# Patient Record
Sex: Female | Born: 1955 | Race: White | Hispanic: No | Smoking: Current every day smoker
Health system: Southern US, Community
[De-identification: ages and names within clinical notes are randomized; demographics above are authoritative.]

## PROBLEM LIST (undated history)

## (undated) DIAGNOSIS — R635 Abnormal weight gain: Secondary | ICD-10-CM

## (undated) DIAGNOSIS — E7800 Pure hypercholesterolemia, unspecified: Secondary | ICD-10-CM

## (undated) DIAGNOSIS — R000 Tachycardia, unspecified: Secondary | ICD-10-CM

## (undated) HISTORY — DX: Abnormal weight gain: R63.5

---

## 1987-07-23 HISTORY — PX: CHOLECYSTECTOMY: SHX55

## 1988-07-22 HISTORY — PX: ABDOMINAL HYSTERECTOMY: SHX81

## 2005-07-22 HISTORY — PX: ROTATOR CUFF REPAIR: SHX139

## 2009-07-22 HISTORY — PX: SKIN CANCER EXCISION: SHX779

## 2013-10-25 ENCOUNTER — Ambulatory Visit: Payer: Self-pay | Admitting: Family Medicine

## 2014-08-18 IMAGING — CR LEFT WRIST - COMPLETE 3+ VIEW
1 series · 4 of 4 positions shown · non-contrast
Comparison: None.

CLINICAL DATA: Pain and swelling.

EXAM:
LEFT WRIST - COMPLETE 3+ VIEW

[Series 1: pa · 0.17mm/px · 4 of 4 slices shown]
[im 1/4]
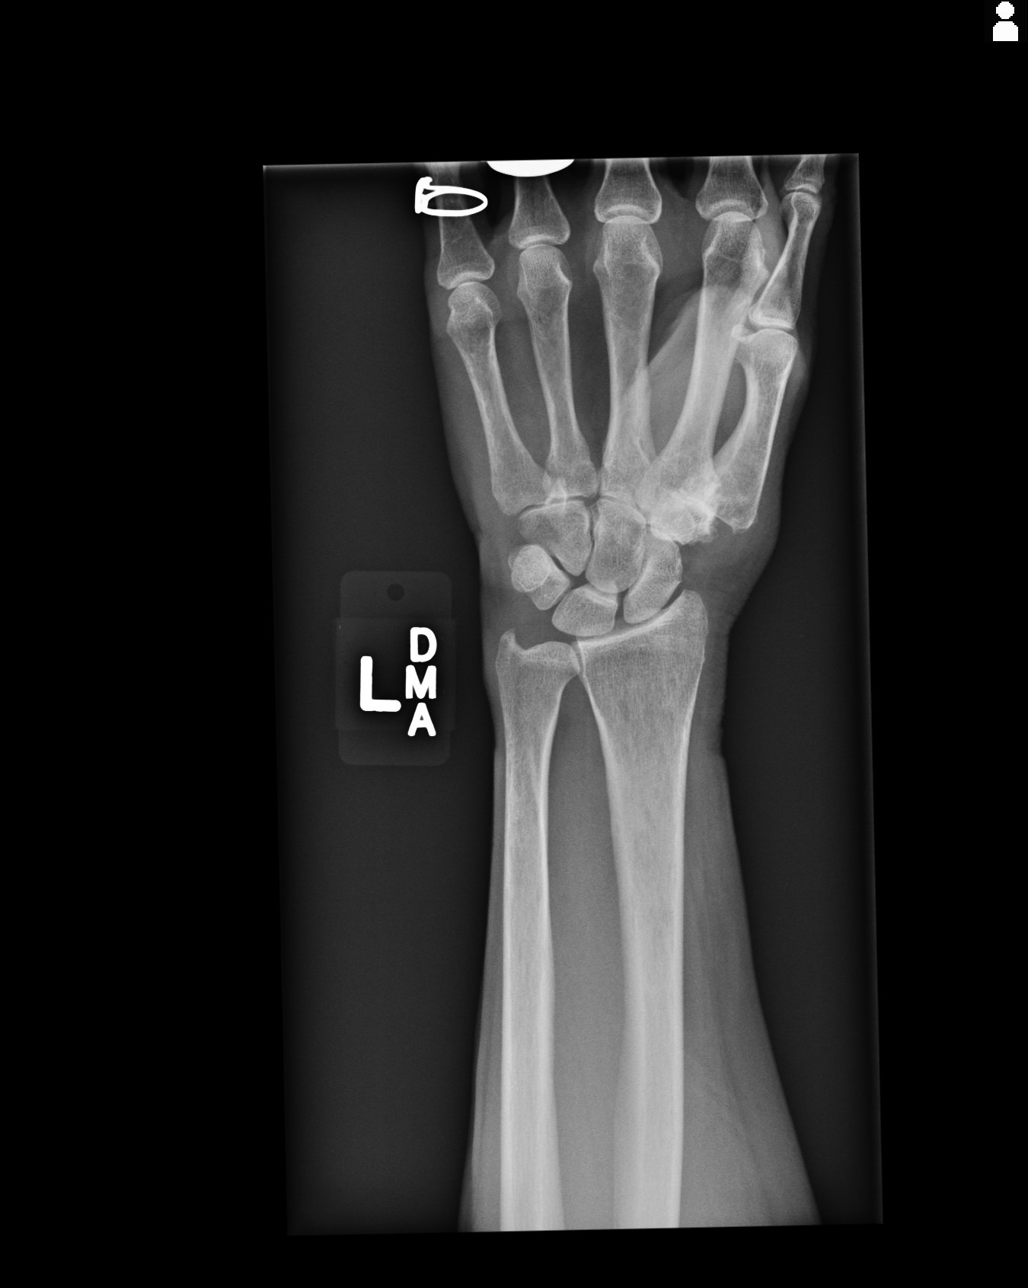
[im 2/4]
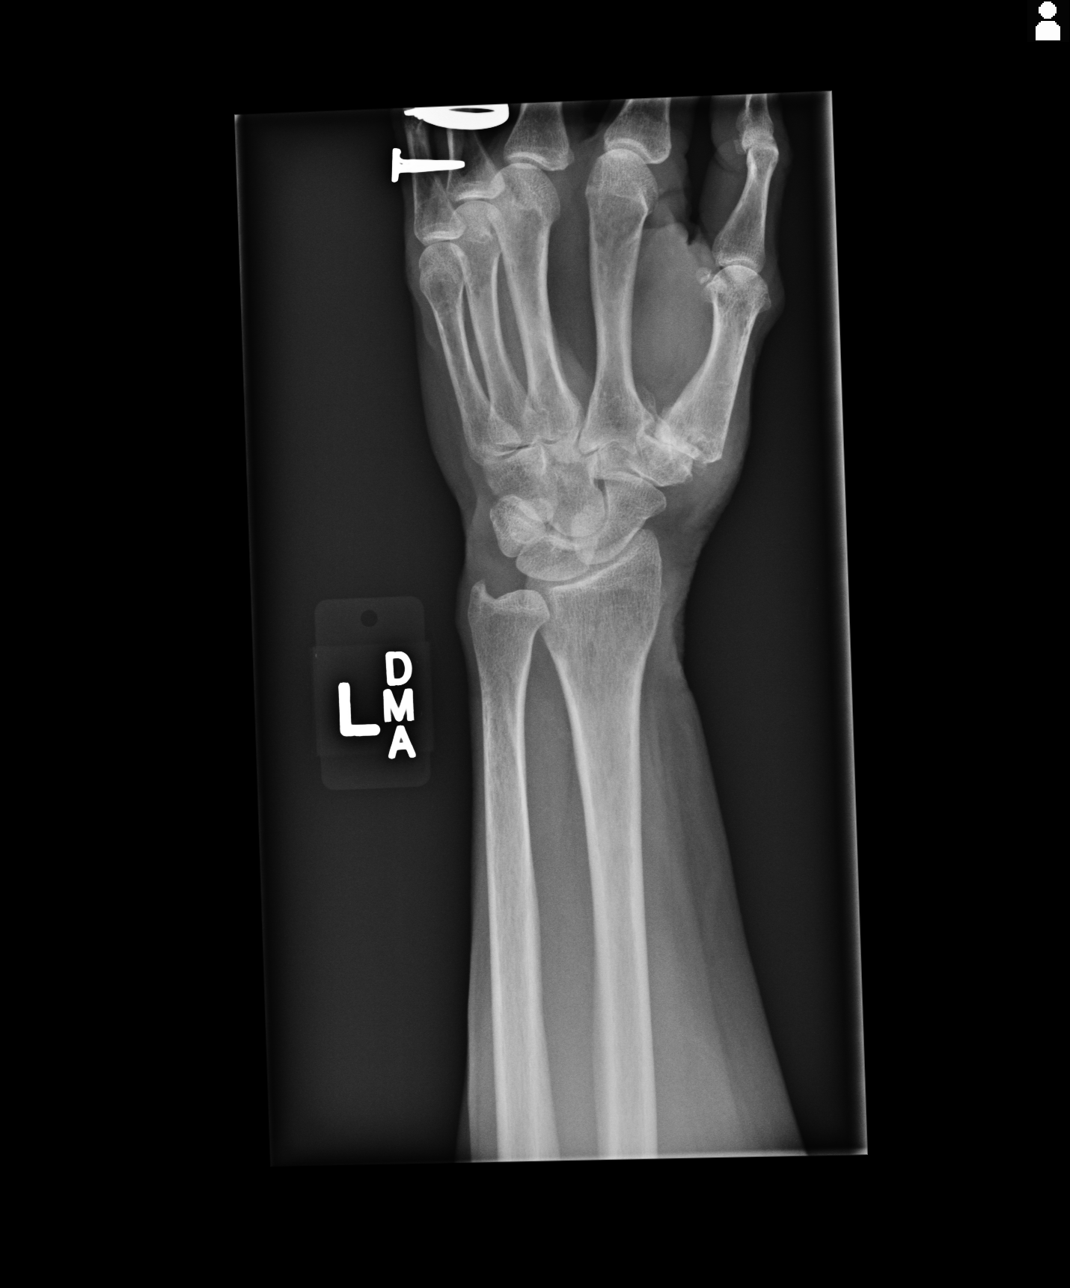
[im 3/4]
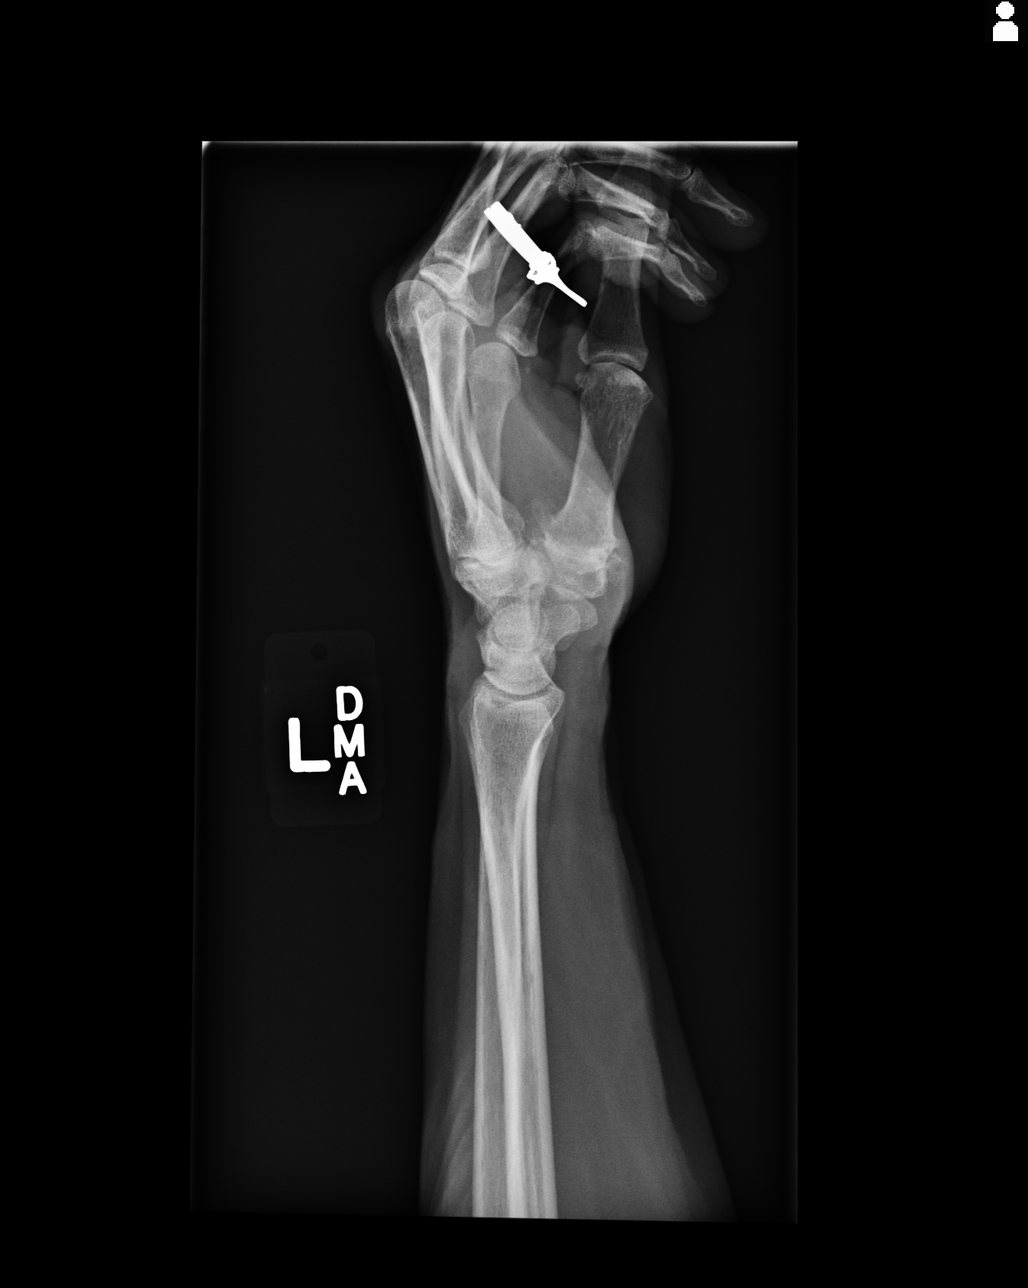
[im 4/4]
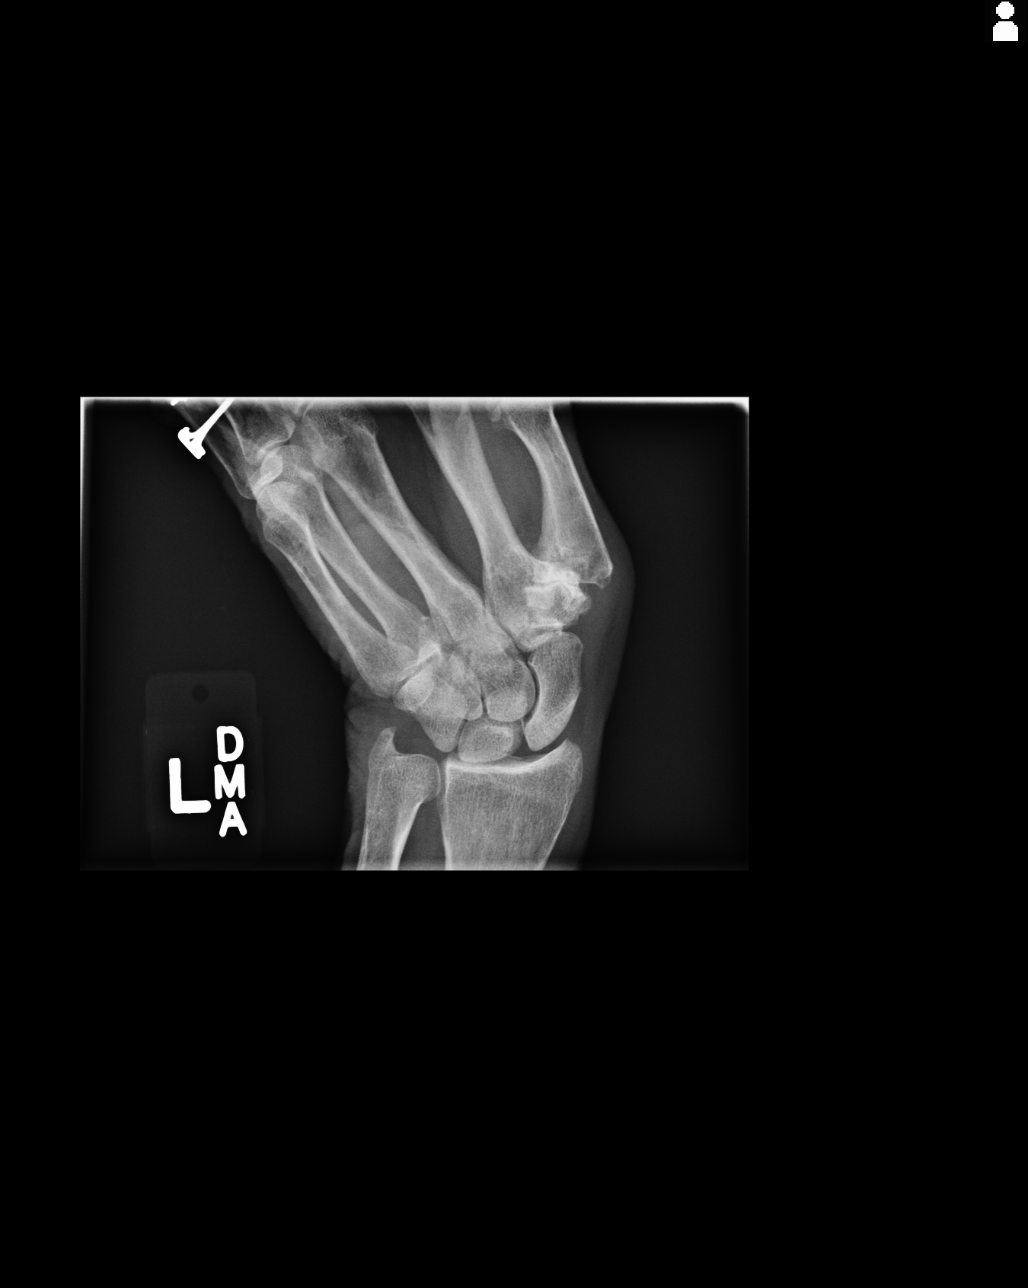

[4 of 4 positions shown; findings below may reference images not displayed]

FINDINGS: Prominent degenerative change noted in the first carpometacarpal
joint. No evidence of fracture or dislocation.
IMPRESSION: Severe degenerative changes first carpometacarpal joint. No acute
abnormality.

## 2015-01-16 DIAGNOSIS — R5383 Other fatigue: Secondary | ICD-10-CM | POA: Insufficient documentation

## 2015-01-16 DIAGNOSIS — L659 Nonscarring hair loss, unspecified: Secondary | ICD-10-CM | POA: Insufficient documentation

## 2015-01-16 DIAGNOSIS — E782 Mixed hyperlipidemia: Secondary | ICD-10-CM | POA: Insufficient documentation

## 2015-01-16 DIAGNOSIS — R635 Abnormal weight gain: Secondary | ICD-10-CM

## 2015-01-16 DIAGNOSIS — M129 Arthropathy, unspecified: Secondary | ICD-10-CM | POA: Insufficient documentation

## 2015-01-16 DIAGNOSIS — R946 Abnormal results of thyroid function studies: Secondary | ICD-10-CM | POA: Insufficient documentation

## 2015-01-16 DIAGNOSIS — I7300 Raynaud's syndrome without gangrene: Secondary | ICD-10-CM | POA: Insufficient documentation

## 2015-01-16 DIAGNOSIS — I10 Essential (primary) hypertension: Secondary | ICD-10-CM | POA: Insufficient documentation

## 2015-01-16 HISTORY — DX: Abnormal weight gain: R63.5

## 2015-04-11 ENCOUNTER — Encounter: Payer: Self-pay | Admitting: Family Medicine

## 2015-04-11 ENCOUNTER — Ambulatory Visit (INDEPENDENT_AMBULATORY_CARE_PROVIDER_SITE_OTHER): Payer: Self-pay | Admitting: Family Medicine

## 2015-04-11 VITALS — BP 122/76 | HR 79 | Temp 98.1°F | Resp 16 | Wt 187.4 lb

## 2015-04-11 DIAGNOSIS — E782 Mixed hyperlipidemia: Secondary | ICD-10-CM

## 2015-04-11 DIAGNOSIS — L930 Discoid lupus erythematosus: Secondary | ICD-10-CM

## 2015-04-11 DIAGNOSIS — E038 Other specified hypothyroidism: Secondary | ICD-10-CM

## 2015-04-11 DIAGNOSIS — E780 Pure hypercholesterolemia: Secondary | ICD-10-CM

## 2015-04-11 DIAGNOSIS — I10 Essential (primary) hypertension: Secondary | ICD-10-CM

## 2015-04-11 NOTE — Progress Notes (Signed)
Patient ID: Katelyn Bartlett, female   DOB: 08-20-1955, 59 y.o.   MRN: [id number]   Chief Complaint  Patient presents with  . Hypertension  . Hyperlipidemia  . Hypothyroidism    Subjective:  Hypertension The problem is unchanged. The problem is controlled. Agents associated with hypertension include thyroid hormones. Risk factors for coronary artery disease include smoking/tobacco exposure and stress. Past treatments include beta blockers and calcium channel blockers. There are no compliance problems.   Hyperlipidemia The problem is controlled. Exacerbating diseases include hypothyroidism. Factors aggravating her hyperlipidemia include smoking and beta blockers. Current antihyperlipidemic treatment includes exercise and statins. There are no compliance problems.  Risk factors for coronary artery disease include hypertension.  Hypothyroidism Still on the Levothyroxine 25 mg qd and energy level better. Has gained 6 lbs since last OV in May 2016. Still noting thinning of eyelashes, eyebrows and loss of axillary, arm and leg hair over the past 1 1/2 years. Has started walking 1 mile in 22 minutes every day over the past 3-4 months.   Prior to Admission medications   Medication Sig Start Date End Date Taking? Authorizing Provider  amLODipine (NORVASC) 5 MG tablet Take 1 tablet by mouth daily. 10/11/14  Yes Historical Provider, MD  aspirin 81 MG tablet Take 1 tablet by mouth daily.   Yes Historical Provider, MD  levothyroxine (SYNTHROID, LEVOTHROID) 25 MCG tablet Take 25 mcg by mouth daily before breakfast.   Yes Historical Provider, MD  metoprolol tartrate (LOPRESSOR) 25 MG tablet Take 1 tablet by mouth daily. 10/11/14  Yes Historical Provider, MD  MULTIPLE VITAMIN PO Take 1 tablet by mouth daily.   Yes Historical Provider, MD  naproxen sodium (ALEVE) 220 MG tablet Take 1 tablet by mouth as needed.    Yes Historical Provider, MD  OMEGA-3 FATTY ACIDS PO Take 1 capsule by mouth daily.   Yes  Historical Provider, MD  pravastatin (PRAVACHOL) 40 MG tablet Take 1 tablet by mouth daily. 10/11/14  Yes Historical Provider, MD   Patient Active Problem List   Diagnosis Date Noted  . Arthropathia 01/16/2015  . Alopecia 01/16/2015  . History of prolonged Q-T interval on ECG 01/16/2015  . 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 Benign hypertension 01/16/2015  . Abnormal results of thyroid function studies 01/16/2015  . Abnormal weight gain 01/16/2015  . Raynaud's syndrome without gangrene 01/16/2015  . Fatigue 01/16/2015   Past Surgical History  Procedure Laterality Date  . Abdominal hysterectomy  1990  . Cholecystectomy  1989  . Rotator cuff repair Right 2007  . Skin cancer excision  2011    ABOVE LIP   Family History  Problem Relation Age of Onset  . Adopted: Yes  . Family history unknown: Yes   Social History   Social History  . Marital Status: Widowed    Spouse Name: N/A  . Number of Children: N/A  . Years of Education: N/A   Occupational History  . Not on file.   Social History Main Topics  . Smoking status: Current Every Day Smoker -- 1.00 packs/day for 30 years    Types: Cigarettes  . Smokeless tobacco: Not on file  . Alcohol Use: No  . Drug Use: No  . Sexual Activity: Not on file   Other Topics Concern  . Not on file   Social History Narrative   Allergies  Allergen Reactions  . Morphine Sulfate     BP drops   Review of Systems  Constitutional: Negative.  HENT: Negative.   Eyes: Negative.   Respiratory: Negative.   Cardiovascular: Negative.   Gastrointestinal: Positive for diarrhea.       Occasional loose stools without hematochezia or melena.  Genitourinary: Negative.   Musculoskeletal: Negative.   Skin: Negative.   Neurological: Negative.   Endo/Heme/Allergies: Negative.   Psychiatric/Behavioral: Negative.    Objective:  BP 122/76 mmHg  Pulse 79  Temp(Src) 98.1 F (36.7 C) (Oral)  Resp 16  Wt 187 lb 6.4  oz (85.004 kg)  SpO2 95%  Physical Exam  Constitutional: She is oriented to person, place, and time and well-developed, well-nourished, and in no distress.  HENT:  Head: Normocephalic and atraumatic.  Mouth/Throat: Oropharynx is clear and moist.  Eyes: Conjunctivae and EOM are normal.  Neck: Normal range of motion. Neck supple. No thyromegaly present.  Cardiovascular: Normal rate and regular rhythm.   Pulmonary/Chest: Effort normal and breath sounds normal.  Abdominal: Bowel sounds are normal.  Musculoskeletal: Normal range of motion.  Neurological: She is alert and oriented to person, place, and time.  Skin:  Thinning of hair on arms, legs, axillae and loss of eyelashes and eyebrows. No scalp hair loss. Had a lesion biopsied off her left elbow that was a tumid lupus erythematosus lesion.  Psychiatric: Memory, affect and judgment normal.   Assessment and Plan :  1. Benign hypertension Well controlled. Tolerating Amlodipine 5 mg qd and Metoprolol 25 mg qd without side effects. Will check routine labs and follow up pending reports. - COMPLETE METABOLIC PANEL WITH GFR - CBC with Differential/Platelet  2. Combined fat and carbohydrate induced hyperlipemia Has gained 6 lbs since last OV May 2016. Tolerating Pravastatin 40 mg qd and trying to watch diet. Will recheck lipids to check progress. 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 Worried this could be systemic and a cause of joint pain or recent hair loss symptoms. Will check ANA. - ANA w/Reflex  4. Other specified hypothyroidism Tolerating Levothyroxine 25 mcg qd. Denies palpitations, chest pain or weight loss. Still very concerned about hair loss in axillae, legs, arms and thinning of eyelashes. No scalp hair loss noted. Will recheck thyroid panel. Wanted to postpone endocrinology referral and try the Levothyroxine first. -  T3, free - T4 - TSH - Thyroid antibodies   Vernie Murders [facility] 04/11/2015 8:18 AM

## 2015-04-13 LAB — COMPREHENSIVE METABOLIC PANEL
ALK PHOS: 71 IU/L (ref 39–117)
ALT: 34 IU/L — AB (ref 0–32)
AST: 29 IU/L (ref 0–40)
Albumin/Globulin Ratio: 1.8 (ref 1.1–2.5)
Albumin: 4.4 g/dL (ref 3.6–4.8)
BILIRUBIN TOTAL: 0.3 mg/dL (ref 0.0–1.2)
BUN/Creatinine Ratio: 26 (ref 11–26)
BUN: 17 mg/dL (ref 8–27)
CHLORIDE: 102 mmol/L (ref 97–108)
CO2: 22 mmol/L (ref 18–29)
Calcium: 9.5 mg/dL (ref 8.7–10.3)
Creatinine, Ser: 0.65 mg/dL (ref 0.57–1.00)
GFR calc Af Amer: 112 mL/min/{1.73_m2} (ref >59)
GFR calc non Af Amer: 97 mL/min/{1.73_m2} (ref >59)
GLUCOSE: 114 mg/dL — AB (ref 65–99)
Globulin, Total: 2.4 g/dL (ref 1.5–4.5)
Potassium: 4.2 mmol/L (ref 3.5–5.2)
Sodium: 141 mmol/L (ref 134–144)
Total Protein: 6.8 g/dL (ref 6.0–8.5)

## 2015-04-13 LAB — CBC WITH DIFFERENTIAL/PLATELET
Basophils Absolute: 0 10*3/uL (ref 0.0–0.2)
Basos: 1 %
EOS (ABSOLUTE): 0.1 10*3/uL (ref 0.0–0.4)
Eos: 2 %
Hematocrit: 44.1 % (ref 34.0–46.6)
Hemoglobin: 15.3 g/dL (ref 11.1–15.9)
IMMATURE GRANS (ABS): 0 10*3/uL (ref 0.0–0.1)
IMMATURE GRANULOCYTES: 0 %
Lymphocytes Absolute: 2.1 10*3/uL (ref 0.7–3.1)
Lymphs: 34 %
MCH: 31.7 pg (ref 26.6–33.0)
MCHC: 34.7 g/dL (ref 31.5–35.7)
MCV: 92 fL (ref 79–97)
MONOCYTES: 11 %
Monocytes Absolute: 0.7 10*3/uL (ref 0.1–0.9)
NEUTROS ABS: 3.4 10*3/uL (ref 1.4–7.0)
NEUTROS PCT: 52 %
PLATELETS: 255 10*3/uL (ref 150–379)
RBC: 4.82 x10E6/uL (ref 3.77–5.28)
RDW: 13.9 % (ref 12.3–15.4)
WBC: 6.4 10*3/uL (ref 3.4–10.8)

## 2015-04-13 LAB — THYROID ANTIBODIES: Thyroperoxidase Ab SerPl-aCnc: 8 IU/mL (ref 0–34)

## 2015-04-13 LAB — ANA W/REFLEX: ANA: NEGATIVE

## 2015-04-13 LAB — LIPID PANEL
CHOLESTEROL TOTAL: 170 mg/dL (ref 100–199)
Chol/HDL Ratio: 4.7 ratio units — ABNORMAL HIGH (ref 0.0–4.4)
HDL: 36 mg/dL — AB (ref >39)
LDL CALC: 101 mg/dL — AB (ref 0–99)
TRIGLYCERIDES: 165 mg/dL — AB (ref 0–149)
VLDL Cholesterol Cal: 33 mg/dL (ref 5–40)

## 2015-04-13 LAB — T4: T4, Total: 7.4 ug/dL (ref 4.5–12.0)

## 2015-04-13 LAB — TSH: TSH: 4.29 u[IU]/mL (ref 0.450–4.500)

## 2015-04-13 LAB — T3, FREE: T3, Free: 3.4 pg/mL (ref 2.0–4.4)

## 2015-04-18 ENCOUNTER — Telehealth: Payer: Self-pay

## 2015-04-18 DIAGNOSIS — R7989 Other specified abnormal findings of blood chemistry: Secondary | ICD-10-CM

## 2015-04-18 NOTE — Telephone Encounter (Signed)
Patient advised as directed below. Patient verbalized understanding. Patient agrees to proceed with endocrinology referral. Referral ordered.

## 2015-04-18 NOTE — Telephone Encounter (Signed)
-----   Message from Margo Common, [place] sent at 04/14/2015  5:41 PM EDT ----- All blood tests normal except triglycerides slightly up and HDL slightly down. If desired, will refer to an endocrinologist for evaluation of thyroid and hair loss.

## 2015-04-19 ENCOUNTER — Telehealth: Payer: Self-pay | Admitting: Family Medicine

## 2015-04-19 DIAGNOSIS — I471 Supraventricular tachycardia: Secondary | ICD-10-CM

## 2015-04-19 NOTE — Telephone Encounter (Signed)
Medications changed in chart. Please review history of prolonged QT intervals on ECG.

## 2015-04-19 NOTE — Telephone Encounter (Signed)
Pt states some of the info on Mychart is incorrect.  Pt states she is taking Metoprolol Tartrate 73m should be  2 times a day (this is showing in the old system) and Aspirin should be 3249ma day.  History also states she has prolonged QT and she is not sure why this is on there. Pt is requesting a call back to discuss this and this have this changed.  CB#[phone]/MW

## 2015-04-21 DIAGNOSIS — I471 Supraventricular tachycardia: Secondary | ICD-10-CM | POA: Insufficient documentation

## 2015-04-21 NOTE — Telephone Encounter (Signed)
Changed medication and history in chart per Simona Huh. Patient is aware.

## 2015-04-25 ENCOUNTER — Encounter: Payer: Self-pay | Admitting: Family Medicine

## 2015-09-28 ENCOUNTER — Encounter: Payer: Self-pay | Admitting: Family Medicine

## 2015-10-16 ENCOUNTER — Encounter: Payer: Self-pay | Admitting: Family Medicine

## 2015-10-16 ENCOUNTER — Ambulatory Visit (INDEPENDENT_AMBULATORY_CARE_PROVIDER_SITE_OTHER): Payer: Self-pay | Admitting: Family Medicine

## 2015-10-16 VITALS — BP 118/82 | HR 76 | Temp 98.0°F | Resp 14 | Wt 179.4 lb

## 2015-10-16 DIAGNOSIS — L659 Nonscarring hair loss, unspecified: Secondary | ICD-10-CM

## 2015-10-16 DIAGNOSIS — I10 Essential (primary) hypertension: Secondary | ICD-10-CM

## 2015-10-16 DIAGNOSIS — Z0000 Encounter for general adult medical examination without abnormal findings: Secondary | ICD-10-CM

## 2015-10-16 DIAGNOSIS — E782 Mixed hyperlipidemia: Secondary | ICD-10-CM

## 2015-10-16 DIAGNOSIS — I471 Supraventricular tachycardia: Secondary | ICD-10-CM

## 2015-10-16 NOTE — Progress Notes (Signed)
Patient ID: Katelyn Bartlett, female   DOB: February 19, 1956, 60 y.o.   MRN: [id number]   Patient: Katelyn Bartlett Female    DOB: 09/08/1955   60 y.o.   MRN: [id number] Visit Date: 10/16/2015  Today's Provider: Vernie Murders, PA   Chief Complaint  Patient presents with  . Hyperlipidemia  . Hypertension  . Follow-up   Subjective:    HPI   Hypertension, follow-up:  BP Readings from Last 3 Encounters:  10/16/15 118/82  04/11/15 122/76  11/24/14 118/76    She was last seen for hypertension 6 months ago.  BP at that visit was 122/76. Management since that visit includes none .She reports good compliance with treatment. She is not having side effects.  She is exercising. She is not adherent to low salt diet.   Outside blood pressures are being checked. She is experiencing none.  Patient denies none.   Cardiovascular risk factors include dyslipidemia, hypertension and smoking/ tobacco exposure.  Use of agents associated with hypertension: none  ------------------------------------------------------------------------    Lipid/Cholesterol, Follow-up:   Last seen for this 6 months ago.  Management since that visit includes none.  Last Lipid Panel:    Component Value Date/Time   CHOL 170 04/12/2015 0857   TRIG 165* 04/12/2015 0857   HDL 36* 04/12/2015 0857   CHOLHDL 4.7* 04/12/2015 0857   LDLCALC 101* 04/12/2015 0857    She reports good compliance with treatment. She is not having side effects.   Wt Readings from Last 3 Encounters:  10/16/15 179 lb 6.4 oz (81.375 kg)  04/11/15 187 lb 6.4 oz (85.004 kg)  11/24/14 181 lb 6.4 oz (82.283 kg)    ------------------------------------------------------------------------  Patient Active Problem List   Diagnosis Date Noted  . Supraventricular tachycardia ([place]) 04/21/2015  . Arthropathia 01/16/2015  . Alopecia 01/16/2015  . 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 Benign hypertension 01/16/2015  .  Abnormal results of thyroid function studies 01/16/2015  . Abnormal weight gain 01/16/2015  . Raynaud's syndrome without gangrene 01/16/2015  . Fatigue 01/16/2015   Past Surgical History  Procedure Laterality Date  . Abdominal hysterectomy  1990  . Cholecystectomy  1989  . Rotator cuff repair Right 2007  . Skin cancer excision  2011    ABOVE LIP   Family History  Problem Relation Age of Onset  . Adopted: Yes  . Family history unknown: Yes   Previous Medications   AMLODIPINE (NORVASC) 5 MG TABLET    Take 1 tablet by mouth daily.   ASPIRIN 325 MG TABLET    Take 325 mg by mouth daily.   METOPROLOL TARTRATE (LOPRESSOR) 25 MG TABLET    Take 1 tablet by mouth 2 (two) times daily.    MULTIPLE VITAMIN PO    Take 1 tablet by mouth daily.   NAPROXEN SODIUM (ALEVE) 220 MG TABLET    Take 1 tablet by mouth as needed.    OMEGA-3 FATTY ACIDS PO    Take 1 capsule by mouth daily.   PRAVASTATIN (PRAVACHOL) 40 MG TABLET    Take 1 tablet by mouth daily.   Allergies  Allergen Reactions  . Morphine Sulfate     BP drops    Review of Systems  Constitutional: Negative.   HENT: Negative.   Eyes: Negative.   Respiratory: Negative.   Cardiovascular: Negative.   Gastrointestinal: Negative.   Endocrine: Negative.   Genitourinary: Negative.   Musculoskeletal: Negative.   Skin: Negative.   Allergic/Immunologic: Negative.   Neurological:  Negative.   Hematological: Negative.   Psychiatric/Behavioral: Negative.     Social History  Substance Use Topics  . Smoking status: Current Every Day Smoker -- 1.00 packs/day for 30 years    Types: Cigarettes  . Smokeless tobacco: Not on file  . Alcohol Use: No   Objective:   BP 118/82 mmHg  Pulse 76  Temp(Src) 98 F (36.7 C) (Oral)  Resp 14  Wt 179 lb 6.4 oz (81.375 kg)  Physical Exam  Constitutional: She is oriented to person, place, and time. She appears well-developed and well-nourished. No distress.  HENT:  Head: Normocephalic and atraumatic.   Right Ear: Hearing and external ear normal.  Left Ear: Hearing and external ear normal.  Nose: Nose normal.  Eyes: Conjunctivae and lids are normal. Right eye exhibits no discharge. Left eye exhibits no discharge. No scleral icterus.  Neck: Normal range of motion. Neck supple.  Cardiovascular: Normal rate and normal heart sounds.   Pulmonary/Chest: Effort normal. No respiratory distress.  Abdominal: Soft. Bowel sounds are normal.  Musculoskeletal: Normal range of motion.  Neurological: She is alert and oriented to person, place, and time.  Skin: Skin is intact. No lesion and no rash noted.  Psychiatric: She has a normal mood and affect. Her speech is normal and behavior is normal. Thought content normal.      Assessment & Plan:     1. Benign hypertension Stable and well controlled. Tolerating Metoprolol and Amlodipine without side effects. Recheck labs and follow up in 6 months. - CBC with Differential/Platelet  2. Combined fat and carbohydrate induced hyperlipemia No muscle pains with Pravastatin. Trying to control fats in diet. Recheck labs. - Comprehensive metabolic panel - Lipid panel  3. Alopecia Eyelashes grew back and no longer missing/falling out.  4. Supraventricular tachycardia ([place]) Stable and well controlled with Metoprolol 25 mg BID. Denies chest pain or palpitations. Snoring at night causing dry mouth in the morning (recorded herself). Trying nasal strips and mouth piece. May need to schedule for sleep study if persistent. - TSH    .

## 2015-10-17 LAB — CBC WITH DIFFERENTIAL/PLATELET
BASOS: 0 %
Basophils Absolute: 0 10*3/uL (ref 0.0–0.2)
EOS (ABSOLUTE): 0.1 10*3/uL (ref 0.0–0.4)
EOS: 2 %
HEMATOCRIT: 46.4 % (ref 34.0–46.6)
HEMOGLOBIN: 15.9 g/dL (ref 11.1–15.9)
IMMATURE GRANS (ABS): 0 10*3/uL (ref 0.0–0.1)
IMMATURE GRANULOCYTES: 0 %
LYMPHS: 29 %
Lymphocytes Absolute: 2.1 10*3/uL (ref 0.7–3.1)
MCH: 31.7 pg (ref 26.6–33.0)
MCHC: 34.3 g/dL (ref 31.5–35.7)
MCV: 92 fL (ref 79–97)
Monocytes Absolute: 0.7 10*3/uL (ref 0.1–0.9)
Monocytes: 9 %
NEUTROS ABS: 4.3 10*3/uL (ref 1.4–7.0)
NEUTROS PCT: 60 %
Platelets: 263 10*3/uL (ref 150–379)
RBC: 5.02 x10E6/uL (ref 3.77–5.28)
RDW: 13.6 % (ref 12.3–15.4)
WBC: 7.3 10*3/uL (ref 3.4–10.8)

## 2015-10-17 LAB — COMPREHENSIVE METABOLIC PANEL
A/G RATIO: 1.8 (ref 1.2–2.2)
ALBUMIN: 4.6 g/dL (ref 3.6–4.8)
ALT: 22 IU/L (ref 0–32)
AST: 17 IU/L (ref 0–40)
Alkaline Phosphatase: 72 IU/L (ref 39–117)
BILIRUBIN TOTAL: 0.4 mg/dL (ref 0.0–1.2)
BUN / CREAT RATIO: 29 — AB (ref 11–26)
BUN: 17 mg/dL (ref 8–27)
CALCIUM: 9.3 mg/dL (ref 8.7–10.3)
CHLORIDE: 102 mmol/L (ref 96–106)
CO2: 23 mmol/L (ref 18–29)
Creatinine, Ser: 0.59 mg/dL (ref 0.57–1.00)
GFR, EST AFRICAN AMERICAN: 115 mL/min/{1.73_m2} (ref >59)
GFR, EST NON AFRICAN AMERICAN: 100 mL/min/{1.73_m2} (ref >59)
GLOBULIN, TOTAL: 2.6 g/dL (ref 1.5–4.5)
Glucose: 104 mg/dL — ABNORMAL HIGH (ref 65–99)
POTASSIUM: 4.2 mmol/L (ref 3.5–5.2)
Sodium: 141 mmol/L (ref 134–144)
TOTAL PROTEIN: 7.2 g/dL (ref 6.0–8.5)

## 2015-10-17 LAB — TSH: TSH: 4.3 u[IU]/mL (ref 0.450–4.500)

## 2015-10-17 LAB — LIPID PANEL
CHOL/HDL RATIO: 4.5 ratio — AB (ref 0.0–4.4)
Cholesterol, Total: 202 mg/dL — ABNORMAL HIGH (ref 100–199)
HDL: 45 mg/dL (ref >39)
LDL Calculated: 122 mg/dL — ABNORMAL HIGH (ref 0–99)
Triglycerides: 174 mg/dL — ABNORMAL HIGH (ref 0–149)
VLDL Cholesterol Cal: 35 mg/dL (ref 5–40)

## 2015-10-22 ENCOUNTER — Other Ambulatory Visit: Payer: Self-pay | Admitting: Family Medicine

## 2015-10-24 ENCOUNTER — Telehealth: Payer: Self-pay

## 2015-10-24 NOTE — Telephone Encounter (Signed)
-----   Message from [place], [place] sent at 10/23/2015  5:35 PM EDT ----- Cholesterol and triglycerides so much worse, need to recheck in 3-4 months to be sure medications don't need adjusting further. Work on diet and exercise level.

## 2015-10-24 NOTE — Telephone Encounter (Signed)
Contacted patient. Patient states she was advised of lab results on 10/23/2015.

## 2016-02-13 ENCOUNTER — Ambulatory Visit (INDEPENDENT_AMBULATORY_CARE_PROVIDER_SITE_OTHER): Payer: Self-pay | Admitting: Family Medicine

## 2016-02-13 ENCOUNTER — Encounter: Payer: Self-pay | Admitting: Family Medicine

## 2016-02-13 VITALS — BP 102/64 | HR 62 | Temp 98.3°F | Resp 14 | Wt 171.0 lb

## 2016-02-13 DIAGNOSIS — E782 Mixed hyperlipidemia: Secondary | ICD-10-CM

## 2016-02-13 DIAGNOSIS — I10 Essential (primary) hypertension: Secondary | ICD-10-CM

## 2016-02-13 DIAGNOSIS — Z0000 Encounter for general adult medical examination without abnormal findings: Secondary | ICD-10-CM

## 2016-02-13 DIAGNOSIS — I471 Supraventricular tachycardia: Secondary | ICD-10-CM

## 2016-02-13 NOTE — Progress Notes (Signed)
Patient: Katelyn Bartlett Female    DOB: [birth year]   60 y.o.   MRN: [id number] Visit Date: 02/13/2016  Today's Provider: Vernie Murders, PA   Chief Complaint  Patient presents with  . Hypertension  . Hyperlipidemia   Subjective:    HPI  Patient is here for 4 months follow up.  Hypertension: Patient does not check her b/p unless she does not feel well. No cardiac symptoms. BP Readings from Last 3 Encounters:  02/13/16 102/64  10/16/15 118/82  04/11/15 122/76   Hyperlipidemia: After reviewing last lab results advised patient to work on her diet and exercise. She has added in her diet things like salmon, walnuts and almonds. She added another Fish oil tablet to take daily and has been walking in the evenings. She has lost 9 lbs since her last visit with [place] per our scale. Lab Results  Component Value Date   CHOL 202 (H) 10/16/2015   HDL 45 10/16/2015   LDLCALC 122 (H) 10/16/2015   TRIG 174 (H) 10/16/2015   CHOLHDL 4.5 (H) 10/16/2015      Wt Readings from Last 3 Encounters:  02/13/16 171 lb (77.6 kg)  10/16/15 179 lb 6.4 oz (81.4 kg)  04/11/15 187 lb 6.4 oz (85 kg)   Patient Active Problem List   Diagnosis Date Noted  . Supraventricular tachycardia ([place]) 04/21/2015  . Arthropathia 01/16/2015  . Alopecia 01/16/2015  . 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 Benign hypertension 01/16/2015  . Abnormal results of thyroid function studies 01/16/2015  . Abnormal weight gain 01/16/2015  . Raynaud's syndrome without gangrene 01/16/2015  . Fatigue 01/16/2015   Past Surgical History:  Procedure Laterality Date  . ABDOMINAL HYSTERECTOMY  1990  . CHOLECYSTECTOMY  1989  . ROTATOR CUFF REPAIR Right 2007  . SKIN CANCER EXCISION  2011   ABOVE LIP   Family History  Problem Relation Age of Onset  . Adopted: Yes  . Family history unknown: Yes   Allergies  Allergen Reactions  . Morphine Sulfate     BP drops   Current Meds  Medication Sig    . amLODipine (NORVASC) 5 MG tablet TAKE 1 TABLET BY MOUTH DAILY  . aspirin 325 MG tablet Take 325 mg by mouth daily.  . metoprolol tartrate (LOPRESSOR) 25 MG tablet TAKE 1 TABLET BY MOUTH TWICE DAILY  . MULTIPLE VITAMIN PO Take 1 tablet by mouth daily.  . naproxen sodium (ALEVE) 220 MG tablet Take 1 tablet by mouth as needed.   . OMEGA-3 FATTY ACIDS PO Take 1 capsule by mouth daily.  . pravastatin (PRAVACHOL) 40 MG tablet TAKE 1 TABLET BY MOUTH DAILY    Review of Systems  Constitutional: Negative.   Respiratory: Negative.   Cardiovascular: Negative.   Gastrointestinal: Negative.        Loose stools at lunch every couple days.  Musculoskeletal: Negative.     Social History  Substance Use Topics  . Smoking status: Current Every Day Smoker    Packs/day: 0.50    Years: 30.00    Types: Cigarettes  . Smokeless tobacco: Never Used  . Alcohol use No   Objective:   BP 102/64 (BP Location: Right Arm, Patient Position: Sitting, Cuff Size: Normal)   Pulse 62   Temp 98.3 F (36.8 C)   Resp 14   Wt 171 lb (77.6 kg)   BMI 26.78 kg/m  Wt Readings from Last 3 Encounters:  02/13/16 171 lb (77.6 kg)  10/16/15 179 lb 6.4 oz (81.4 kg)  04/11/15 187 lb 6.4 oz (85 kg)    Physical Exam  Constitutional: She is oriented to person, place, and time. She appears well-developed and well-nourished. No distress.  HENT:  Head: Normocephalic and atraumatic.  Right Ear: Hearing normal.  Left Ear: Hearing normal.  Nose: Nose normal.  Eyes: Conjunctivae and lids are normal. Right eye exhibits no discharge. Left eye exhibits no discharge. No scleral icterus.  Neck: Neck supple.  Cardiovascular: Normal rate and regular rhythm.   Pulmonary/Chest: Effort normal. No respiratory distress.  Abdominal: Soft. Bowel sounds are normal.  Musculoskeletal: Normal range of motion.  Neurological: She is alert and oriented to person, place, and time.  Skin: Skin is intact. No lesion noted.  Psychiatric: She has  a normal mood and affect. Her speech is normal and behavior is normal. Thought content normal.      Assessment & Plan:     1. Benign hypertension Good control with continued use of Amlodipine and Metoprolol. No side effects. Recheck labs and follow up pending reports. - CBC with Differential/Platelet - Comprehensive metabolic panel  2. Combined fat and carbohydrate induced hyperlipemia Tolerating Pravastatin and Omega-3 Fish Oil without GI upset. Has lost 8-9 lbs since last OV. Continue diet and exercise. Recheck labs. - Comprehensive metabolic panel - Lipid panel - TSH  3. Supraventricular tachycardia ([facility]) No significant palpitations. Well controlled with Metoprolol.  - CBC with Differential/Platelet - Comprehensive metabolic panel - TSH       Vernie Murders, PA  [facility]

## 2016-02-14 LAB — CBC WITH DIFFERENTIAL/PLATELET
BASOS: 1 %
Basophils Absolute: 0 10*3/uL (ref 0.0–0.2)
EOS (ABSOLUTE): 0.1 10*3/uL (ref 0.0–0.4)
EOS: 1 %
HEMOGLOBIN: 15.7 g/dL (ref 11.1–15.9)
Hematocrit: 46.5 % (ref 34.0–46.6)
IMMATURE GRANULOCYTES: 0 %
Immature Grans (Abs): 0 10*3/uL (ref 0.0–0.1)
Lymphocytes Absolute: 2 10*3/uL (ref 0.7–3.1)
Lymphs: 32 %
MCH: 31.8 pg (ref 26.6–33.0)
MCHC: 33.8 g/dL (ref 31.5–35.7)
MCV: 94 fL (ref 79–97)
MONOCYTES: 9 %
MONOS ABS: 0.5 10*3/uL (ref 0.1–0.9)
NEUTROS PCT: 57 %
Neutrophils Absolute: 3.6 10*3/uL (ref 1.4–7.0)
Platelets: 252 10*3/uL (ref 150–379)
RBC: 4.94 x10E6/uL (ref 3.77–5.28)
RDW: 13.4 % (ref 12.3–15.4)
WBC: 6.2 10*3/uL (ref 3.4–10.8)

## 2016-02-14 LAB — LIPID PANEL
CHOLESTEROL TOTAL: 139 mg/dL (ref 100–199)
Chol/HDL Ratio: 4.3 ratio units (ref 0.0–4.4)
HDL: 32 mg/dL — ABNORMAL LOW (ref >39)
LDL CALC: 82 mg/dL (ref 0–99)
TRIGLYCERIDES: 125 mg/dL (ref 0–149)
VLDL Cholesterol Cal: 25 mg/dL (ref 5–40)

## 2016-02-14 LAB — COMPREHENSIVE METABOLIC PANEL
ALK PHOS: 67 IU/L (ref 39–117)
ALT: 29 IU/L (ref 0–32)
AST: 26 IU/L (ref 0–40)
Albumin/Globulin Ratio: 1.9 (ref 1.2–2.2)
Albumin: 4.6 g/dL (ref 3.6–4.8)
BILIRUBIN TOTAL: 0.5 mg/dL (ref 0.0–1.2)
BUN/Creatinine Ratio: 24 (ref 12–28)
BUN: 15 mg/dL (ref 8–27)
CHLORIDE: 102 mmol/L (ref 96–106)
CO2: 24 mmol/L (ref 18–29)
CREATININE: 0.63 mg/dL (ref 0.57–1.00)
Calcium: 9.6 mg/dL (ref 8.7–10.3)
GFR calc Af Amer: 112 mL/min/{1.73_m2} (ref >59)
GFR calc non Af Amer: 97 mL/min/{1.73_m2} (ref >59)
GLOBULIN, TOTAL: 2.4 g/dL (ref 1.5–4.5)
GLUCOSE: 91 mg/dL (ref 65–99)
POTASSIUM: 4.2 mmol/L (ref 3.5–5.2)
SODIUM: 142 mmol/L (ref 134–144)
Total Protein: 7 g/dL (ref 6.0–8.5)

## 2016-02-14 LAB — TSH: TSH: 2.81 u[IU]/mL (ref 0.450–4.500)

## 2016-02-20 ENCOUNTER — Telehealth: Payer: Self-pay | Admitting: Family Medicine

## 2016-02-20 NOTE — Telephone Encounter (Signed)
Pt is calling back regarding her cholesterol labs.  She would like a nurse to call her back.  Her call back is [phone]  Thanks Con Memos

## 2016-02-23 NOTE — Telephone Encounter (Signed)
Left message to call back.   Notes Recorded by Margo Common, PA on 02/16/2016 at 6:11 PM EDT All blood tests in good shape. Cholesterol and triglycerides back down. HDL (good cholesterol) is a little low. Continue Pravastatin and switch Fish Oil to [employer] (Mega-Red) one daily. Recheck levels in 6 months.

## 2016-02-26 NOTE — Telephone Encounter (Signed)
Patient advised as below.  

## 2016-04-15 ENCOUNTER — Ambulatory Visit: Payer: Self-pay | Admitting: Family Medicine

## 2016-04-26 ENCOUNTER — Encounter: Payer: Self-pay | Admitting: Family Medicine

## 2016-04-26 ENCOUNTER — Other Ambulatory Visit: Payer: Self-pay | Admitting: Family Medicine

## 2016-04-26 ENCOUNTER — Telehealth: Payer: Self-pay

## 2016-04-26 ENCOUNTER — Ambulatory Visit (INDEPENDENT_AMBULATORY_CARE_PROVIDER_SITE_OTHER): Payer: Self-pay | Admitting: Family Medicine

## 2016-04-26 ENCOUNTER — Ambulatory Visit
Admission: RE | Admit: 2016-04-26 | Discharge: 2016-04-26 | Disposition: A | Discharge status: Home / Self Care | Payer: Self-pay | Source: Ambulatory Visit | Attending: Family Medicine | Admitting: Family Medicine

## 2016-04-26 VITALS — BP 132/86 | HR 68 | Temp 98.1°F | Resp 14 | Wt 169.4 lb

## 2016-04-26 DIAGNOSIS — G8929 Other chronic pain: Secondary | ICD-10-CM

## 2016-04-26 DIAGNOSIS — M545 Low back pain, unspecified: Secondary | ICD-10-CM

## 2016-04-26 DIAGNOSIS — M4186 Other forms of scoliosis, lumbar region: Secondary | ICD-10-CM | POA: Insufficient documentation

## 2016-04-26 DIAGNOSIS — M5136 Other intervertebral disc degeneration, lumbar region: Secondary | ICD-10-CM | POA: Insufficient documentation

## 2016-04-26 DIAGNOSIS — M129 Arthropathy, unspecified: Secondary | ICD-10-CM

## 2016-04-26 IMAGING — CR DG LUMBAR SPINE COMPLETE 4+V
1 series · 6 of 6 positions shown · non-contrast
Comparison: None.

CLINICAL DATA: Chronic low back pain.  No known injury.

EXAM:
LUMBAR SPINE - COMPLETE 4+ VIEW

[Series 1: dg lumbar spine complete 4 +v · 0.14mm/px · 6 of 6 slices shown]
[im 1/6]
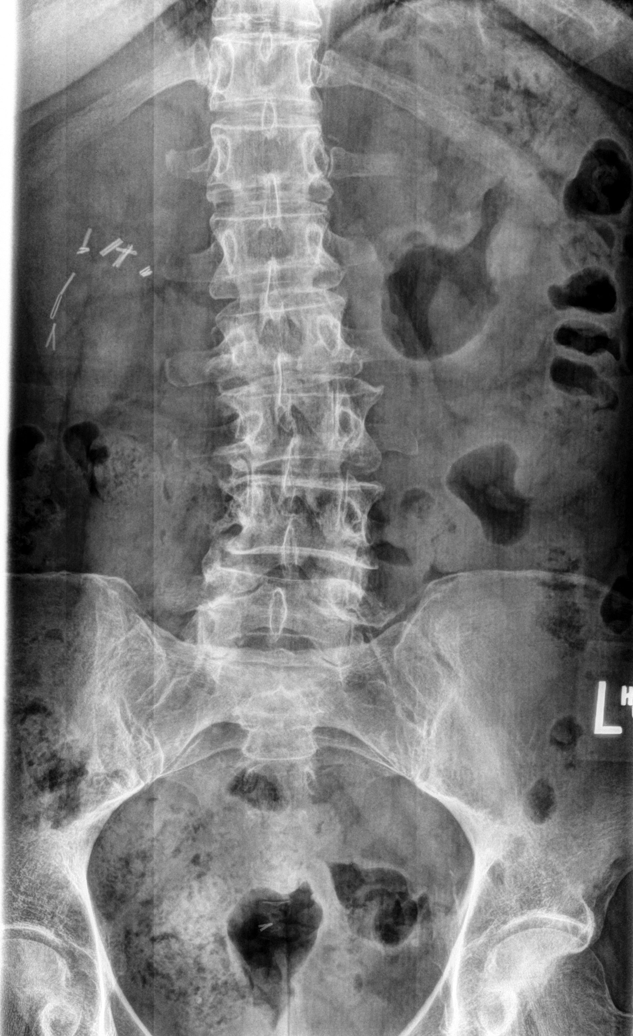
[im 2/6]
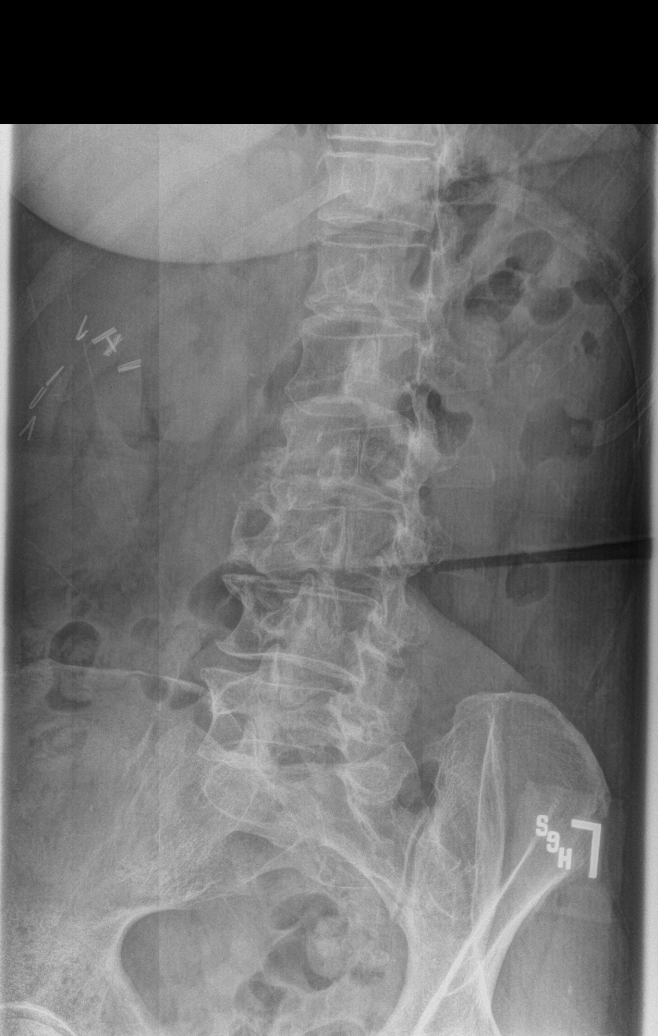
[im 3/6]
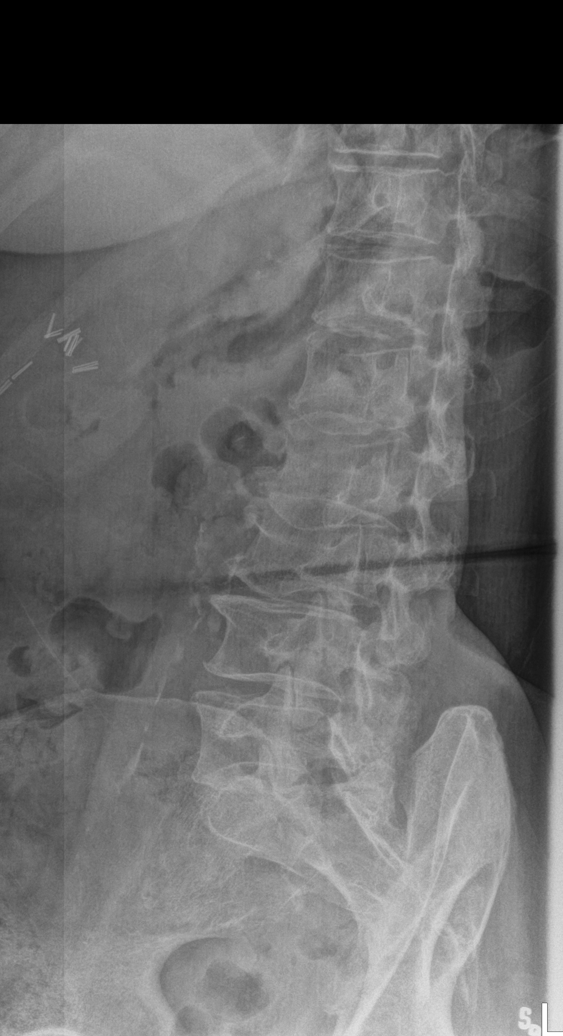
[im 4/6]
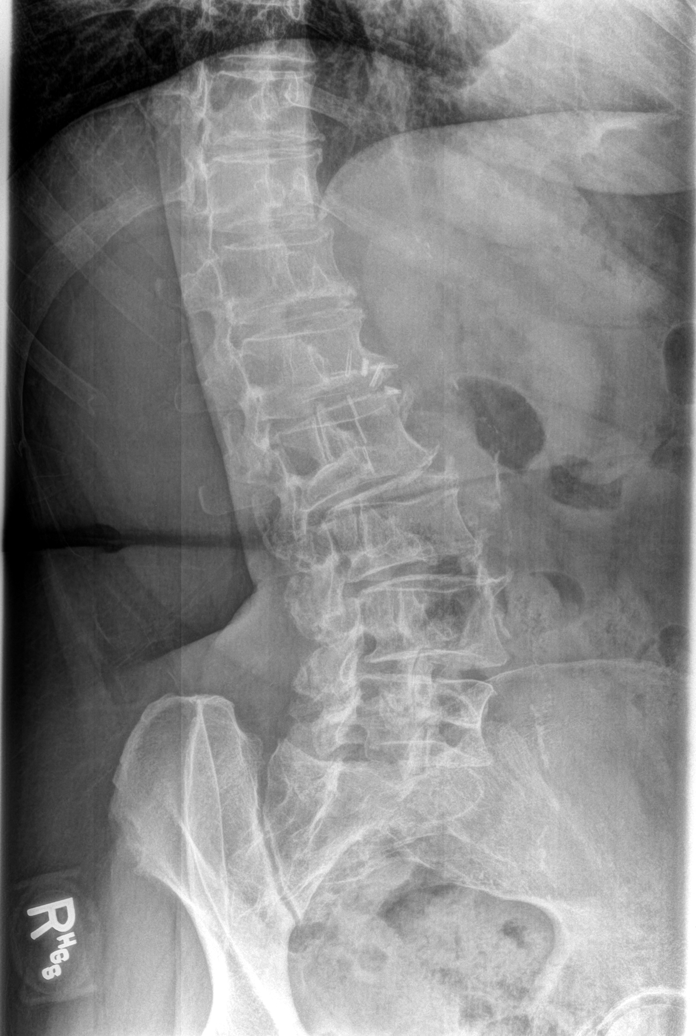
[im 5/6]
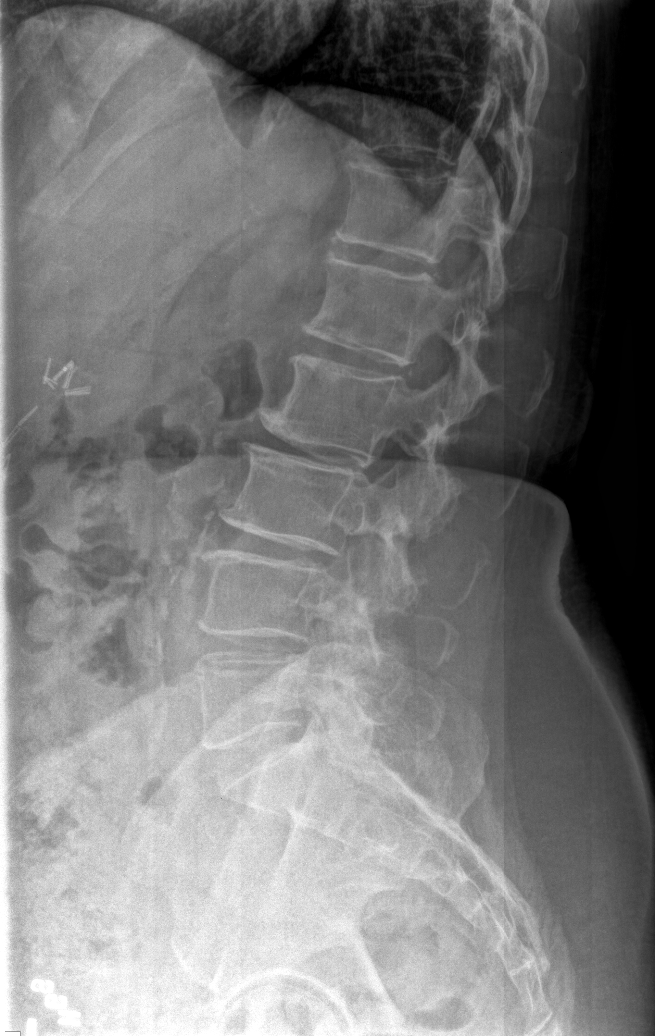
[im 6/6]
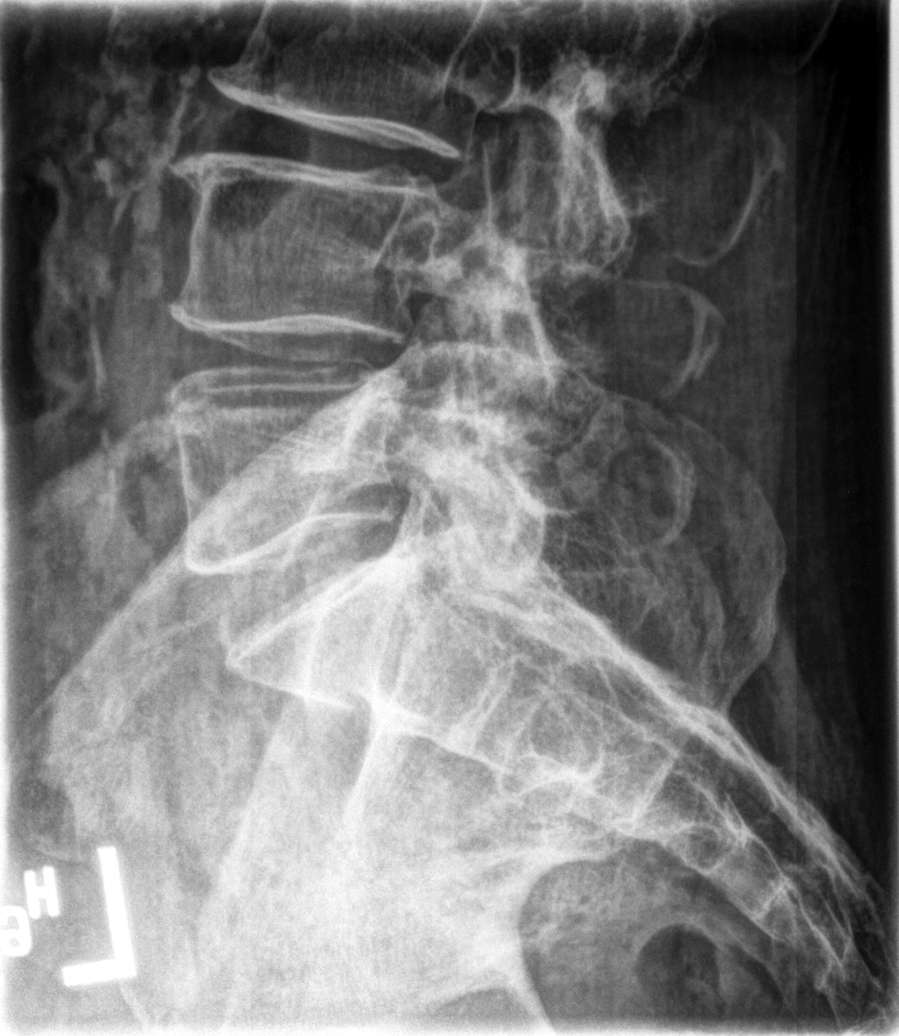

[6 of 6 positions shown; findings below may reference images not displayed]

FINDINGS: Mild levoscoliosis in the mid lumbar spine. Degenerative spurring
throughout the lumbar spine. Mild degenerative facet disease in the
mid and lower lumbar spine. No fracture. SI joints are symmetric and
unremarkable.
IMPRESSION: Mild levoscoliosis and degenerative changes.  No acute findings.

## 2016-04-26 MED ORDER — CYCLOBENZAPRINE HCL 5 MG PO TABS: 5.0000 mg | ORAL_TABLET | Freq: Three times a day (TID) | ORAL | 1 refills | Status: DC | PRN

## 2016-04-26 MED ORDER — CELECOXIB 200 MG PO CAPS: 200.0000 mg | ORAL_CAPSULE | Freq: Two times a day (BID) | ORAL | 1 refills | Status: DC

## 2016-04-26 NOTE — Telephone Encounter (Signed)
-----   Message from Margo Common, [place] sent at 04/26/2016  9:52 AM EDT ----- X-ray confirms degenerative arthritis with some spurs and facet degeneration. Proceed with medications given and rehab exercises. Recheck appointment in 2-3 weeks if needed.

## 2016-04-26 NOTE — Patient Instructions (Signed)
Back Exercises The following exercises strengthen the muscles that help to support the back. They also help to keep the lower back flexible. Doing these exercises can help to prevent back pain or lessen existing pain. If you have back pain or discomfort, try doing these exercises 2-3 times each day or as told by your health care provider. When the pain goes away, do them once each day, but increase the number of times that you repeat the steps for each exercise (do more repetitions). If you do not have back pain or discomfort, do these exercises once each day or as told by your health care provider. EXERCISES Single Knee to Chest Repeat these steps 3-5 times for each leg: 1. Lie on your back on a firm bed or the floor with your legs extended. 2. Bring one knee to your chest. Your other leg should stay extended and in contact with the floor. 3. Hold your knee in place by grabbing your knee or thigh. 4. Pull on your knee until you feel a gentle stretch in your lower back. 5. Hold the stretch for 10-30 seconds. 6. Slowly release and straighten your leg. Pelvic Tilt Repeat these steps 5-10 times: 1. Lie on your back on a firm bed or the floor with your legs extended. 2. Bend your knees so they are pointing toward the ceiling and your feet are flat on the floor. 3. Tighten your lower abdominal muscles to press your lower back against the floor. This motion will tilt your pelvis so your tailbone points up toward the ceiling instead of pointing to your feet or the floor. 4. With gentle tension and even breathing, hold this position for 5-10 seconds. Cat-Cow Repeat these steps until your lower back becomes more flexible: 1. Get into a hands-and-knees position on a firm surface. Keep your hands under your shoulders, and keep your knees under your hips. You may place padding under your knees for comfort. 2. Let your head hang down, and point your tailbone toward the floor so your lower back becomes  rounded like the back of a cat. 3. Hold this position for 5 seconds. 4. Slowly lift your head and point your tailbone up toward the ceiling so your back forms a sagging arch like the back of a cow. 5. Hold this position for 5 seconds. Press-Ups Repeat these steps 5-10 times: 1. Lie on your abdomen (face-down) on the floor. 2. Place your palms near your head, about shoulder-width apart. 3. While you keep your back as relaxed as possible and keep your hips on the floor, slowly straighten your arms to raise the top half of your body and lift your shoulders. Do not use your back muscles to raise your upper torso. You may adjust the placement of your hands to make yourself more comfortable. 4. Hold this position for 5 seconds while you keep your back relaxed. 5. Slowly return to lying flat on the floor. Bridges Repeat these steps 10 times: 1. Lie on your back on a firm surface. 2. Bend your knees so they are pointing toward the ceiling and your feet are flat on the floor. 3. Tighten your buttocks muscles and lift your buttocks off of the floor until your waist is at almost the same height as your knees. You should feel the muscles working in your buttocks and the back of your thighs. If you do not feel these muscles, slide your feet 1-2 inches farther away from your buttocks. 4. Hold this position for 3-5  seconds. 5. Slowly lower your hips to the starting position, and allow your buttocks muscles to relax completely. If this exercise is too easy, try doing it with your arms crossed over your chest. Abdominal Crunches Repeat these steps 5-10 times: 1. Lie on your back on a firm bed or the floor with your legs extended. 2. Bend your knees so they are pointing toward the ceiling and your feet are flat on the floor. 3. Cross your arms over your chest. 4. Tip your chin slightly toward your chest without bending your neck. 5. Tighten your abdominal muscles and slowly raise your trunk (torso) high  enough to lift your shoulder blades a tiny bit off of the floor. Avoid raising your torso higher than that, because it can put too much stress on your low back and it does not help to strengthen your abdominal muscles. 6. Slowly return to your starting position. Back Lifts Repeat these steps 5-10 times: 1. Lie on your abdomen (face-down) with your arms at your sides, and rest your forehead on the floor. 2. Tighten the muscles in your legs and your buttocks. 3. Slowly lift your chest off of the floor while you keep your hips pressed to the floor. Keep the back of your head in line with the curve in your back. Your eyes should be looking at the floor. 4. Hold this position for 3-5 seconds. 5. Slowly return to your starting position. SEEK MEDICAL CARE IF:  Your back pain or discomfort gets much worse when you do an exercise.  Your back pain or discomfort does not lessen within 2 hours after you exercise. If you have any of these problems, stop doing these exercises right away. Do not do them again unless your health care provider says that you can. SEEK IMMEDIATE MEDICAL CARE IF:  You develop sudden, severe back pain. If this happens, stop doing the exercises right away. Do not do them again unless your health care provider says that you can.   This information is not intended to replace advice given to you by your health care provider. Make sure you discuss any questions you have with your health care provider.   Document Released: 08/15/2004 Document Revised: 03/29/2015 Document Reviewed: 09/01/2014 Elsevier Interactive Patient Education 2016 [employer] Back Pain, Adult Back pain is very common in adults.The cause of back pain is rarely dangerous and the pain often gets better over time.The cause of your back pain may not be known. Some common causes of back pain include:  Strain of the muscles or ligaments supporting the spine.  Wear and tear (degeneration) of the spinal  disks.  Arthritis.  Direct injury to the back. For many people, back pain may return. Since back pain is rarely dangerous, most people can learn to manage this condition on their own. HOME CARE INSTRUCTIONS Watch your back pain for any changes. The following actions may help to lessen any discomfort you are feeling:  Remain active. It is stressful on your back to sit or stand in one place for long periods of time. Do not sit, drive, or stand in one place for more than 30 minutes at a time. Take short walks on even surfaces as soon as you are able.Try to increase the length of time you walk each day.  Exercise regularly as directed by your health care provider. Exercise helps your back heal faster. It also helps avoid future injury by keeping your muscles strong and flexible.  Do not stay in bed.Resting  more than 1-2 days can delay your recovery.  Pay attention to your body when you bend and lift. The most comfortable positions are those that put less stress on your recovering back. Always use proper lifting techniques, including:  Bending your knees.  Keeping the load close to your body.  Avoiding twisting.  Find a comfortable position to sleep. Use a firm mattress and lie on your side with your knees slightly bent. If you lie on your back, put a pillow under your knees.  Avoid feeling anxious or stressed.Stress increases muscle tension and can worsen back pain.It is important to recognize when you are anxious or stressed and learn ways to manage it, such as with exercise.  Take medicines only as directed by your health care provider. Over-the-counter medicines to reduce pain and inflammation are often the most helpful.Your health care provider may prescribe muscle relaxant drugs.These medicines help dull your pain so you can more quickly return to your normal activities and healthy exercise.  Apply ice to the injured area:  Put ice in a plastic bag.  Place a towel between your  skin and the bag.  Leave the ice on for 20 minutes, 2-3 times a day for the first 2-3 days. After that, ice and heat may be alternated to reduce pain and spasms.  Maintain a healthy weight. Excess weight puts extra stress on your back and makes it difficult to maintain good posture. SEEK MEDICAL CARE IF:  You have pain that is not relieved with rest or medicine.  You have increasing pain going down into the legs or buttocks.  You have pain that does not improve in one week.  You have night pain.  You lose weight.  You have a fever or chills. SEEK IMMEDIATE MEDICAL CARE IF:   You develop new bowel or bladder control problems.  You have unusual weakness or numbness in your arms or legs.  You develop nausea or vomiting.  You develop abdominal pain.  You feel faint.   This information is not intended to replace advice given to you by your health care provider. Make sure you discuss any questions you have with your health care provider.   Document Released: 07/08/2005 Document Revised: 07/29/2014 Document Reviewed: 11/09/2013 Elsevier Interactive Patient Education [employer].

## 2016-04-26 NOTE — Progress Notes (Signed)
Patient: Katelyn Bartlett Female    DOB: 04/01/1956   60 y.o.   MRN: [id number] Visit Date: 04/26/2016  Today's Provider: Vernie Murders, PA   Chief Complaint  Patient presents with  . Back Pain   Subjective:    Back Pain  This is a recurrent problem. The current episode started more than 1 month ago. The problem occurs constantly. The problem has been gradually worsening since onset. The quality of the pain is described as aching. The pain is the same all the time. The symptoms are aggravated by standing. She has tried NSAIDs and heat for the symptoms. The treatment provided no relief.    Patient Active Problem List   Diagnosis Date Noted  . Supraventricular tachycardia ([place]) 04/21/2015  . Arthropathia 01/16/2015  . Alopecia 01/16/2015  . 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 Benign hypertension 01/16/2015  . Abnormal results of thyroid function studies 01/16/2015  . Abnormal weight gain 01/16/2015  . Raynaud's syndrome without gangrene 01/16/2015  . Fatigue 01/16/2015   Past Surgical History:  Procedure Laterality Date  . ABDOMINAL HYSTERECTOMY  1990  . CHOLECYSTECTOMY  1989  . ROTATOR CUFF REPAIR Right 2007  . SKIN CANCER EXCISION  2011   ABOVE LIP   Family History  Problem Relation Age of Onset  . Adopted: Yes  . Family history unknown: Yes   Allergies  Allergen Reactions  . Morphine Sulfate     BP drops     Previous Medications   AMLODIPINE (NORVASC) 5 MG TABLET    TAKE 1 TABLET BY MOUTH DAILY   ASPIRIN 325 MG TABLET    Take 325 mg by mouth daily.   METOPROLOL TARTRATE (LOPRESSOR) 25 MG TABLET    TAKE 1 TABLET BY MOUTH TWICE DAILY   MULTIPLE VITAMIN PO    Take 1 tablet by mouth daily.   NAPROXEN SODIUM (ALEVE) 220 MG TABLET    Take 1 tablet by mouth as needed.    OMEGA-3 FATTY ACIDS PO    Take 1 capsule by mouth daily.   PRAVASTATIN (PRAVACHOL) 40 MG TABLET    TAKE 1 TABLET BY MOUTH DAILY    Review of Systems  Constitutional:  Negative.   Respiratory: Negative.   Cardiovascular: Negative.   Musculoskeletal: Positive for back pain.    Social History  Substance Use Topics  . Smoking status: Current Every Day Smoker    Packs/day: 0.50    Years: 30.00    Types: Cigarettes  . Smokeless tobacco: Never Used  . Alcohol use No   Objective:   BP 132/86 (BP Location: Right Arm, Patient Position: Sitting, Cuff Size: Normal)   Pulse 68   Temp 98.1 F (36.7 C) (Oral)   Resp 14   Wt 169 lb 6.4 oz (76.8 kg)   BMI 26.53 kg/m   Physical Exam  Constitutional: She is oriented to person, place, and time. She appears well-developed and well-nourished. No distress.  HENT:  Head: Normocephalic and atraumatic.  Right Ear: Hearing normal.  Left Ear: Hearing normal.  Nose: Nose normal.  Eyes: Conjunctivae and lids are normal. Right eye exhibits no discharge. Left eye exhibits no discharge. No scleral icterus.  Neck: Neck supple.  Soreness in right side of neck to rotate. Some stiffness.  Cardiovascular: Normal rate and regular rhythm.   Pulmonary/Chest: Effort normal and breath sounds normal. No respiratory distress.  Abdominal: Bowel sounds are normal.  Musculoskeletal:  Constant ache in right lower paravertebral muscles. No tenderness.  SLR's 90 degrees without pain.  Neurological: She is alert and oriented to person, place, and time. She has normal reflexes.  Skin: Skin is intact. No lesion and no rash noted.  Psychiatric: She has a normal mood and affect. Her speech is normal and behavior is normal. Thought content normal.      Assessment & Plan:     1. Chronic right-sided low back pain without sciatica Recent flare of right low back pain. Initial onset approximately 15 years ago with bending over to pack her chocolate boxes. Occasionally lifting up to 50 lb boxes now. No help from Naproxen. May use moist heat applications, Icy Hot with Lidocaine or lumbar supporter. Will treat with Celebrex and Cyclobenzaprine.  Check L-S spine films to check for bony abnormality. Recheck prn. - celecoxib (CELEBREX) 200 MG capsule; Take 1 capsule (200 mg total) by mouth 2 (two) times daily.  Dispense: 60 capsule; Refill: 1 - cyclobenzaprine (FLEXERIL) 5 MG tablet; Take 1 tablet (5 mg total) by mouth 3 (three) times daily as needed for muscle spasms.  Dispense: 30 tablet; Refill: 1 - DG Lumbar Spine Complete  2. Arthropathia History of stiffness in right shoulder, neck and left base of thumb.  - celecoxib (CELEBREX) 200 MG capsule; Take 1 capsule (200 mg total) by mouth 2 (two) times daily.  Dispense: 60 capsule; Refill: 1

## 2016-04-26 NOTE — Telephone Encounter (Signed)
Patient advised.

## 2016-08-21 ENCOUNTER — Encounter: Payer: Self-pay | Admitting: Family Medicine

## 2016-08-26 ENCOUNTER — Ambulatory Visit (INDEPENDENT_AMBULATORY_CARE_PROVIDER_SITE_OTHER): Payer: BLUE CROSS/BLUE SHIELD | Admitting: Family Medicine

## 2016-08-26 ENCOUNTER — Encounter: Payer: Self-pay | Admitting: Family Medicine

## 2016-08-26 VITALS — BP 124/76 | HR 76 | Temp 98.5°F | Resp 16 | Wt 171.0 lb

## 2016-08-26 DIAGNOSIS — I10 Essential (primary) hypertension: Secondary | ICD-10-CM

## 2016-08-26 DIAGNOSIS — E782 Mixed hyperlipidemia: Secondary | ICD-10-CM

## 2016-08-26 DIAGNOSIS — M542 Cervicalgia: Secondary | ICD-10-CM

## 2016-08-26 NOTE — Patient Instructions (Signed)
Cervical Strain and Sprain Rehab Ask your health care provider which exercises are safe for you. Do exercises exactly as told by your health care provider and adjust them as directed. It is normal to feel mild stretching, pulling, tightness, or discomfort as you do these exercises, but you should stop right away if you feel sudden pain or your pain gets worse.Do not begin these exercises until told by your health care provider. Stretching and range of motion exercises These exercises warm up your muscles and joints and improve the movement and flexibility of your neck. These exercises also help to relieve pain, numbness, and tingling. Exercise A: Cervical side bend 1. Using good posture, sit on a stable chair or stand up. 2. Without moving your shoulders, slowly tilt your left / right ear to your shoulder until you feel a stretch in your neck muscles. You should be looking straight ahead. 3. Hold for ____10______ seconds. 4. Repeat with the other side of your neck. Repeat _____10_____ times. Complete this exercise ____2______ times a day. Exercise B: Cervical rotation 1. Using good posture, sit on a stable chair or stand up. 2. Slowly turn your head to the side as if you are looking over your left / right shoulder.  Keep your eyes level with the ground.  Stop when you feel a stretch along the side and the back of your neck. 3. Hold for _______10___ seconds. 4. Repeat this by turning to your other side. Repeat ____10______ times. Complete this exercise _______2___ times a day. Exercise C: Thoracic extension and pectoral stretch 1. Roll a towel or a small blanket so it is about 4 inches (10 cm) in diameter. 2. Lie down on your back on a firm surface. 3. Put the towel lengthwise, under your spine in the middle of your back. It should not be not under your shoulder blades. The towel should line up with your spine from your middle back to your lower back. 4. Put your hands behind your head and  let your elbows fall out to your sides. 5. Hold for _____10_____ seconds. Repeat ______10____ times. Complete this exercise _______2___ times a day. Strengthening exercises These exercises build strength and endurance in your neck. Endurance is the ability to use your muscles for a long time, even after your muscles get tired. Exercise D: Upper cervical flexion, isometric 1. Lie on your back with a thin pillow behind your head and a small rolled-up towel under your neck. 2. Gently tuck your chin toward your chest and nod your head down to look toward your feet. Do not lift your head off the pillow. 3. Hold for ____10____ seconds. 4. Release the tension slowly. Relax your neck muscles completely before you repeat this exercise. Repeat _____10_____ times. Complete this exercise ____2______ times a day. Exercise E: Cervical extension, isometric 1. Stand about 6 inches (15 cm) away from a wall, with your back facing the wall. 2. Place a soft object, about 6-8 inches (15-20 cm) in diameter, between the back of your head and the wall. A soft object could be a small pillow, a ball, or a folded towel. 3. Gently tilt your head back and press into the soft object. Keep your jaw and forehead relaxed. 4. Hold for _____10_____ seconds. 5. Release the tension slowly. Relax your neck muscles completely before you repeat this exercise. Repeat ______10____ times. Complete this exercise _______2___ times a day. Posture and body mechanics   Body mechanics refers to the movements and positions of your body  while you do your daily activities. Posture is part of body mechanics. Good posture and healthy body mechanics can help to relieve stress in your body's tissues and joints. Good posture means that your spine is in its natural S-curve position (your spine is neutral), your shoulders are pulled back slightly, and your head is not tipped forward. The following are general guidelines for applying improved posture and  body mechanics to your everyday activities. Standing  When standing, keep your spine neutral and keep your feet about hip-width apart. Keep a slight bend in your knees. Your ears, shoulders, and hips should line up.  When you do a task in which you stand in one place for a long time, place one foot up on a stable object that is 2-4 inches (5-10 cm) high, such as a footstool. This helps keep your spine neutral. Sitting  When sitting, keep your spine neutral and your keep feet flat on the floor. Use a footrest, if necessary, and keep your thighs parallel to the floor. Avoid rounding your shoulders, and avoid tilting your head forward.  When working at a desk or a computer, keep your desk at a height where your hands are slightly lower than your elbows. Slide your chair under your desk so you are close enough to maintain good posture.  When working at a computer, place your monitor at a height where you are looking straight ahead and you do not have to tilt your head forward or downward to look at the screen. Resting When lying down and resting, avoid positions that are most painful for you. Try to support your neck in a neutral position. You can use a contour pillow or a small rolled-up towel. Your pillow should support your neck but not push on it. This information is not intended to replace advice given to you by your health care provider. Make sure you discuss any questions you have with your health care provider. Document Released: 07/08/2005 Document Revised: 03/14/2016 Document Reviewed: 06/14/2015 Elsevier Interactive Patient Education  2017 [employer].

## 2016-08-26 NOTE — Progress Notes (Signed)
Patient: Katelyn Bartlett Female    DOB: [birth year]   61 y.o.   MRN: [id number] Visit Date: 08/26/2016  Today's Provider: Vernie Murders, PA   Chief Complaint  Patient presents with  . Hypertension  . Hyperlipidemia   Subjective:    Hypertension  This is a chronic problem. The problem is unchanged. The problem is controlled. Pertinent negatives include no anxiety, blurred vision, chest pain, headaches, malaise/fatigue, neck pain, orthopnea, palpitations, peripheral edema, PND, shortness of breath or sweats. There are no compliance problems.   Hyperlipidemia  This is a chronic problem. The problem is controlled. Pertinent negatives include no chest pain or shortness of breath. Current antihyperlipidemic treatment includes statins. There are no compliance problems.    Lab Results  Component Value Date   CHOL 139 02/13/2016   CHOL 202 (H) 10/16/2015   CHOL 170 04/12/2015   Lab Results  Component Value Date   HDL 32 (L) 02/13/2016   HDL 45 10/16/2015   HDL 36 (L) 04/12/2015   Lab Results  Component Value Date   LDLCALC 82 02/13/2016   LDLCALC 122 (H) 10/16/2015   LDLCALC 101 (H) 04/12/2015   Lab Results  Component Value Date   TRIG 125 02/13/2016   TRIG 174 (H) 10/16/2015   TRIG 165 (H) 04/12/2015   Lab Results  Component Value Date   CHOLHDL 4.3 02/13/2016   CHOLHDL 4.5 (H) 10/16/2015   CHOLHDL 4.7 (H) 04/12/2015   Patient Active Problem List   Diagnosis Date Noted  . Supraventricular tachycardia ([place]) 04/21/2015  . Arthropathia 01/16/2015  . Alopecia 01/16/2015  . 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 Benign hypertension 01/16/2015  . Abnormal results of thyroid function studies 01/16/2015  . Abnormal weight gain 01/16/2015  . Raynaud's syndrome without gangrene 01/16/2015  . Fatigue 01/16/2015   Past Surgical History:  Procedure Laterality Date  . ABDOMINAL HYSTERECTOMY  1990  . CHOLECYSTECTOMY  1989  . ROTATOR CUFF  REPAIR Right 2007  . SKIN CANCER EXCISION  2011   ABOVE LIP   Family History  Problem Relation Age of Onset  . Adopted: Yes  . Family history unknown: Yes    Allergies  Allergen Reactions  . Morphine Sulfate     BP drops     Current Outpatient Prescriptions:  .  amLODipine (NORVASC) 5 MG tablet, TAKE 1 TABLET BY MOUTH DAILY, Disp: 90 tablet, Rfl: 3 .  aspirin 325 MG tablet, Take 325 mg by mouth daily., Disp: , Rfl:  .  metoprolol tartrate (LOPRESSOR) 25 MG tablet, TAKE 1 TABLET BY MOUTH TWICE DAILY, Disp: 180 tablet, Rfl: 3 .  MULTIPLE VITAMIN PO, Take 1 tablet by mouth daily., Disp: , Rfl:  .  OMEGA-3 FATTY ACIDS PO, Take 1 capsule by mouth daily., Disp: , Rfl:  .  pravastatin (PRAVACHOL) 40 MG tablet, TAKE 1 TABLET BY MOUTH DAILY, Disp: 90 tablet, Rfl: 3 .  celecoxib (CELEBREX) 200 MG capsule, TAKE 1 CAPSULE(200 MG) BY MOUTH TWICE DAILY (Patient not taking: Reported on 08/26/2016), Disp: 180 capsule, Rfl: 1 .  cyclobenzaprine (FLEXERIL) 5 MG tablet, Take 1 tablet (5 mg total) by mouth 3 (three) times daily as needed for muscle spasms. (Patient not taking: Reported on 08/26/2016), Disp: 30 tablet, Rfl: 1 .  naproxen sodium (ALEVE) 220 MG tablet, Take 1 tablet by mouth as needed. , Disp: , Rfl:   Review of Systems  Constitutional: Negative.  Negative for malaise/fatigue.  Eyes: Negative  for blurred vision.  Respiratory: Negative.  Negative for cough and shortness of breath.   Cardiovascular: Negative.  Negative for chest pain, palpitations, orthopnea and PND.  Gastrointestinal: Negative.   Musculoskeletal: Negative for neck pain.  Neurological: Negative for dizziness, light-headedness and headaches.    Social History  Substance Use Topics  . Smoking status: Current Every Day Smoker    Packs/day: 0.50    Years: 30.00    Types: Cigarettes  . Smokeless tobacco: Never Used  . Alcohol use No   Objective:   BP 124/76 (BP Location: Right Arm, Patient Position: Sitting, Cuff Size:  Normal)   Pulse 76   Temp 98.5 F (36.9 C) (Oral)   Resp 16   Wt 171 lb (77.6 kg)   BMI 26.78 kg/m   Physical Exam  Constitutional: She is oriented to person, place, and time. She appears well-developed and well-nourished. No distress.  HENT:  Head: Normocephalic and atraumatic.  Right Ear: Hearing normal.  Left Ear: Hearing normal.  Nose: Nose normal.  Eyes: Conjunctivae and lids are normal. Right eye exhibits no discharge. Left eye exhibits no discharge. No scleral icterus.  Neck: Neck supple.  Cardiovascular: Normal rate and regular rhythm.   Pulmonary/Chest: Effort normal and breath sounds normal. No respiratory distress.  Abdominal: Soft. Bowel sounds are normal.  Musculoskeletal:  Posterior neck stiffness and tenderness. Increased pain to flex or turn head. No known injury or radiation.  Neurological: She is alert and oriented to person, place, and time.  Skin: Skin is intact. No lesion and no rash noted.  Psychiatric: She has a normal mood and affect. Her speech is normal and behavior is normal. Thought content normal.      Assessment & Plan:     1. Benign hypertension Well controlled without chest pain, palpitations, edema or dyspnea. Tolerating Metoprolol 25 mg BID and Norvasc 5 mg qd without side effects. Recheck routine labs and follow up in 6 months. - CBC with Differential/Platelet - Comprehensive metabolic panel  2. Combined fat and carbohydrate induced hyperlipemia Continues to tolerate the Pravastatin 40 mg qd with Omega-3 Fish Oil qd. Encouraged to follow low fat diet. Recheck labs and follow up in 6 months. - Comprehensive metabolic panel - Lipid panel  3. 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 No relief from Flexeril and Celebrex. May apply moist heat and [place] with Lidocaine. Recheck prn.       Vernie Murders, PA  [facility]

## 2016-08-28 LAB — CBC WITH DIFFERENTIAL/PLATELET
BASOS ABS: 0 10*3/uL (ref 0.0–0.2)
Basos: 0 %
EOS (ABSOLUTE): 0.1 10*3/uL (ref 0.0–0.4)
EOS: 2 %
Hematocrit: 46.7 % — ABNORMAL HIGH (ref 34.0–46.6)
Hemoglobin: 15.9 g/dL (ref 11.1–15.9)
Immature Grans (Abs): 0 10*3/uL (ref 0.0–0.1)
Immature Granulocytes: 0 %
LYMPHS ABS: 2.5 10*3/uL (ref 0.7–3.1)
Lymphs: 35 %
MCH: 31.5 pg (ref 26.6–33.0)
MCHC: 34 g/dL (ref 31.5–35.7)
MCV: 93 fL (ref 79–97)
MONOS ABS: 0.6 10*3/uL (ref 0.1–0.9)
Monocytes: 9 %
Neutrophils Absolute: 3.9 10*3/uL (ref 1.4–7.0)
Neutrophils: 54 %
Platelets: 290 10*3/uL (ref 150–379)
RBC: 5.04 x10E6/uL (ref 3.77–5.28)
RDW: 13.6 % (ref 12.3–15.4)
WBC: 7.2 10*3/uL (ref 3.4–10.8)

## 2016-08-28 LAB — COMPREHENSIVE METABOLIC PANEL
ALK PHOS: 72 IU/L (ref 39–117)
ALT: 27 IU/L (ref 0–32)
AST: 20 IU/L (ref 0–40)
Albumin/Globulin Ratio: 1.9 (ref 1.2–2.2)
Albumin: 4.6 g/dL (ref 3.6–4.8)
BILIRUBIN TOTAL: 0.4 mg/dL (ref 0.0–1.2)
BUN/Creatinine Ratio: 23 (ref 12–28)
BUN: 13 mg/dL (ref 8–27)
CHLORIDE: 102 mmol/L (ref 96–106)
CO2: 24 mmol/L (ref 18–29)
CREATININE: 0.56 mg/dL — AB (ref 0.57–1.00)
Calcium: 9.1 mg/dL (ref 8.7–10.3)
GFR calc Af Amer: 116 mL/min/{1.73_m2} (ref >59)
GFR calc non Af Amer: 101 mL/min/{1.73_m2} (ref >59)
GLOBULIN, TOTAL: 2.4 g/dL (ref 1.5–4.5)
Glucose: 100 mg/dL — ABNORMAL HIGH (ref 65–99)
POTASSIUM: 4.2 mmol/L (ref 3.5–5.2)
SODIUM: 142 mmol/L (ref 134–144)
Total Protein: 7 g/dL (ref 6.0–8.5)

## 2016-08-28 LAB — LIPID PANEL
CHOLESTEROL TOTAL: 173 mg/dL (ref 100–199)
Chol/HDL Ratio: 4.2 ratio units (ref 0.0–4.4)
HDL: 41 mg/dL (ref >39)
LDL CALC: 102 mg/dL — AB (ref 0–99)
Triglycerides: 150 mg/dL — ABNORMAL HIGH (ref 0–149)
VLDL Cholesterol Cal: 30 mg/dL (ref 5–40)

## 2016-08-30 ENCOUNTER — Telehealth: Payer: Self-pay

## 2016-08-30 NOTE — Telephone Encounter (Signed)
-----   Message from Margo Common, [place] sent at 08/29/2016  4:52 PM EST ----- All blood tests essentially normal except triglycerides and LDL cholesterol borderline. Continue low fat diet, Omega-3 Fish Oil and Pravastatin 40 mg qd. Recheck levels in 6 months.

## 2016-08-30 NOTE — Telephone Encounter (Signed)
Advised pt of lab results. Pt verbally acknowledges understanding. Emily Drozdowski, CMA   

## 2016-09-10 ENCOUNTER — Ambulatory Visit (INDEPENDENT_AMBULATORY_CARE_PROVIDER_SITE_OTHER): Payer: Self-pay | Admitting: Family Medicine

## 2016-09-10 ENCOUNTER — Encounter: Payer: Self-pay | Admitting: Family Medicine

## 2016-09-10 VITALS — BP 110/80 | HR 86 | Temp 98.3°F | Resp 18 | Wt 166.4 lb

## 2016-09-10 DIAGNOSIS — J0100 Acute maxillary sinusitis, unspecified: Secondary | ICD-10-CM

## 2016-09-10 DIAGNOSIS — R058 Other specified cough: Secondary | ICD-10-CM

## 2016-09-10 DIAGNOSIS — R05 Cough: Secondary | ICD-10-CM

## 2016-09-10 MED ORDER — AMOXICILLIN 875 MG PO TABS: 875.0000 mg | ORAL_TABLET | Freq: Two times a day (BID) | ORAL | 0 refills | Status: DC

## 2016-09-10 NOTE — Progress Notes (Signed)
Patient: Katelyn Bartlett Female    DOB: January 10, 1956   61 y.o.   MRN: [id number] Visit Date: 09/10/2016  Today's Provider: Vernie Murders, PA   Chief Complaint  Patient presents with  . Cough  . Nasal Congestion  . Sore Throat   Subjective:    URI   This is a new problem. Episode onset: last Sunday. The problem has been waxing and waning. There has been no fever. Associated symptoms include congestion, coughing and a sore throat. She has tried antihistamine for the symptoms. The treatment provided mild relief.   Patient Active Problem List   Diagnosis Date Noted  . Supraventricular tachycardia ([place]) 04/21/2015  . Arthropathia 01/16/2015  . Alopecia 01/16/2015  . 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 Benign hypertension 01/16/2015  . Abnormal results of thyroid function studies 01/16/2015  . Abnormal weight gain 01/16/2015  . Raynaud's syndrome without gangrene 01/16/2015  . Fatigue 01/16/2015   Past Surgical History:  Procedure Laterality Date  . ABDOMINAL HYSTERECTOMY  1990  . CHOLECYSTECTOMY  1989  . ROTATOR CUFF REPAIR Right 2007  . SKIN CANCER EXCISION  2011   ABOVE LIP   Family History  Problem Relation Age of Onset  . Adopted: Yes  . Family history unknown: Yes   Allergies  Allergen Reactions  . Morphine Sulfate     BP drops     Previous Medications   AMLODIPINE (NORVASC) 5 MG TABLET    TAKE 1 TABLET BY MOUTH DAILY   ASPIRIN 325 MG TABLET    Take 325 mg by mouth daily.   CYCLOBENZAPRINE (FLEXERIL) 5 MG TABLET    Take 1 tablet (5 mg total) by mouth 3 (three) times daily as needed for muscle spasms.   METOPROLOL TARTRATE (LOPRESSOR) 25 MG TABLET    TAKE 1 TABLET BY MOUTH TWICE DAILY   MULTIPLE VITAMIN PO    Take 1 tablet by mouth daily.   NAPROXEN SODIUM (ALEVE) 220 MG TABLET    Take 1 tablet by mouth as needed.    OMEGA-3 FATTY ACIDS PO    Take 1 capsule by mouth daily.   PRAVASTATIN (PRAVACHOL) 40 MG TABLET    TAKE 1 TABLET BY  MOUTH DAILY    Review of Systems  Constitutional: Negative.   HENT: Positive for congestion and sore throat.   Respiratory: Positive for cough.   Cardiovascular: Negative.     Social History  Substance Use Topics  . Smoking status: Current Every Day Smoker    Packs/day: 0.50    Years: 30.00    Types: Cigarettes  . Smokeless tobacco: Never Used  . Alcohol use No   Objective:   BP 110/80 (BP Location: Right Arm, Patient Position: Sitting, Cuff Size: Normal)   Pulse 86   Temp 98.3 F (36.8 C) (Oral)   Resp 18   Wt 166 lb 6.4 oz (75.5 kg)   SpO2 98%   BMI 26.06 kg/m   Physical Exam  Constitutional: She appears well-developed and well-nourished.  HENT:  Head: Normocephalic.  Right Ear: External ear normal.  Left Ear: External ear normal.  Nose: Nose normal.  Poor transillumination of maxillary sinuses with minimal discomfort. Reddened posterior pharynx with yellow PND. No tonsillar exudates.  Eyes: Conjunctivae are normal.  Neck: Neck supple.  Cardiovascular: Normal rate and regular rhythm.   Pulmonary/Chest: Effort normal and breath sounds normal.  Abdominal: Soft. Bowel sounds are normal.  Lymphadenopathy:    She has no cervical adenopathy.  Assessment & Plan:     1. Acute maxillary sinusitis, recurrence not specified Onset over the past week with worsening the past 3-4 days. No fever but having PND and sore throat. May use Mucinex-DM and Tylenol or Advil prn discomfort. Increase fluid intake and add antibiotic. Recheck if no better in 5-6 days. - amoxicillin (AMOXIL) 875 MG tablet; Take 1 tablet (875 mg total) by mouth 2 (two) times daily.  Dispense: 20 tablet; Refill: 0  2. Nonproductive cough Onset over the past week secondary to PND. No fever or sputum production. May use Mucinex-DM and cough lozenges as needed.

## 2016-10-12 ENCOUNTER — Other Ambulatory Visit: Payer: Self-pay | Admitting: Family Medicine

## 2016-11-05 ENCOUNTER — Encounter: Payer: Self-pay | Admitting: Family Medicine

## 2016-11-05 ENCOUNTER — Ambulatory Visit (INDEPENDENT_AMBULATORY_CARE_PROVIDER_SITE_OTHER): Payer: Self-pay | Admitting: Family Medicine

## 2016-11-05 VITALS — BP 94/62 | HR 82 | Temp 97.6°F | Wt 169.6 lb

## 2016-11-05 DIAGNOSIS — J028 Acute pharyngitis due to other specified organisms: Secondary | ICD-10-CM

## 2016-11-05 DIAGNOSIS — R509 Fever, unspecified: Secondary | ICD-10-CM

## 2016-11-05 LAB — POCT RAPID STREP A (OFFICE): Rapid Strep A Screen: NEGATIVE

## 2016-11-05 MED ORDER — FIRST-DUKES MOUTHWASH MT SUSP: OROMUCOSAL | 0 refills | Status: DC

## 2016-11-05 NOTE — Progress Notes (Signed)
Patient: Katelyn Bartlett Female    DOB: 07-25-1955   61 y.o.   MRN: [id number] Visit Date: 11/05/2016  Today's Provider: Vernie Murders, PA   Chief Complaint  Patient presents with  . URI   Subjective:    URI   This is a new problem. The current episode started yesterday. The problem has been gradually improving (Patient reports feeling better today.). The maximum temperature recorded prior to her arrival was 100.4 - 100.9 F. The fever has been present for less than 1 day. Associated symptoms include congestion and a sore throat. Associated symptoms comments: Body aches, chill, fever yesterday. Treatments tried: Excedrin and Aleve. The treatment provided moderate relief.   Patient Active Problem List   Diagnosis Date Noted  . Supraventricular tachycardia ([place]) 04/21/2015  . Arthropathia 01/16/2015  . Alopecia 01/16/2015  . 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 Benign hypertension 01/16/2015  . Abnormal results of thyroid function studies 01/16/2015  . Abnormal weight gain 01/16/2015  . Raynaud's syndrome without gangrene 01/16/2015  . Fatigue 01/16/2015   Past Surgical History:  Procedure Laterality Date  . ABDOMINAL HYSTERECTOMY  1990  . CHOLECYSTECTOMY  1989  . ROTATOR CUFF REPAIR Right 2007  . SKIN CANCER EXCISION  2011   ABOVE LIP   Family History  Problem Relation Age of Onset  . Adopted: Yes  . Family history unknown: Yes   Allergies  Allergen Reactions  . Morphine Sulfate     BP drops     Previous Medications   AMLODIPINE (NORVASC) 5 MG TABLET    TAKE 1 TABLET BY MOUTH DAILY   ASPIRIN 325 MG TABLET    Take 325 mg by mouth daily.   METOPROLOL TARTRATE (LOPRESSOR) 25 MG TABLET    TAKE 1 TABLET BY MOUTH TWICE DAILY   MULTIPLE VITAMIN PO    Take 1 tablet by mouth daily.   NAPROXEN SODIUM (ALEVE) 220 MG TABLET    Take 1 tablet by mouth as needed.    OMEGA-3 FATTY ACIDS PO    Take 1 capsule by mouth daily.   PRAVASTATIN (PRAVACHOL) 40  MG TABLET    TAKE 1 TABLET BY MOUTH DAILY    Review of Systems  Constitutional: Positive for chills and fever.  HENT: Positive for congestion and sore throat.   Respiratory: Negative.   Cardiovascular: Negative.   Musculoskeletal:       Body aches     Social History  Substance Use Topics  . Smoking status: Current Every Day Smoker    Packs/day: 0.50    Years: 30.00    Types: Cigarettes  . Smokeless tobacco: Never Used  . Alcohol use No   Objective:   BP 94/62 (BP Location: Right Arm, Patient Position: Sitting, Cuff Size: Normal)   Pulse 82   Temp 97.6 F (36.4 C) (Oral)   Wt 169 lb 9.6 oz (76.9 kg)   SpO2 99%   BMI 26.56 kg/m   Physical Exam  Constitutional: She is oriented to person, place, and time.  HENT:  Head: Normocephalic.  Right Ear: External ear normal.  Left Ear: External ear normal.  Nose: Nose normal.  Very red posterior pharynx and tonsillar pillars. Thick yellow PND. No exudates noted.  Eyes: Conjunctivae are normal.  Neck: Normal range of motion. Neck supple.  Tender submandibular nodes.  Cardiovascular: Normal rate and regular rhythm.   Pulmonary/Chest: Effort normal and breath sounds normal.  Abdominal: Soft. Bowel sounds are normal.  Neurological: She  is alert and oriented to person, place, and time.  Skin: No rash noted.      Assessment & Plan:     1. Acute pharyngitis due to other specified organisms Onset yesterday with chills, fever, body aches and sore throat. Aleve helped with fever and body aches. No cough but feel some PND. Strep test negative. Will treat with Duke's Mouthwash for viral pharyngitis. Recheck prn. - POCT rapid strep A - Diphenhyd-Hydrocort-Nystatin (FIRST-DUKES MOUTHWASH) SUSP; One teaspoon gargled then swallowed four times a day.  Dispense: 237 mL; Refill: 0  2. Fever and chills Elevated temperature the past 24-48 hours. Strep test negative. Suspect viral illness. May continue Aleve and increase fluid intake. - POCT  rapid strep A

## 2016-11-05 NOTE — Patient Instructions (Signed)
Pharyngitis Pharyngitis is redness, pain, and swelling (inflammation) of your pharynx. What are the causes? Pharyngitis is usually caused by infection. Most of the time, these infections are from viruses (viral) and are part of a cold. However, sometimes pharyngitis is caused by bacteria (bacterial). Pharyngitis can also be caused by allergies. Viral pharyngitis may be spread from person to person by coughing, sneezing, and personal items or utensils (cups, forks, spoons, toothbrushes). Bacterial pharyngitis may be spread from person to person by more intimate contact, such as kissing. What are the signs or symptoms? Symptoms of pharyngitis include:  Sore throat.  Tiredness (fatigue).  Low-grade fever.  Headache.  Joint pain and muscle aches.  Skin rashes.  Swollen lymph nodes.  Plaque-like film on throat or tonsils (often seen with bacterial pharyngitis). How is this diagnosed? Your health care provider will ask you questions about your illness and your symptoms. Your medical history, along with a physical exam, is often all that is needed to diagnose pharyngitis. Sometimes, a rapid strep test is done. Other lab tests may also be done, depending on the suspected cause. How is this treated? Viral pharyngitis will usually get better in 3-4 days without the use of medicine. Bacterial pharyngitis is treated with medicines that kill germs (antibiotics). Follow these instructions at home:  Drink enough water and fluids to keep your urine clear or pale yellow.  Only take over-the-counter or prescription medicines as directed by your health care provider:  If you are prescribed antibiotics, make sure you finish them even if you start to feel better.  Do not take aspirin.  Get lots of rest.  Gargle with 8 oz of salt water ( tsp of salt per 1 qt of water) as often as every 1-2 hours to soothe your throat.  Throat lozenges (if you are not at risk for choking) or sprays may be used to  soothe your throat. Contact a health care provider if:  You have large, tender lumps in your neck.  You have a rash.  You cough up green, yellow-brown, or bloody spit. Get help right away if:  Your neck becomes stiff.  You drool or are unable to swallow liquids.  You vomit or are unable to keep medicines or liquids down.  You have severe pain that does not go away with the use of recommended medicines.  You have trouble breathing (not caused by a stuffy nose). This information is not intended to replace advice given to you by your health care provider. Make sure you discuss any questions you have with your health care provider. Document Released: 07/08/2005 Document Revised: 12/14/2015 Document Reviewed: 03/15/2013 Elsevier Interactive Patient Education  2017 [employer].

## 2016-12-12 ENCOUNTER — Emergency Department: Payer: BLUE CROSS/BLUE SHIELD

## 2016-12-12 ENCOUNTER — Encounter: Payer: Self-pay | Admitting: Anesthesiology

## 2016-12-12 ENCOUNTER — Encounter
Admission: EM | Disposition: A | Discharge status: Home / Self Care | Payer: Self-pay | Source: Home / Self Care | Attending: Emergency Medicine

## 2016-12-12 ENCOUNTER — Emergency Department
Admission: EM | Admit: 2016-12-12 | Discharge: 2016-12-12 | Disposition: A | Discharge status: Home / Self Care | Payer: BLUE CROSS/BLUE SHIELD | Attending: Emergency Medicine | Admitting: Emergency Medicine

## 2016-12-12 ENCOUNTER — Encounter: Payer: Self-pay | Admitting: Emergency Medicine

## 2016-12-12 ENCOUNTER — Telehealth: Payer: Self-pay

## 2016-12-12 DIAGNOSIS — R200 Anesthesia of skin: Secondary | ICD-10-CM | POA: Diagnosis not present

## 2016-12-12 DIAGNOSIS — Z85828 Personal history of other malignant neoplasm of skin: Secondary | ICD-10-CM | POA: Insufficient documentation

## 2016-12-12 DIAGNOSIS — R1032 Left lower quadrant pain: Secondary | ICD-10-CM | POA: Diagnosis present

## 2016-12-12 DIAGNOSIS — Z79899 Other long term (current) drug therapy: Secondary | ICD-10-CM | POA: Diagnosis not present

## 2016-12-12 DIAGNOSIS — N200 Calculus of kidney: Secondary | ICD-10-CM | POA: Diagnosis not present

## 2016-12-12 DIAGNOSIS — R202 Paresthesia of skin: Secondary | ICD-10-CM | POA: Insufficient documentation

## 2016-12-12 DIAGNOSIS — Z7982 Long term (current) use of aspirin: Secondary | ICD-10-CM | POA: Insufficient documentation

## 2016-12-12 DIAGNOSIS — F17210 Nicotine dependence, cigarettes, uncomplicated: Secondary | ICD-10-CM | POA: Insufficient documentation

## 2016-12-12 DIAGNOSIS — I10 Essential (primary) hypertension: Secondary | ICD-10-CM | POA: Diagnosis not present

## 2016-12-12 DIAGNOSIS — R109 Unspecified abdominal pain: Secondary | ICD-10-CM

## 2016-12-12 HISTORY — DX: Tachycardia, unspecified: R00.0

## 2016-12-12 HISTORY — DX: Pure hypercholesterolemia, unspecified: E78.00

## 2016-12-12 LAB — GLUCOSE, CAPILLARY: GLUCOSE-CAPILLARY: 126 mg/dL — AB (ref 65–99)

## 2016-12-12 LAB — DIFFERENTIAL
BASOS PCT: 1 %
Basophils Absolute: 0.1 10*3/uL (ref 0–0.1)
EOS PCT: 1 %
Eosinophils Absolute: 0.1 10*3/uL (ref 0–0.7)
Lymphocytes Relative: 23 %
Lymphs Abs: 2.5 10*3/uL (ref 1.0–3.6)
MONO ABS: 0.7 10*3/uL (ref 0.2–0.9)
Monocytes Relative: 7 %
NEUTROS ABS: 7.3 10*3/uL — AB (ref 1.4–6.5)
Neutrophils Relative %: 68 %

## 2016-12-12 LAB — APTT: aPTT: 34 seconds (ref 24–36)

## 2016-12-12 LAB — COMPREHENSIVE METABOLIC PANEL
ALBUMIN: 4.6 g/dL (ref 3.5–5.0)
ALT: 27 U/L (ref 14–54)
AST: 30 U/L (ref 15–41)
Alkaline Phosphatase: 63 U/L (ref 38–126)
Anion gap: 8 (ref 5–15)
BUN: 17 mg/dL (ref 6–20)
CHLORIDE: 106 mmol/L (ref 101–111)
CO2: 25 mmol/L (ref 22–32)
Calcium: 9.5 mg/dL (ref 8.9–10.3)
Creatinine, Ser: 0.72 mg/dL (ref 0.44–1.00)
GFR calc Af Amer: >60 mL/min (ref >60)
GFR calc non Af Amer: >60 mL/min (ref >60)
Glucose, Bld: 122 mg/dL — ABNORMAL HIGH (ref 65–99)
Potassium: 3.8 mmol/L (ref 3.5–5.1)
SODIUM: 139 mmol/L (ref 135–145)
Total Bilirubin: 0.8 mg/dL (ref 0.3–1.2)
Total Protein: 7.9 g/dL (ref 6.5–8.1)

## 2016-12-12 LAB — URINALYSIS, ROUTINE W REFLEX MICROSCOPIC
BACTERIA UA: NONE SEEN
BILIRUBIN URINE: NEGATIVE
Glucose, UA: NEGATIVE mg/dL
Ketones, ur: NEGATIVE mg/dL
LEUKOCYTES UA: NEGATIVE
NITRITE: NEGATIVE
PROTEIN: NEGATIVE mg/dL
SPECIFIC GRAVITY, URINE: 1.006 (ref 1.005–1.030)
pH: 7 (ref 5.0–8.0)

## 2016-12-12 LAB — CBC
HCT: 45.9 % (ref 35.0–47.0)
Hemoglobin: 16.1 g/dL — ABNORMAL HIGH (ref 12.0–16.0)
MCH: 32 pg (ref 26.0–34.0)
MCHC: 35.1 g/dL (ref 32.0–36.0)
MCV: 91.2 fL (ref 80.0–100.0)
PLATELETS: 234 10*3/uL (ref 150–440)
RBC: 5.04 MIL/uL (ref 3.80–5.20)
RDW: 13.5 % (ref 11.5–14.5)
WBC: 10.6 10*3/uL (ref 3.6–11.0)

## 2016-12-12 LAB — TROPONIN I: Troponin I: <0.03 ng/mL (ref <0.03)

## 2016-12-12 LAB — PROTIME-INR
INR: 1.12
Prothrombin Time: 14.5 seconds (ref 11.4–15.2)

## 2016-12-12 IMAGING — CT CT HEAD CODE STROKE
3 series · 15 of 47 positions shown, 18 images · non-contrast
Comparison: None.

CLINICAL DATA: Code stroke. Numbness/ tingling in the left hand and
lips.

EXAM:
CT HEAD WITHOUT CONTRAST
TECHNIQUE: Contiguous axial images were obtained from the base of the skull
through the vertex without intravenous contrast.

[Series 2: head wo · axial · 0.43mm/px · z∈[-43,+82]mm · 9 of 30 slices shown, 12 images]
[im 3/30  brain]
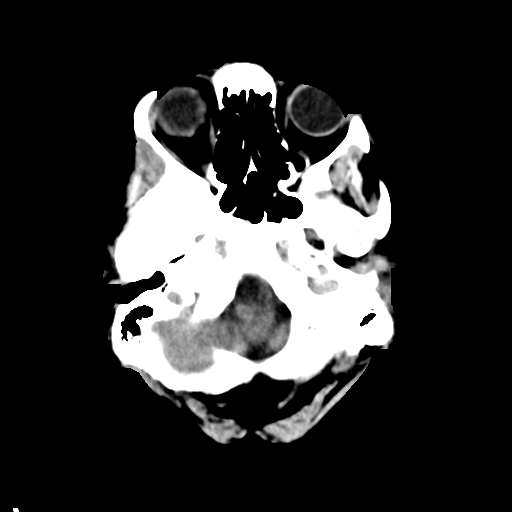
[im 3/30  bone]
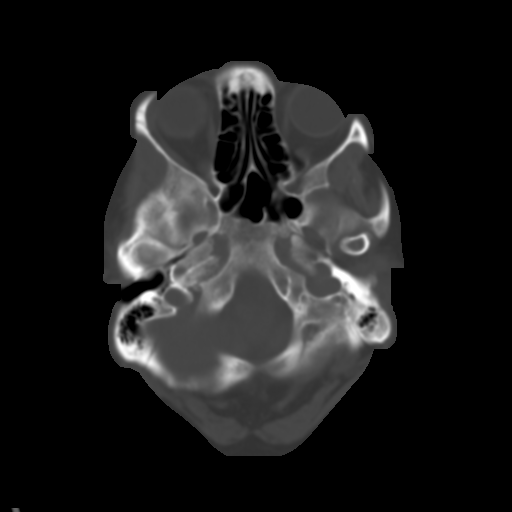
[im 6/30  brain]
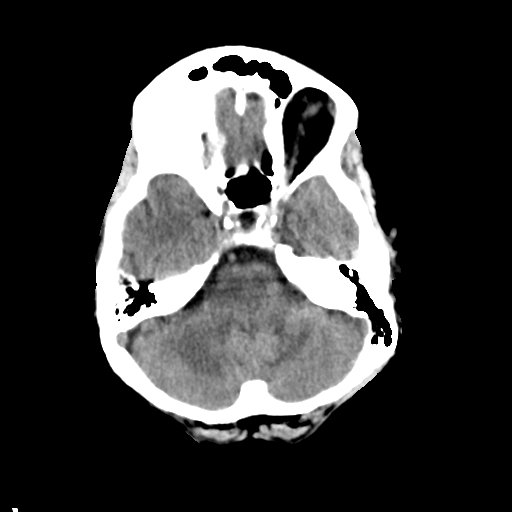
[im 9/30  brain]
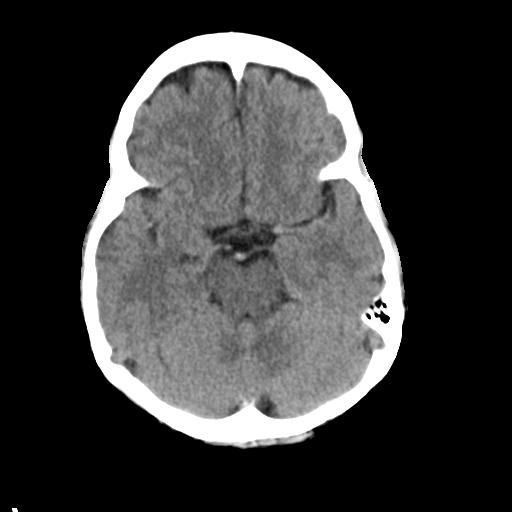
[im 12/30  brain]
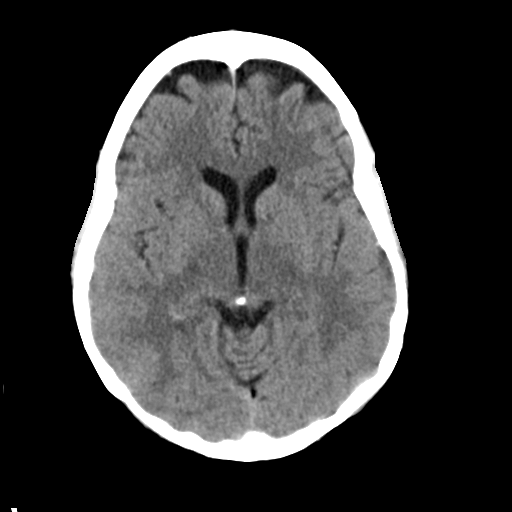
[im 16/30  brain]
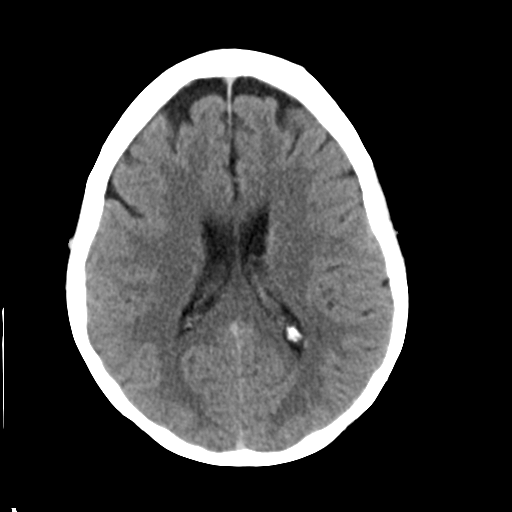
[im 16/30  bone]
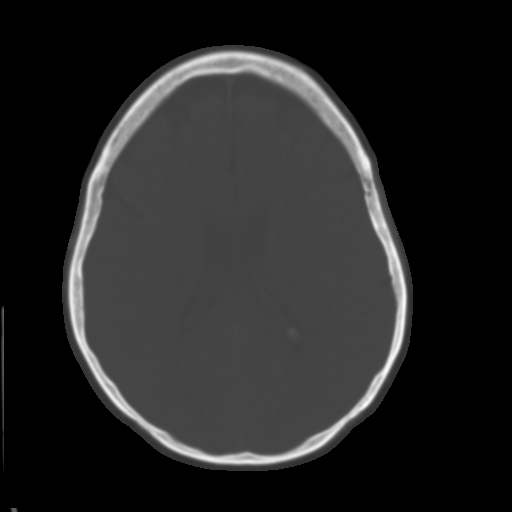
[im 19/30  brain]
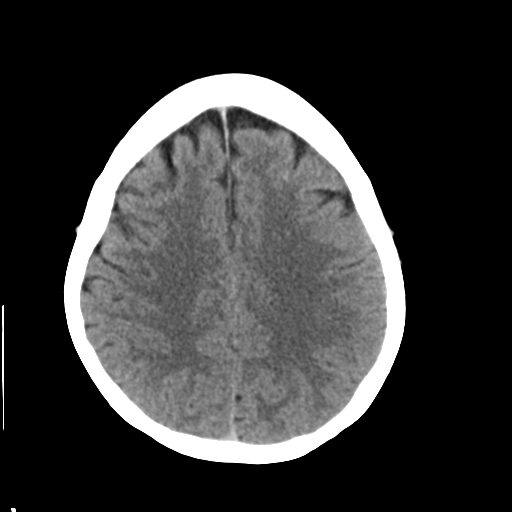
[im 22/30  brain]
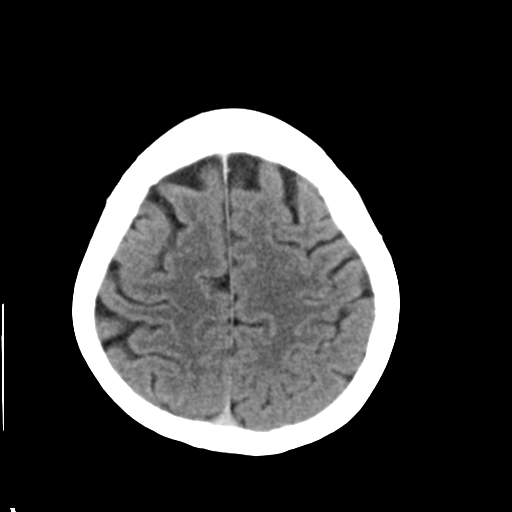
[im 25/30  brain]
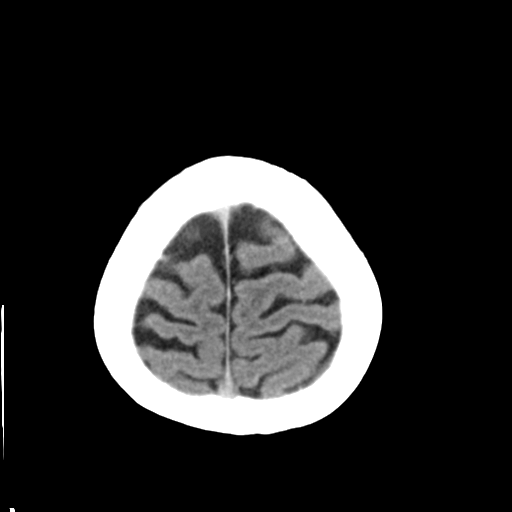
[im 28/30  brain]
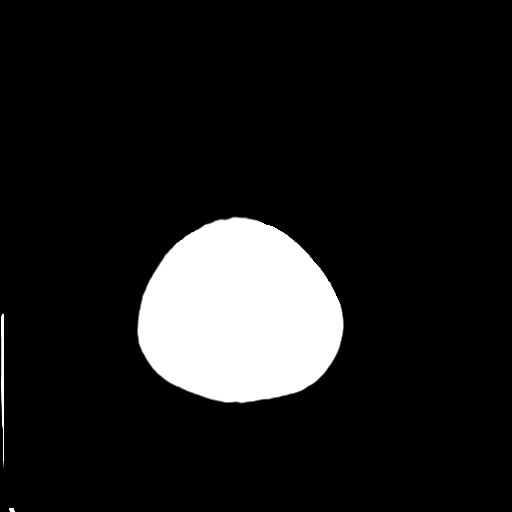
[im 28/30  bone]
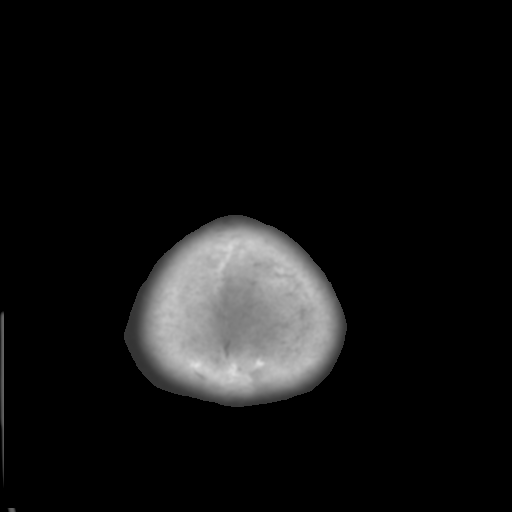

[Series 4: coronal soft tissue · coronal · 0.36mm/px · 3 of 65 slices shown]
[im 22/65  brain]
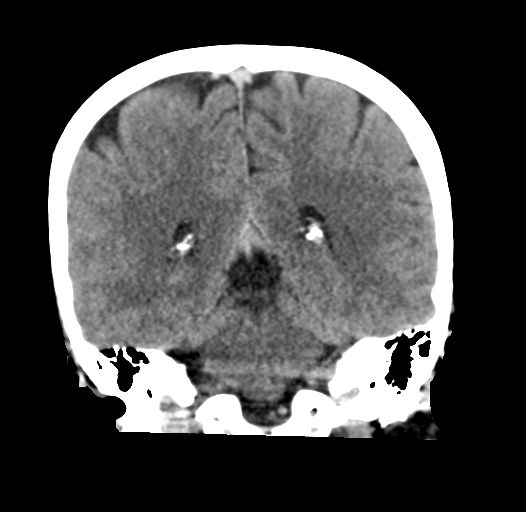
[im 29/65  brain]
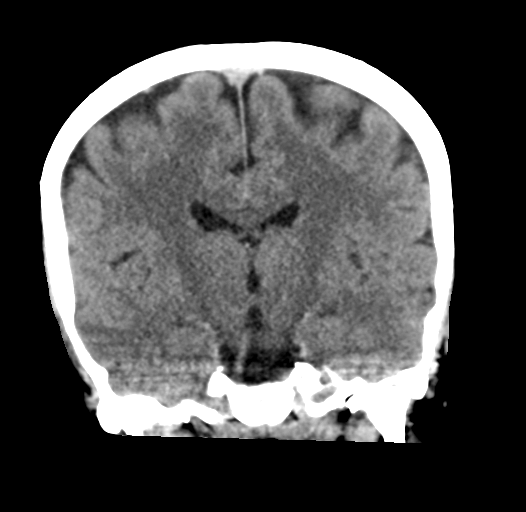
[im 36/65  brain]
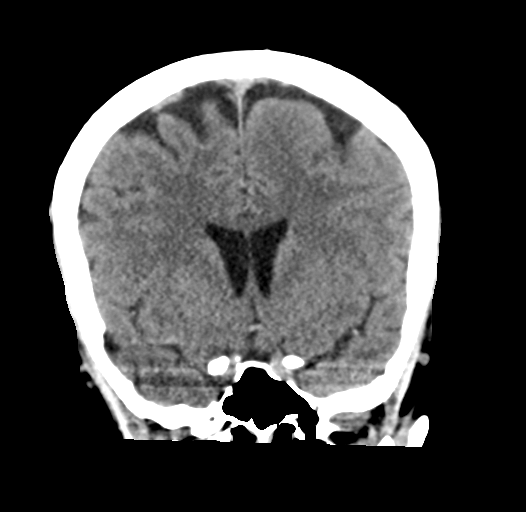

[Series 5: sagittal soft tissue · sagittal · 0.31mm/px · 3 of 51 slices shown]
[im 17/51  brain]
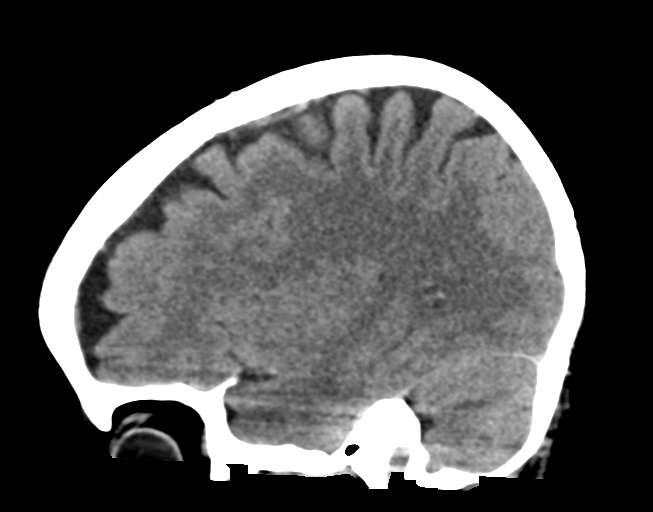
[im 26/51  brain]
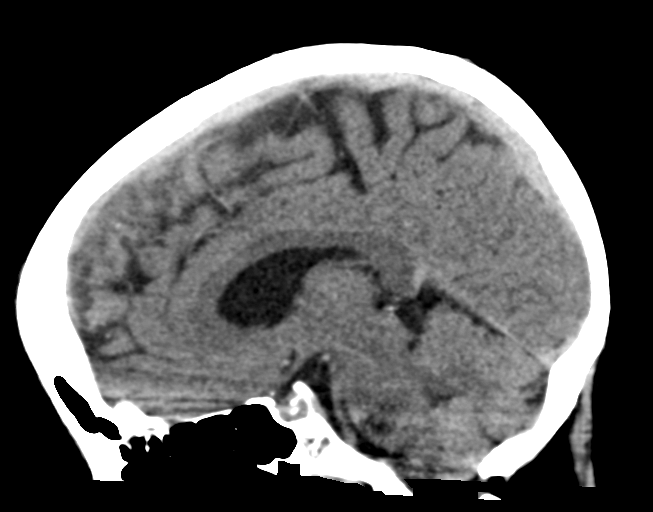
[im 34/51  brain]
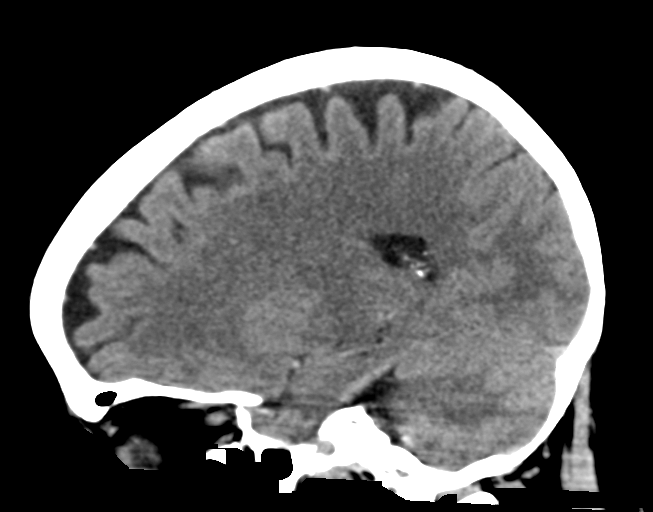

[15 of 47 positions shown; findings below may reference images not displayed]

FINDINGS: Brain: There is no evidence of acute infarct, intracranial
hemorrhage, mass, midline shift, or extra-axial fluid collection.
The ventricles and sulci are normal for age.

Vascular: Bilateral carotid siphon atherosclerosis. No hyperdense
vessel.

Skull: No fracture focal osseous lesion.

Sinuses/Orbits: Visualized paranasal sinuses and mastoid air cells
are clear. Visualized orbits are unremarkable.

Other: None.

ASPECTS (Alberta Stroke Program Early CT Score)

- Ganglionic level infarction (caudate, lentiform nuclei, internal
capsule, insula, M1-M3 cortex): 7

- Supraganglionic infarction (M4-M6 cortex): 3

Total score (0-10 with 10 being normal): 10
IMPRESSION: 1. No evidence of acute intracranial abnormality.
2. ASPECTS is 10.

These results were called by telephone at the time of interpretation
on [DATE] at [DATE] to Dr. RYO , who verbally
acknowledged these results.

## 2016-12-12 IMAGING — CT CT RENAL STONE PROTOCOL
2 of 4 series · 16 of 46 positions shown, 18 images · non-contrast
Comparison: None.

ADDENDUM:
Voice recognition error:  Findings section:

Stomach/Bowel: Stomach, small bowel, appendix, and cecum are normal.
Several diverticular of the descending colon. NO Acute inflammation.
CLINICAL DATA: LEFT flank pain which started in the morning.
Nausea.
EXAM:
CT ABDOMEN AND PELVIS WITHOUT CONTRAST
TECHNIQUE: Multidetector CT imaging of the abdomen and pelvis was performed
following the standard protocol without IV contrast.

[Series 2: stone full standard · axial · 0.65mm/px · z∈[-1059,-654]mm · 13 of 89 slices shown, 15 images]
[im 4/89  soft-tissue]
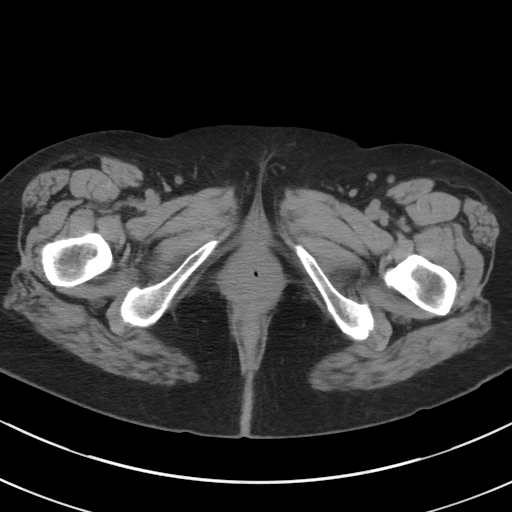
[im 4/89  bone]
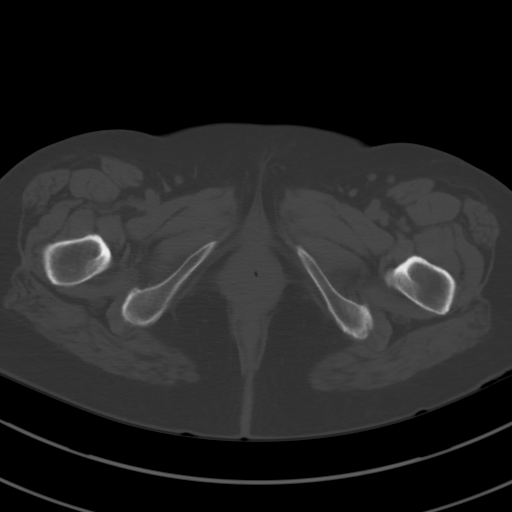
[im 11/89  soft-tissue]
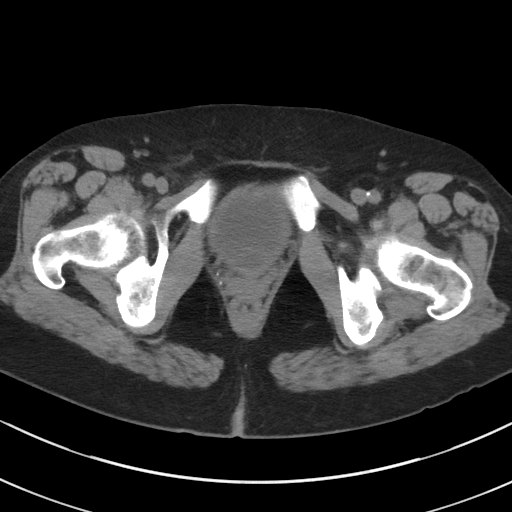
[im 17/89  soft-tissue]
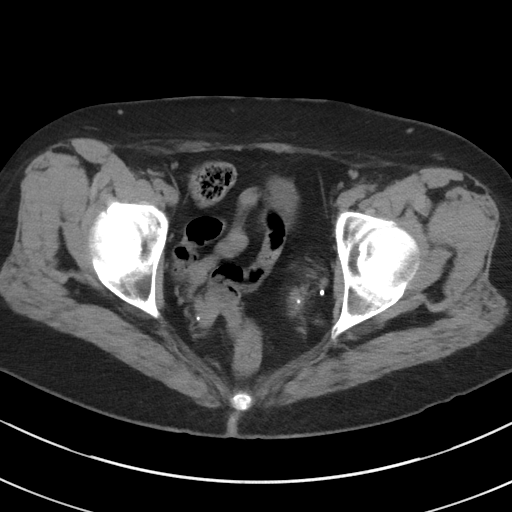
[im 24/89  soft-tissue]
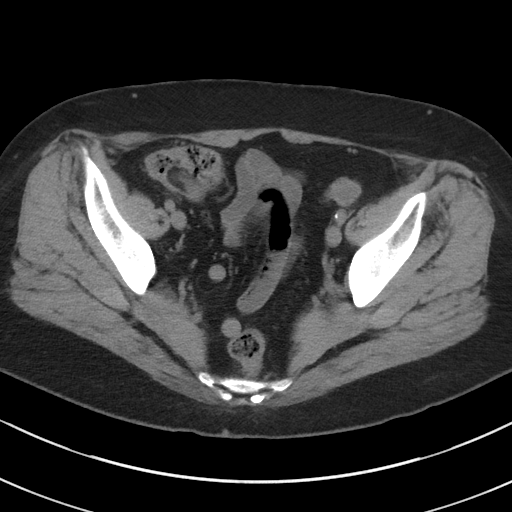
[im 31/89  soft-tissue]
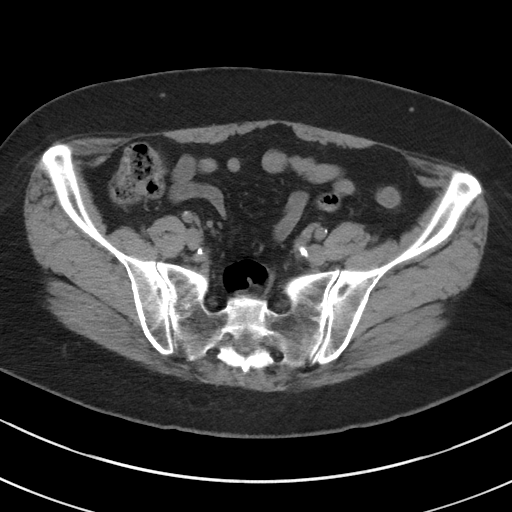
[im 38/89  soft-tissue]
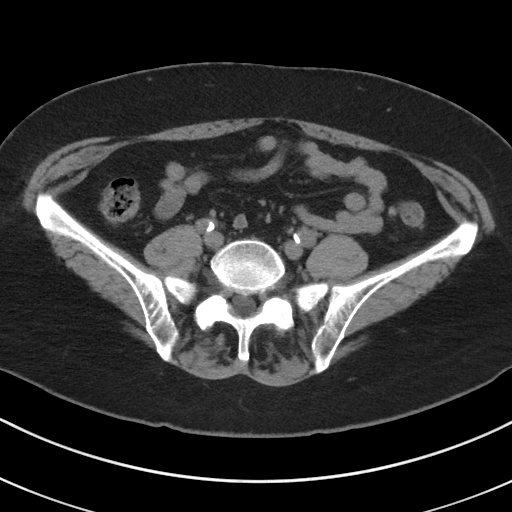
[im 45/89  soft-tissue]
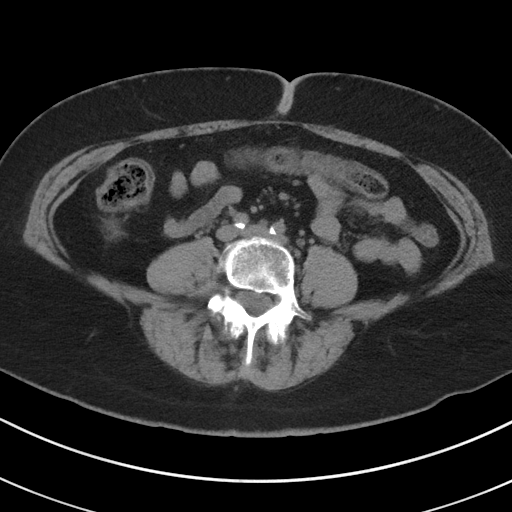
[im 51/89  soft-tissue]
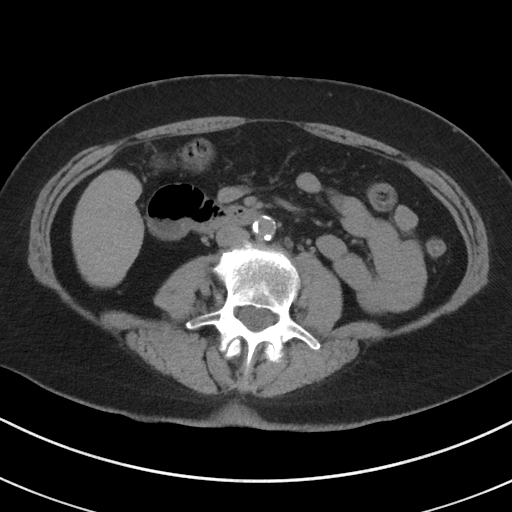
[im 58/89  soft-tissue]
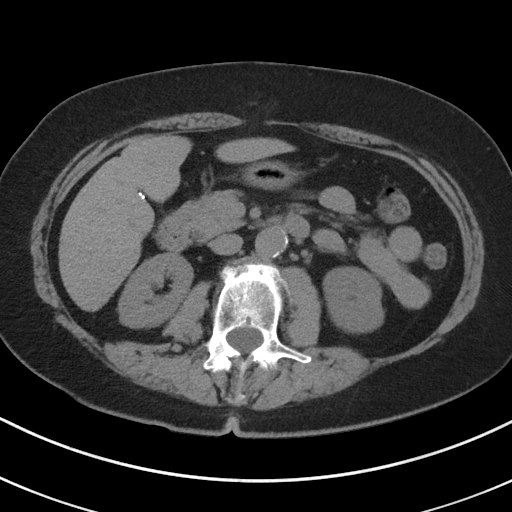
[im 58/89  bone]
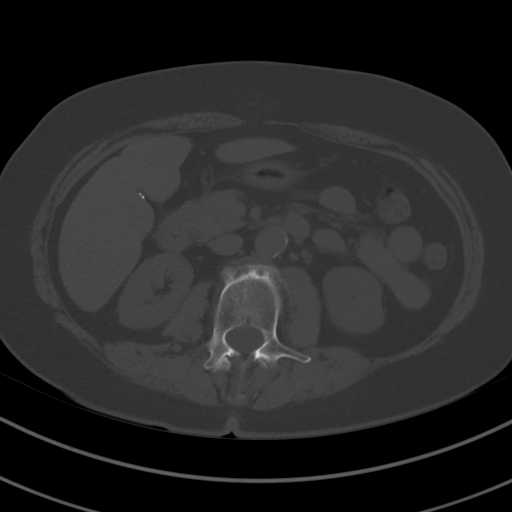
[im 65/89  soft-tissue]
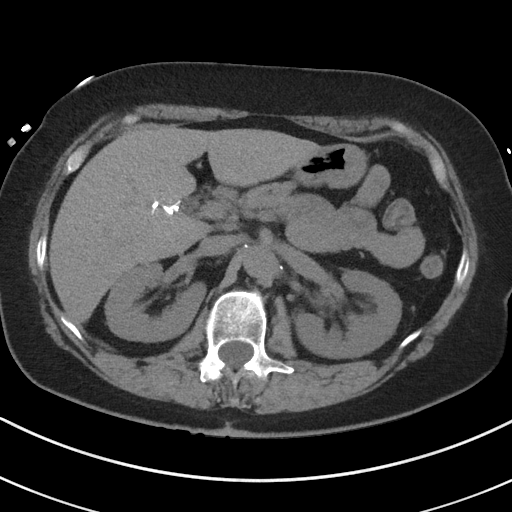
[im 72/89  soft-tissue]
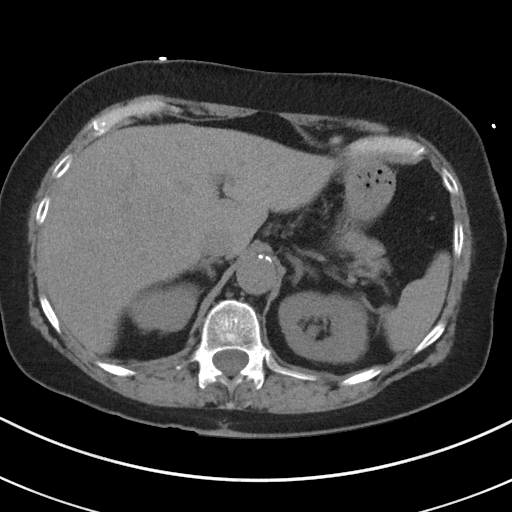
[im 78/89  soft-tissue]
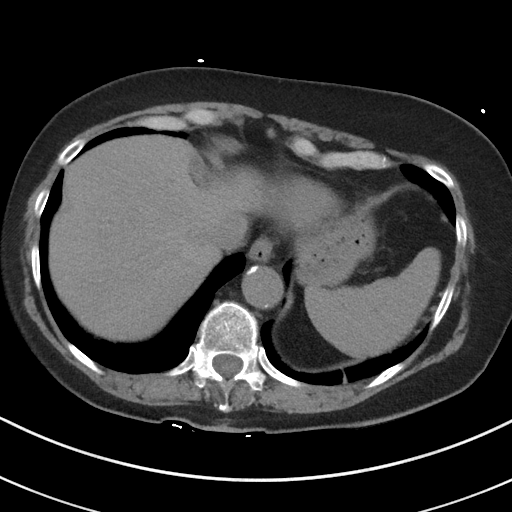
[im 85/89  soft-tissue]
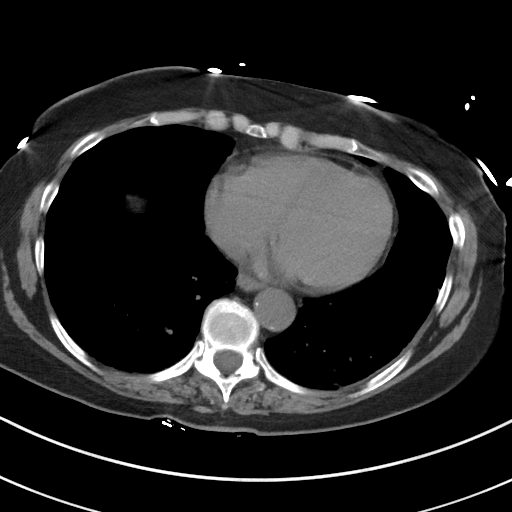

[Series 5: coronal · coronal · 0.74mm/px · 3 of 118 slices shown]
[im 40/118  soft-tissue]
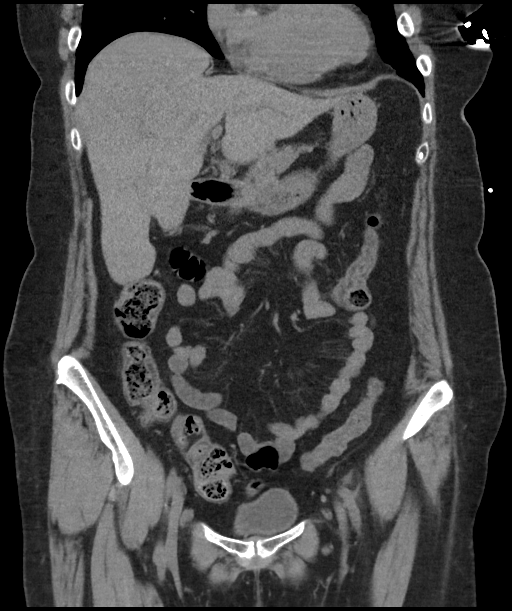
[im 53/118  soft-tissue]
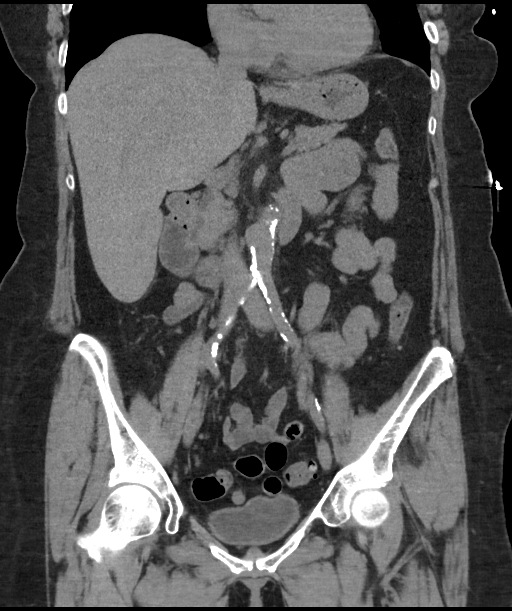
[im 66/118  soft-tissue]
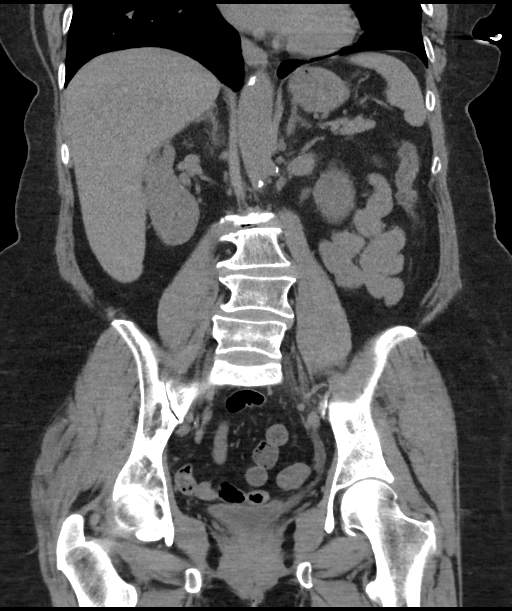

[16 of 46 positions shown; findings below may reference images not displayed]

FINDINGS: Lower chest: Lung bases are clear.

Hepatobiliary: No focal hepatic lesion. Postcholecystectomy. No
biliary dilatation.

Pancreas: Pancreas is normal. No ductal dilatation. No pancreatic
inflammation.

Spleen: Normal spleen

Adrenals/urinary tract: Normal adrenal glands. No nephrolithiasis or
ureterolithiasis. No obstructive uropathy.

At tiny calculus dependent within the bladder measures 2 mm (image
76, series 2).

Stomach/Bowel: Stomach, small bowel, appendix, and cecum are normal.
Several diverticular of the descending colon. Acute inflammation

Vascular/Lymphatic: Abdominal aorta is normal caliber with
atherosclerotic calcification. There is no retroperitoneal or
periportal lymphadenopathy. No pelvic lymphadenopathy.

Reproductive: Post hysterectomy anatomy

Other: No free fluid.

Musculoskeletal: No aggressive osseous lesion.
IMPRESSION: 1. Small calculus within the bladder could represent a passed
ureteral stone.
2. No nephrolithiasis or obstructive uropathy.

## 2016-12-12 SURGERY — ESOPHAGOGASTRODUODENOSCOPY (EGD) WITH PROPOFOL
Anesthesia: General

## 2016-12-12 SURGERY — REMOVAL, FOREIGN BODY
Anesthesia: General

## 2016-12-12 MED ORDER — ONDANSETRON 4 MG PO TBDP: ORAL_TABLET | ORAL | 0 refills | Status: DC

## 2016-12-12 MED ORDER — ONDANSETRON HCL 4 MG/2ML IJ SOLN
4.0000 mg | INTRAMUSCULAR | Status: AC
  Administered 2016-12-12: 4 mg via INTRAVENOUS
  Filled 2016-12-12: qty 2

## 2016-12-12 MED ORDER — SODIUM CHLORIDE 0.9 % IV SOLN: INTRAVENOUS | Status: DC

## 2016-12-12 MED ORDER — DOCUSATE SODIUM 100 MG PO CAPS: ORAL_CAPSULE | ORAL | 0 refills | Status: DC

## 2016-12-12 MED ORDER — HYDROCODONE-ACETAMINOPHEN 5-325 MG PO TABS: 1.0000 | ORAL_TABLET | ORAL | 0 refills | Status: DC | PRN

## 2016-12-12 MED ORDER — HYDROMORPHONE HCL 1 MG/ML IJ SOLN
0.5000 mg | INTRAMUSCULAR | Status: AC
  Administered 2016-12-12: 0.5 mg via INTRAVENOUS
  Filled 2016-12-12: qty 1

## 2016-12-12 NOTE — Discharge Instructions (Signed)
You have been seen in the Emergency Department (ED) today for pain that we believe based on your workup, is caused by kidney stones.  As we have discussed, please drink plenty of fluids.  Please make a follow up appointment with the physician(s) listed elsewhere in this documentation.  You may take pain medication as needed but ONLY as prescribed.  Please also take your prescribed Flomax daily.  We also recommend that you take over-the-counter ibuprofen regularly according to label instructions over the next 5 days.  Take it with meals to minimize stomach discomfort.  Please see your doctor as soon as possible as stones may take 1-3 weeks to pass and you may require additional care or medications.  Do not drink alcohol, drive or participate in any other potentially dangerous activities while taking opiate pain medication as it may make you sleepy. Do not take this medication with any other sedating medications, either prescription or over-the-counter. If you were prescribed Percocet or Vicodin, do not take these with acetaminophen (Tylenol) as it is already contained within these medications.   Take Norco as needed for severe pain.  This medication is an opiate (or narcotic) pain medication and can be habit forming.  Use it as little as possible to achieve adequate pain control.  Do not use or use it with extreme caution if you have a history of opiate abuse or dependence.  If you are on a pain contract with your primary care doctor or a pain specialist, be sure to let them know you were prescribed this medication today from the [facility] Emergency Department.  This medication is intended for your use only - do not give any to anyone else and keep it in a secure place where nobody else, especially children, have access to it.  It will also cause or worsen constipation, so you may want to consider taking an over-the-counter stool softener while you are taking this medication.  Return to the  Emergency Department (ED) or call your doctor if you have any worsening pain, fever, painful urination, are unable to urinate, or develop other symptoms that concern you.  Given the odd tingling sensation you experienced, we recommend that you take a daily baby aspirin, but given no other signs of symptoms of stroke, we agree that no further workup is needed.

## 2016-12-12 NOTE — ED Notes (Signed)
Patient transported to CT 

## 2016-12-12 NOTE — ED Triage Notes (Signed)
Pt started with left side abdominal pain at 815 this morning with nausea r/t pain.  While talking to pt about abdominal pain reports 45 min ago started with left hand tingling/numbness and same symptoms to left side of lips.

## 2016-12-12 NOTE — Consult Note (Signed)
Referring Physician: Karma Greaser    Chief Complaint: Left sided numbness  HPI: Katelyn Bartlett is an 61 y.o. female who reports that this morning she developed left flank pain.  Then later in the morning after arriving at work noted tingling in the fingertips on the left hand and in her bottom lip.  With pain worsening, patient presented for evaluation.  Initial NIHSS of 1.     Date last known well: Date: 12/12/2016 Time last known well: Time: 09:30 tPA Given: No: Minimal symptoms  Past Medical History:  Diagnosis Date  . Hypercholesteremia   . Tachycardia     Past Surgical History:  Procedure Laterality Date  . ABDOMINAL HYSTERECTOMY  1990  . CHOLECYSTECTOMY  1989  . ROTATOR CUFF REPAIR Right 2007  . SKIN CANCER EXCISION  2011   ABOVE LIP    Family History  Problem Relation Age of Onset  . Adopted: Yes  . Family history unknown: Yes   Social History:  reports that she has been smoking Cigarettes.  She has a 15.00 pack-year smoking history. She has never used smokeless tobacco. She reports that she does not drink alcohol or use drugs.  Allergies:  Allergies  Allergen Reactions  . Morphine Sulfate     BP drops    Medications: I have reviewed the patient's current medications. Prior to Admission:  Prior to Admission medications   Medication Sig Start Date End Date Taking? Authorizing Provider  amLODipine (NORVASC) 5 MG tablet TAKE 1 TABLET BY MOUTH DAILY 10/14/16  Yes Chrismon, Vickki Muff, PA  aspirin 325 MG tablet Take 650 mg by mouth daily.    Yes [provider]  metoprolol tartrate (LOPRESSOR) 25 MG tablet TAKE 1 TABLET BY MOUTH TWICE DAILY 10/14/16  Yes Chrismon, Vickki Muff, PA  naproxen sodium (ALEVE) 220 MG tablet Take 1 tablet by mouth as needed.    Yes [provider]  OMEGA-3 FATTY ACIDS PO Take 1 capsule by mouth daily.   Yes [provider]  pravastatin (PRAVACHOL) 40 MG tablet TAKE 1 TABLET BY MOUTH DAILY 10/14/16  Yes Chrismon, Vickki Muff, PA   Diphenhyd-Hydrocort-Nystatin (FIRST-DUKES MOUTHWASH) SUSP One teaspoon gargled the swallowed four times a day. Patient not taking: Reported on 12/12/2016 11/05/16   Chrismon, Vickki Muff, PA  MULTIPLE VITAMIN PO Take 1 tablet by mouth daily.    [provider]     ROS: History obtained from the patient  General ROS: negative for - chills, fatigue, fever, night sweats, weight gain or weight loss Psychological ROS: negative for - behavioral disorder, hallucinations, memory difficulties, mood swings or suicidal ideation Ophthalmic ROS: negative for - blurry vision, double vision, eye pain or loss of vision ENT ROS: negative for - epistaxis, nasal discharge, oral lesions, sore throat, tinnitus or vertigo Allergy and Immunology ROS: negative for - hives or itchy/watery eyes Hematological and Lymphatic ROS: negative for - bleeding problems, bruising or swollen lymph nodes Endocrine ROS: negative for - galactorrhea, hair pattern changes, polydipsia/polyuria or temperature intolerance Respiratory ROS: negative for - cough, hemoptysis, shortness of breath or wheezing Cardiovascular ROS: negative for - chest pain, dyspnea on exertion, edema or irregular heartbeat Gastrointestinal ROS: as noted in HPI Genito-Urinary ROS: negative for - dysuria, hematuria, incontinence or urinary frequency/urgency Musculoskeletal ROS: negative for - joint swelling or muscular weakness Neurological ROS: as noted in HPI Dermatological ROS: negative for rash and skin lesion changes  Physical Examination: Blood pressure (!) 151/89, pulse 68, temperature 98.5 F (36.9 C), temperature source  Oral, resp. rate 18, height 5' 6"  (1.676 m), weight 77.1 kg (170 lb), SpO2 96 %.  HEENT-  Normocephalic, no lesions, without obvious abnormality.  Normal external eye and conjunctiva.  Normal TM's bilaterally.  Normal auditory canals and external ears. Normal external nose, mucus membranes and septum.  Normal  pharynx. Cardiovascular- S1, S2 normal, pulses palpable throughout   Lungs- chest clear, no wheezing, rales, normal symmetric air entry Abdomen- soft, non-tender; bowel sounds normal; no masses,  no organomegaly Extremities- no edema Lymph-no adenopathy palpable Musculoskeletal-no joint tenderness, deformity or swelling Skin-warm and dry, no hyperpigmentation, vitiligo, or suspicious lesions  Neurological Examination   Mental Status: Alert, oriented, thought content appropriate.  Speech fluent without evidence of aphasia.  Able to follow 3 step commands without difficulty. Cranial Nerves: II: Discs flat bilaterally; Visual fields grossly normal, pupils equal, round, reactive to light and accommodation III,IV, VI: ptosis not present, extra-ocular motions intact bilaterally V,VII: smile symmetric, tingling on the bottom lip VIII: hearing normal bilaterally IX,X: gag reflex present XI: bilateral shoulder shrug XII: midline tongue extension Motor: Right : Upper extremity   5/5    Left:     Upper extremity   5/5  Lower extremity   5/5     Lower extremity   5/5 Tone and bulk:normal tone throughout; no atrophy noted Sensory: Pinprick and light touch decreased in the fingertips on the left hand Deep Tendon Reflexes: 2+ and symmetric throughout Plantars: Right: downgoing   Left: downgoing Cerebellar: Normal finger-to-nose and normal heel-to-shin testing bilaterally Gait: not tested due to severe pain    Laboratory Studies:  Basic Metabolic Panel: No results for input(s): NA, K, CL, CO2, GLUCOSE, BUN, CREATININE, CALCIUM, MG, PHOS in the last 168 hours.  Liver Function Tests: No results for input(s): AST, ALT, ALKPHOS, BILITOT, PROT, ALBUMIN in the last 168 hours. No results for input(s): LIPASE, AMYLASE in the last 168 hours. No results for input(s): AMMONIA in the last 168 hours.  CBC: No results for input(s): WBC, NEUTROABS, HGB, HCT, MCV, PLT in the last 168 hours.  Cardiac  Enzymes: No results for input(s): CKTOTAL, CKMB, CKMBINDEX, TROPONINI in the last 168 hours.  BNP: Invalid input(s): POCBNP  CBG:  Recent Labs Lab 12/12/16 1115  GLUCAP 126*    Microbiology: No results found for this or any previous visit.  Coagulation Studies: No results for input(s): LABPROT, INR in the last 72 hours.  Urinalysis: No results for input(s): COLORURINE, LABSPEC, PHURINE, GLUCOSEU, HGBUR, BILIRUBINUR, KETONESUR, PROTEINUR, UROBILINOGEN, NITRITE, LEUKOCYTESUR in the last 168 hours.  Invalid input(s): APPERANCEUR  Lipid Panel:    Component Value Date/Time   CHOL 173 08/27/2016 0848   TRIG 150 (H) 08/27/2016 0848   HDL 41 08/27/2016 0848   CHOLHDL 4.2 08/27/2016 0848   LDLCALC 102 (H) 08/27/2016 0848    HgbA1C: No results found for: HGBA1C  Urine Drug Screen:  No results found for: LABOPIA, COCAINSCRNUR, LABBENZ, AMPHETMU, THCU, LABBARB  Alcohol Level: No results for input(s): ETH in the last 168 hours.   Imaging: Ct Head Code Stroke W/o Cm  Result Date: 12/12/2016 CLINICAL DATA:  Code stroke. Numbness/ tingling in the left hand and lips. EXAM: CT HEAD WITHOUT CONTRAST TECHNIQUE: Contiguous axial images were obtained from the base of the skull through the vertex without intravenous contrast. COMPARISON:  None. FINDINGS: Brain: There is no evidence of acute infarct, intracranial hemorrhage, mass, midline shift, or extra-axial fluid collection. The ventricles and sulci are normal for age. Vascular: Bilateral carotid  siphon atherosclerosis. No hyperdense vessel. Skull: No fracture focal osseous lesion. Sinuses/Orbits: Visualized paranasal sinuses and mastoid air cells are clear. Visualized orbits are unremarkable. Other: None. ASPECTS [facility] Stroke Program Early CT Score) - Ganglionic level infarction (caudate, lentiform nuclei, internal capsule, insula, M1-M3 cortex): 7 - Supraganglionic infarction (M4-M6 cortex): 3 Total score (0-10 with 10 being normal): 10  IMPRESSION: 1. No evidence of acute intracranial abnormality. 2. ASPECTS is 10. These results were called by telephone at the time of interpretation on 12/12/2016 at 11:09 am to Dr. Conni Slipper , who verbally acknowledged these results. Electronically Signed   By: Logan Bores M.D.   On: 12/12/2016 11:10    Assessment: 61 y.o. female presenting with abdominal pain and left sided sensory symptoms.  Head CT reviewed and shows no acute changes.  Patient on ASA and a statin daily.  Although patient may b hyperventilating due to pain will rule out possible ischemic etiology.  Further work up recommended.    Stroke Risk Factors - hyperlipidemia and smoking  Plan: 1. HgbA1c, fasting lipid panel 2. MRI of the brain without contrast.  If unremarkable no further work up recommended.  If acute ischemia noted, stroke work up recommended to include PT consult, OT consult, Speech consult, echocardiogram, carotid dopplers. 3. Prophylactic therapy-Continue ASA 4. NPO until RN stroke swallow screen 5. Telemetry monitoring 6. Frequent neuro checks   Case discussed with Dr. Worthy Rancher, MD Neurology [phone] 12/12/2016, 11:21 AM

## 2016-12-12 NOTE — Telephone Encounter (Signed)
Triage call: Triaged patient calling that around 8:15 this morning she turn around to her left side and she got this severe pain on the left mid abdominal area. She reports that she thought it was going to go away but pain worsening. She has nausea and pain is radiating around mid back with tingling on her finger. Per Simona Huh patient to go the ED, to rule out kidney stone or something else. Patient was on her way here and agree with plan.  Thanks,  -Joseline

## 2016-12-12 NOTE — ED Triage Notes (Signed)
Dr Doy Mince at bedside.

## 2016-12-12 NOTE — ED Notes (Signed)
To ct scanner

## 2016-12-12 NOTE — ED Provider Notes (Signed)
[facility] Emergency Department Provider Note  ____________________________________________   First MD Initiated Contact with Patient 12/12/16 1108     (approximate)  I have reviewed the triage vital signs and the nursing notes.   HISTORY  Chief Complaint Abdominal Pain    HPI Katelyn Bartlett is a 61 y.o. female who presents for evaluation of acute onset severe left-sided abdominal and flank pain which started acutely approximately 3 hoursprior to my evaluation of the patient.  She states that she was in her normal state of health this morning and was suddenly hit with sharp and stabbing pain in the left side of her abdomen and flank.  It is accompanied with nausea but no vomiting.  It feels somewhat similar to one prior episode of kidney stones but that pain was confined to her back.  She denies dysuria.  She denies recent fever/chills, chest pain, shortness of breath, upper abdominal pain, headache.  Of note, the patient also reported that 45 minutes prior to arrival she developed acute onset mild tingling in the left side of her lips and her left hand.  She described it as tingling and numbness and no weakness and as a result she was made a code stroke.  She has not had any word finding difficulties, no slurred speech no obvious facial droop, and no other neurological complaints.  I evaluated her immediately after her head CT and after she was already seen by the neurologist.  See ED course for more information.   Past Medical History:  Diagnosis Date  . Hypercholesteremia   . Tachycardia     Patient Active Problem List   Diagnosis Date Noted  . Supraventricular tachycardia ([place]) 04/21/2015  . Arthropathia 01/16/2015  . Alopecia 01/16/2015  . 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 Benign hypertension 01/16/2015  . Abnormal results of thyroid function studies 01/16/2015  . Abnormal weight gain 01/16/2015  . Raynaud's  syndrome without gangrene 01/16/2015  . Fatigue 01/16/2015    Past Surgical History:  Procedure Laterality Date  . ABDOMINAL HYSTERECTOMY  1990  . CHOLECYSTECTOMY  1989  . ROTATOR CUFF REPAIR Right 2007  . SKIN CANCER EXCISION  2011   ABOVE LIP    Prior to Admission medications   Medication Sig Start Date End Date Taking? Authorizing Provider  amLODipine (NORVASC) 5 MG tablet TAKE 1 TABLET BY MOUTH DAILY 10/14/16  Yes Chrismon, Vickki Muff, PA  aspirin 325 MG tablet Take 650 mg by mouth daily.    Yes [provider]  metoprolol tartrate (LOPRESSOR) 25 MG tablet TAKE 1 TABLET BY MOUTH TWICE DAILY 10/14/16  Yes Chrismon, Vickki Muff, PA  naproxen sodium (ALEVE) 220 MG tablet Take 1 tablet by mouth as needed.    Yes [provider]  OMEGA-3 FATTY ACIDS PO Take 1 capsule by mouth daily.   Yes [provider]  pravastatin (PRAVACHOL) 40 MG tablet TAKE 1 TABLET BY MOUTH DAILY 10/14/16  Yes Chrismon, Vickki Muff, PA  Diphenhyd-Hydrocort-Nystatin (FIRST-DUKES MOUTHWASH) SUSP One teaspoon gargled the swallowed four times a day. Patient not taking: Reported on 12/12/2016 11/05/16   Chrismon, Vickki Muff, PA  docusate sodium (COLACE) 100 MG capsule Take 1 tablet once or twice daily as needed for constipation while taking narcotic pain medicine 12/12/16   Hinda Kehr, MD  HYDROcodone-acetaminophen (NORCO/VICODIN) 5-325 MG tablet Take 1-2 tablets by mouth every 4 (four) hours as needed for moderate pain. 12/12/16   Hinda Kehr, MD  MULTIPLE VITAMIN PO  Take 1 tablet by mouth daily.    [provider]  ondansetron (ZOFRAN ODT) 4 MG disintegrating tablet Allow 1-2 tablets to dissolve in your mouth every 8 hours as needed for nausea/vomiting 12/12/16   Hinda Kehr, MD    Allergies Morphine sulfate  Family History  Problem Relation Age of Onset  . Adopted: Yes  . Family history unknown: Yes    Social History Social History  Substance Use Topics  . Smoking status: Current  Every Day Smoker    Packs/day: 0.50    Years: 30.00    Types: Cigarettes  . Smokeless tobacco: Never Used  . Alcohol use No    Review of Systems Constitutional: No fever/chills Eyes: No visual changes. ENT: No sore throat. Cardiovascular: Denies chest pain. Respiratory: Denies shortness of breath. Gastrointestinal: Acute onset left sided abdominal and flank pain with nausea but no vomiting. Genitourinary: Negative for dysuria and no hematuria Musculoskeletal: Negative for neck pain.  Negative for back pain. Integumentary: Negative for rash. Neurological: Negative for headaches, focal weakness or numbness.   ____________________________________________   PHYSICAL EXAM:  ED Triage Vitals  Enc Vitals Group     BP 12/12/16 1107 (!) 151/89     Pulse Rate 12/12/16 1107 68     Resp 12/12/16 1107 18     Temp 12/12/16 1107 98.5 F (36.9 C)     Temp Source 12/12/16 1107 Oral     SpO2 12/12/16 1107 96 %     Weight 12/12/16 1050 77.1 kg (170 lb)     Height 12/12/16 1050 1.676 m (5' 6" )     Head Circumference --      Peak Flow --      Pain Score 12/12/16 1050 7     Pain Loc --      Pain Edu? --      Excl. in [place]? --     Constitutional: Alert and oriented. Appears to be in severe pain Eyes: Conjunctivae are normal.  Head: Atraumatic. Nose: No congestion/rhinnorhea. Mouth/Throat: Mucous membranes are moist. Neck: No stridor.  No meningeal signs.   Cardiovascular: Normal rate, regular rhythm. Good peripheral circulation. Grossly normal heart sounds. Respiratory: Normal respiratory effort.  No retractions. Lungs CTAB. Gastrointestinal: Soft with mild tenderness to palpation of the left side of abdomen, moderate left CVA tenderness.  No distention, no guarding, no rebound.  No right sided tenderness. Musculoskeletal: No lower extremity tenderness nor edema. No gross deformities of extremities. Neurologic:  Normal speech and language. No gross focal neurologic deficits are  appreciated although the patient does report numbness in her left hand as well as in her left lips.  However she has no gross cranial nerve deficits, equal grip strength bilaterally, equal upper extremity and lower extremity strength, no drift, and an NIH stroke scale as documented below. Skin:  Skin is warm, dry and intact. No rash noted.   ____________________________________________   LABS (all labs ordered are listed, but only abnormal results are displayed)  Labs Reviewed  CBC - Abnormal; Notable for the following:       Result Value   Hemoglobin 16.1 (*)    All other components within normal limits  DIFFERENTIAL - Abnormal; Notable for the following:    Neutro Abs 7.3 (*)    All other components within normal limits  COMPREHENSIVE METABOLIC PANEL - Abnormal; Notable for the following:    Glucose, Bld 122 (*)    All other components within normal limits  GLUCOSE, CAPILLARY - Abnormal; Notable  for the following:    Glucose-Capillary 126 (*)    All other components within normal limits  URINALYSIS, ROUTINE W REFLEX MICROSCOPIC - Abnormal; Notable for the following:    Color, Urine YELLOW (*)    APPearance CLEAR (*)    Hgb urine dipstick MODERATE (*)    Squamous Epithelial / LPF 0-5 (*)    All other components within normal limits  PROTIME-INR  APTT  TROPONIN I  CBG MONITORING, ED   ____________________________________________  RADIOLOGY   Ct Renal Stone Study  Addendum Date: 12/12/2016   ADDENDUM REPORT: 12/12/2016 12:22 ADDENDUM: Voice recognition error:  Findings section: Stomach/Bowel: Stomach, small bowel, appendix, and cecum are normal. Several diverticular of the descending colon. NO Acute inflammation. Electronically Signed   By: Suzy Bouchard M.D.   On: 12/12/2016 12:22   Result Date: 12/12/2016 CLINICAL DATA:  LEFT flank pain which started in the morning. Nausea. EXAM: CT ABDOMEN AND PELVIS WITHOUT CONTRAST TECHNIQUE: Multidetector CT imaging of the abdomen  and pelvis was performed following the standard protocol without IV contrast. COMPARISON:  None. FINDINGS: Lower chest: Lung bases are clear. Hepatobiliary: No focal hepatic lesion. Postcholecystectomy. No biliary dilatation. Pancreas: Pancreas is normal. No ductal dilatation. No pancreatic inflammation. Spleen: Normal spleen Adrenals/urinary tract: Normal adrenal glands. No nephrolithiasis or ureterolithiasis. No obstructive uropathy. At tiny calculus dependent within the bladder measures 2 mm (image 76, series 2). Stomach/Bowel: Stomach, small bowel, appendix, and cecum are normal. Several diverticular of the descending colon. Acute inflammation Vascular/Lymphatic: Abdominal aorta is normal caliber with atherosclerotic calcification. There is no retroperitoneal or periportal lymphadenopathy. No pelvic lymphadenopathy. Reproductive: Post hysterectomy anatomy Other: No free fluid. Musculoskeletal: No aggressive osseous lesion. IMPRESSION: 1. Small calculus within the bladder could represent a passed ureteral stone. 2. No nephrolithiasis or obstructive uropathy. Electronically Signed: By: Suzy Bouchard M.D. On: 12/12/2016 12:15   Ct Head Code Stroke W/o Cm  Result Date: 12/12/2016 CLINICAL DATA:  Code stroke. Numbness/ tingling in the left hand and lips. EXAM: CT HEAD WITHOUT CONTRAST TECHNIQUE: Contiguous axial images were obtained from the base of the skull through the vertex without intravenous contrast. COMPARISON:  None. FINDINGS: Brain: There is no evidence of acute infarct, intracranial hemorrhage, mass, midline shift, or extra-axial fluid collection. The ventricles and sulci are normal for age. Vascular: Bilateral carotid siphon atherosclerosis. No hyperdense vessel. Skull: No fracture focal osseous lesion. Sinuses/Orbits: Visualized paranasal sinuses and mastoid air cells are clear. Visualized orbits are unremarkable. Other: None. ASPECTS [facility] Stroke Program Early CT Score) - Ganglionic level  infarction (caudate, lentiform nuclei, internal capsule, insula, M1-M3 cortex): 7 - Supraganglionic infarction (M4-M6 cortex): 3 Total score (0-10 with 10 being normal): 10 IMPRESSION: 1. No evidence of acute intracranial abnormality. 2. ASPECTS is 10. These results were called by telephone at the time of interpretation on 12/12/2016 at 11:09 am to Dr. Conni Slipper , who verbally acknowledged these results. Electronically Signed   By: Logan Bores M.D.   On: 12/12/2016 11:10    ____________________________________________   PROCEDURES  Critical Care performed: Yes, see critical care procedure note(s)   Procedure(s) performed:   Procedures   ____________________________________________   INITIAL IMPRESSION / ASSESSMENT AND PLAN / ED COURSE  Pertinent labs & imaging results that were available during my care of the patient were reviewed by me and considered in my medical decision making (see chart for details).  NIH Stroke Scale  Interval: Baseline Time: 11:15 AM Person Administering Scale: Loreley Schwall  Administer stroke scale  items in the order listed. Record performance in each category after each subscale exam. Do not go back and change scores. Follow directions provided for each exam technique. Scores should reflect what the patient does, not what the clinician thinks the patient can do. The clinician should record answers while administering the exam and work quickly. Except where indicated, the patient should not be coached (i.e., repeated requests to patient to make a special effort).   1a  Level of consciousness: 0=alert; keenly responsive  1b. LOC questions:  0=Performs both tasks correctly  1c. LOC commands: 0=Performs both tasks correctly  2.  Best Gaze: 0=normal  3.  Visual: 0=No visual loss  4. Facial Palsy: 0=Normal symmetric movement  5a.  Motor left arm: 0=No drift, limb holds 90 (or 45) degrees for full 10 seconds  5b.  Motor right arm: 0=No drift, limb holds  90 (or 45) degrees for full 10 seconds  6a. motor left leg: 0=No drift, limb holds 90 (or 45) degrees for full 10 seconds  6b  Motor right leg:  0=No drift, limb holds 90 (or 45) degrees for full 10 seconds  7. Limb Ataxia: 0=Absent  8.  Sensory: 1=Mild to moderate sensory loss; patient feels pinprick is less sharp or is dull on the affected side; there is a loss of superficial pain with pinprick but patient is aware She is being touched  9. Best Language:  0=No aphasia, normal  10. Dysarthria: 0=Normal  11. Extinction and Inattention: 0=No abnormality  12. Distal motor function: 0=Normal   Total:   1      Clinical Course as of Dec 12 2304  Thu Dec 12, 2016  1140 Delayed documentation due to multiple issues in the department simultaneously. in short, the patient presents with acute onset flank pain is most consistent with a kidney stone. However because of some sensory complaints in her left upper extremity and the left side of her lips, a code stroke was called in triage as per protocol. Dr. Doy Mince and I agree that the patient has at most an NIH stroke scale of 1 due to her sensory abnormalities, but I think this may be the result of hyperventilation due to her acute pain. I suspect her symptoms will resolve after pain medication. She is not a candidate for TPA due to her low NIHSS and alternative and more likely diagnoses. I will evaluate as a kidney stone and reassess.  [CF]  6962 Probable passed ureter stone in the bladder with no bowel inflammation.  I clarified with the radiologist that the initial report was erroneous and that there is in fact no acute inflammation of the bowel.  I will update the patient shortly and discuss the plan. CT Renal Stone Study [CF]  1404 The patient states that her pain is completely resolved.  She is happy and alert and oriented and able to ambulate without difficulty to the bathroom.  I discussed with her the apparent stone that is seen in her bladder.  We  also discussed the code stroke plan.  She said that the tingling (not numbness) that was present in her hand has completely resolved after her pain resolved and she does not want an MRI unless it is absolutely necessary.  I agree that it is not necessary and to the circumstances and in calling to discuss the case again with Dr. Doy Mince but the patient and I are in agreement that it would not be beneficial for her at this time.  We  will check a urinalysis given the nature of her initial presenting complaint and then anticipate discharge with outpatient follow-up.  [CF]  1458 Unremarkable urinalysis other than hemoglobin which supports our diagnosis.  [CF]    Clinical Course User Index [CF] Hinda Kehr, MD    ____________________________________________  FINAL CLINICAL IMPRESSION(S) / ED DIAGNOSES  Final diagnoses:  Kidney stones  Left flank pain     MEDICATIONS GIVEN DURING THIS VISIT:  Medications  ondansetron (ZOFRAN) injection 4 mg (4 mg Intravenous Given 12/12/16 1127)  HYDROmorphone (DILAUDID) injection 0.5 mg (0.5 mg Intravenous Given 12/12/16 1127)     NEW OUTPATIENT MEDICATIONS STARTED DURING THIS VISIT:  Discharge Medication List as of 12/12/2016  3:04 PM    START taking these medications   Details  docusate sodium (COLACE) 100 MG capsule Take 1 tablet once or twice daily as needed for constipation while taking narcotic pain medicine, Print    HYDROcodone-acetaminophen (NORCO/VICODIN) 5-325 MG tablet Take 1-2 tablets by mouth every 4 (four) hours as needed for moderate pain., Starting Thu 12/12/2016, Print    ondansetron (ZOFRAN ODT) 4 MG disintegrating tablet Allow 1-2 tablets to dissolve in your mouth every 8 hours as needed for nausea/vomiting, Print        Discharge Medication List as of 12/12/2016  3:04 PM      Discharge Medication List as of 12/12/2016  3:04 PM       Note:  This document was prepared using Dragon voice recognition software and may include  unintentional dictation errors.    Hinda Kehr, MD 12/12/16 2306

## 2017-01-13 ENCOUNTER — Other Ambulatory Visit: Payer: Self-pay | Admitting: Family Medicine

## 2017-02-24 ENCOUNTER — Ambulatory Visit (INDEPENDENT_AMBULATORY_CARE_PROVIDER_SITE_OTHER): Payer: Self-pay | Admitting: Family Medicine

## 2017-02-24 ENCOUNTER — Encounter: Payer: Self-pay | Admitting: Family Medicine

## 2017-02-24 VITALS — BP 104/72 | HR 64 | Temp 98.1°F | Resp 16 | Wt 173.0 lb

## 2017-02-24 DIAGNOSIS — E782 Mixed hyperlipidemia: Secondary | ICD-10-CM

## 2017-02-24 DIAGNOSIS — I10 Essential (primary) hypertension: Secondary | ICD-10-CM

## 2017-02-24 NOTE — Progress Notes (Signed)
Katelyn Bartlett  MRN: [id number] DOB: 1955/08/03  Subjective:  HPI   Patient is here for 6 months follow up. Last routine office visit was on 08/26/16. Since then patient was seen for acute visits. HTN: patient is checking her b/p sometimes when her heart rate feels high. Not sure of the readings at that time but usually below 120s over 70. No chest pain or tightness or shortness of breath.  BP Readings from Last 3 Encounters:  02/24/17 104/72  12/12/16 (!) 108/97  11/05/16 94/62   Wt Readings from Last 3 Encounters:  02/24/17 173 lb (78.5 kg)  12/12/16 170 lb (77.1 kg)  11/05/16 169 lb 9.6 oz (76.9 kg)   Hyperlipidemia: Patient is taking Pravastatin daily and is not having side effects.  Lab Results  Component Value Date   CHOL 173 08/27/2016   HDL 41 08/27/2016   LDLCALC 102 (H) 08/27/2016   TRIG 150 (H) 08/27/2016   CHOLHDL 4.2 08/27/2016     Patient Active Problem List   Diagnosis Date Noted  . Supraventricular tachycardia ([place]) 04/21/2015  . Arthropathia 01/16/2015  . Alopecia 01/16/2015  . 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 Benign hypertension 01/16/2015  . Abnormal results of thyroid function studies 01/16/2015  . Abnormal weight gain 01/16/2015  . Raynaud's syndrome without gangrene 01/16/2015  . Fatigue 01/16/2015    Past Medical History:  Diagnosis Date  . Hypercholesteremia   . Tachycardia     Social History   Social History  . Marital status: Widowed    Spouse name: N/A  . Number of children: N/A  . Years of education: N/A   Occupational History  . Not on file.   Social History Main Topics  . Smoking status: Current Every Day Smoker    Packs/day: 0.50    Years: 30.00    Types: Cigarettes  . Smokeless tobacco: Never Used  . Alcohol use No  . Drug use: No  . Sexual activity: Not on file   Other Topics Concern  . Not on file   Social History Narrative  . No narrative on file    Outpatient Encounter  Prescriptions as of 02/24/2017  Medication Sig  . amLODipine (NORVASC) 5 MG tablet TAKE 1 TABLET BY MOUTH DAILY  . aspirin 325 MG tablet Take 650 mg by mouth daily.   . metoprolol tartrate (LOPRESSOR) 25 MG tablet TAKE 1 TABLET BY MOUTH TWICE DAILY  . MULTIPLE VITAMIN PO Take 1 tablet by mouth daily.  . OMEGA-3 FATTY ACIDS PO Take 1 capsule by mouth daily.  . pravastatin (PRAVACHOL) 40 MG tablet TAKE 1 TABLET BY MOUTH DAILY  . naproxen sodium (ALEVE) 220 MG tablet Take 1 tablet by mouth as needed.   . [DISCONTINUED] Diphenhyd-Hydrocort-Nystatin (FIRST-DUKES MOUTHWASH) SUSP One teaspoon gargled the swallowed four times a day. (Patient not taking: Reported on 12/12/2016)  . [DISCONTINUED] docusate sodium (COLACE) 100 MG capsule Take 1 tablet once or twice daily as needed for constipation while taking narcotic pain medicine  . [DISCONTINUED] HYDROcodone-acetaminophen (NORCO/VICODIN) 5-325 MG tablet Take 1-2 tablets by mouth every 4 (four) hours as needed for moderate pain.  . [DISCONTINUED] ondansetron (ZOFRAN ODT) 4 MG disintegrating tablet Allow 1-2 tablets to dissolve in your mouth every 8 hours as needed for nausea/vomiting   No facility-administered encounter medications on file as of 02/24/2017.     Allergies  Allergen Reactions  . Morphine Sulfate     BP drops    Review of Systems  Constitutional: Negative.   Respiratory: Negative.   Cardiovascular: Negative.   Musculoskeletal: Negative.   Neurological: Negative.     Objective:  BP 104/72   Pulse 64   Temp 98.1 F (36.7 C)   Resp 16   Wt 173 lb (78.5 kg)   BMI 27.92 kg/m   Physical Exam  Constitutional: She is oriented to person, place, and time and well-developed, well-nourished, and in no distress.  HENT:  Head: Normocephalic.  Mouth/Throat: Oropharynx is clear and moist.  Eyes: Conjunctivae are normal.  Neck: No thyromegaly present.  Cardiovascular: Normal rate and regular rhythm.   Pulmonary/Chest: Effort normal and  breath sounds normal.  Abdominal: Soft. Bowel sounds are normal.  Neurological: She is alert and oriented to person, place, and time. Gait normal.  Psychiatric: Affect and judgment normal.    Assessment and Plan :  1. Benign hypertension Good control. Tolerating Metoprolol and Amlodipine without side effects. Renal function normal 12-12-16. Continue to work on smoking cessation and recheck in 6 months.  2. Combined fat and carbohydrate induced hyperlipemia Tolerating Pravastatin 40 mg qd without side effects with Omega-3 Fish Oil qd. Continue low fat diet and work on some weight loss. Recheck in 6 months.

## 2017-08-28 ENCOUNTER — Encounter: Payer: Self-pay | Admitting: Family Medicine

## 2017-08-28 ENCOUNTER — Ambulatory Visit: Payer: Self-pay | Admitting: Family Medicine

## 2017-08-28 VITALS — BP 102/78 | HR 74 | Temp 98.2°F | Wt 175.8 lb

## 2017-08-28 DIAGNOSIS — R197 Diarrhea, unspecified: Secondary | ICD-10-CM

## 2017-08-28 DIAGNOSIS — Z1211 Encounter for screening for malignant neoplasm of colon: Secondary | ICD-10-CM

## 2017-08-28 DIAGNOSIS — Z114 Encounter for screening for human immunodeficiency virus [HIV]: Secondary | ICD-10-CM

## 2017-08-28 DIAGNOSIS — Z2889 Immunization not carried out for other reason: Secondary | ICD-10-CM

## 2017-08-28 DIAGNOSIS — J069 Acute upper respiratory infection, unspecified: Secondary | ICD-10-CM

## 2017-08-28 DIAGNOSIS — I10 Essential (primary) hypertension: Secondary | ICD-10-CM

## 2017-08-28 DIAGNOSIS — Z1159 Encounter for screening for other viral diseases: Secondary | ICD-10-CM

## 2017-08-28 DIAGNOSIS — E782 Mixed hyperlipidemia: Secondary | ICD-10-CM

## 2017-08-28 NOTE — Progress Notes (Signed)
Patient: Katelyn Bartlett Female    DOB: 11-30-1955   62 y.o.   MRN: [id number] Visit Date: 08/28/2017  Today's Provider: Vernie Murders, PA   Chief Complaint  Patient presents with  . Hypertension  . Hyperlipidemia  . Follow-up   Subjective:    HPI  Hypertension, follow-up:  BP Readings from Last 3 Encounters:  08/28/17 102/78  02/24/17 104/72  12/12/16 (!) 108/97    She was last seen for hypertension 6 months ago.  BP at that visit was 104/72. Management changes since that visit include continue medications She reports good compliance with treatment. She is not having side effects.  She is not exercising. She is not adherent to low salt diet.   Outside blood pressures are being checked occasionally. She is experiencing none.  Patient denies chest pain, chest pressure/discomfort, claudication, dyspnea, exertional chest pressure/discomfort, fatigue, irregular heart beat, lower extremity edema, near-syncope, orthopnea, palpitations, paroxysmal nocturnal dyspnea, syncope and tachypnea.   Cardiovascular risk factors include dyslipidemia and smoking/ tobacco exposure.  Use of agents associated with hypertension: none.     Weight trend: stable Wt Readings from Last 3 Encounters:  08/28/17 175 lb 12.8 oz (79.7 kg)  02/24/17 173 lb (78.5 kg)  12/12/16 170 lb (77.1 kg)    Current diet: on average, 1 meals per day  ------------------------------------------------------------------------ Past Medical History:  Diagnosis Date  . Hypercholesteremia   . Tachycardia    Past Surgical History:  Procedure Laterality Date  . ABDOMINAL HYSTERECTOMY  1990  . CHOLECYSTECTOMY  1989  . ROTATOR CUFF REPAIR Right 2007  . SKIN CANCER EXCISION  2011   ABOVE LIP   Family History  Adopted: Yes  Family history unknown: Yes   Allergies  Allergen Reactions  . Morphine Sulfate     BP drops    Current Outpatient Medications:  .  amLODipine (NORVASC) 5 MG tablet, TAKE 1  TABLET BY MOUTH DAILY, Disp: 90 tablet, Rfl: 3 .  aspirin 325 MG tablet, Take 325 mg by mouth daily. , Disp: , Rfl:  .  metoprolol tartrate (LOPRESSOR) 25 MG tablet, TAKE 1 TABLET BY MOUTH TWICE DAILY, Disp: 180 tablet, Rfl: 3 .  MULTIPLE VITAMIN PO, Take 1 tablet by mouth daily., Disp: , Rfl:  .  naproxen sodium (ALEVE) 220 MG tablet, Take 1 tablet by mouth as needed. , Disp: , Rfl:  .  OMEGA-3 FATTY ACIDS PO, Take 1 capsule by mouth daily., Disp: , Rfl:  .  pravastatin (PRAVACHOL) 40 MG tablet, TAKE 1 TABLET BY MOUTH DAILY, Disp: 90 tablet, Rfl: 3  Review of Systems  Constitutional: Negative.   HENT: Positive for congestion.   Respiratory: Positive for cough.   Cardiovascular: Negative.   Gastrointestinal: Positive for diarrhea. Negative for blood in stool, nausea and vomiting.   Social History   Tobacco Use  . Smoking status: Current Every Day Smoker    Packs/day: 0.50    Years: 30.00    Pack years: 15.00    Types: Cigarettes  . Smokeless tobacco: Never Used  Substance Use Topics  . Alcohol use: No    Alcohol/week: 0.0 oz   Objective:   BP 102/78 (BP Location: Right Arm, Patient Position: Sitting, Cuff Size: Normal)   Pulse 74   Temp 98.2 F (36.8 C) (Oral)   Wt 175 lb 12.8 oz (79.7 kg)   SpO2 96%   BMI 28.37 kg/m   Physical Exam  Constitutional: She is oriented to person, place,  and time. She appears well-developed and well-nourished. No distress.  HENT:  Head: Normocephalic and atraumatic.  Right Ear: Hearing and external ear normal.  Left Ear: Hearing and external ear normal.  Nose: Nose normal.  Eyes: Conjunctivae and lids are normal. Right eye exhibits no discharge. Left eye exhibits no discharge. No scleral icterus.  Neck: Neck supple.  Cardiovascular: Normal rate.  Pulmonary/Chest: Effort normal and breath sounds normal. No respiratory distress.  Abdominal: Soft. Bowel sounds are normal.  Musculoskeletal: Normal range of motion.  Neurological: She is alert  and oriented to person, place, and time.  Skin: Skin is intact. No lesion and no rash noted.  Psychiatric: She has a normal mood and affect. Her speech is normal and behavior is normal. Thought content normal.      Assessment & Plan:     1. Benign hypertension Well controlled on the Metoprolol tartrate 25 mg BID and Amlodipine 5 mg qd. Recheck CBC, CMP and TSH. Recheck pending lab reports. - CBC with Differential/Platelet - Comprehensive metabolic panel - TSH  2. Combined fat and carbohydrate induced hyperlipemia Tolerating Pravastatin 40 mg qd. No muscle pains. Continue low fat diet and recheck labs. - Comprehensive metabolic panel - Lipid panel - TSH  3. URI with cough and congestion Recent onset without fever. Some PND symptoms with cough. Suspect viral illness. Check CBC to rule out infection. May use Mucinex-DM and Tylenol prn. Recheck prn. - CBC with Differential/Platelet  4. Diarrhea, unspecified type  Having 6-7 small loose stools sine the 1980's. No fever, melena, cramps, mucus stools or hematochezia. Some slight help with using Imodium. No help from probiotics. Recommend referral to gastroenterologist and may need colonoscopy. Check routine labs for signs of dehydration, anemia or other metabolic disorders. - CBC with Differential/Platelet - Comprehensive metabolic panel - TSH  5. Colon cancer screening Encouraged to schedule colonoscopy with history of chronic diarrhea and cancer screening. Does not want to get it done now.  6. Immunization not carried out for other reason Declined immunizations due to cost.  7. Need for hepatitis C screening test - Hepatitis C Antibody  8. Encounter for screening for HIV - HIV antibody       Vernie Murders, PA  [facility]

## 2017-08-30 LAB — CBC WITH DIFFERENTIAL/PLATELET
BASOS ABS: 0 10*3/uL (ref 0.0–0.2)
Basos: 0 %
EOS (ABSOLUTE): 0.1 10*3/uL (ref 0.0–0.4)
EOS: 1 %
HEMATOCRIT: 44.2 % (ref 34.0–46.6)
HEMOGLOBIN: 15.3 g/dL (ref 11.1–15.9)
IMMATURE GRANULOCYTES: 0 %
Immature Grans (Abs): 0 10*3/uL (ref 0.0–0.1)
LYMPHS: 32 %
Lymphocytes Absolute: 1.6 10*3/uL (ref 0.7–3.1)
MCH: 31.9 pg (ref 26.6–33.0)
MCHC: 34.6 g/dL (ref 31.5–35.7)
MCV: 92 fL (ref 79–97)
MONOCYTES: 9 %
Monocytes Absolute: 0.5 10*3/uL (ref 0.1–0.9)
NEUTROS PCT: 58 %
Neutrophils Absolute: 2.8 10*3/uL (ref 1.4–7.0)
Platelets: 204 10*3/uL (ref 150–379)
RBC: 4.79 x10E6/uL (ref 3.77–5.28)
RDW: 13.1 % (ref 12.3–15.4)
WBC: 4.9 10*3/uL (ref 3.4–10.8)

## 2017-08-30 LAB — COMPREHENSIVE METABOLIC PANEL
ALBUMIN: 4.5 g/dL (ref 3.6–4.8)
ALK PHOS: 74 IU/L (ref 39–117)
ALT: 34 IU/L — ABNORMAL HIGH (ref 0–32)
AST: 27 IU/L (ref 0–40)
Albumin/Globulin Ratio: 1.8 (ref 1.2–2.2)
BUN / CREAT RATIO: 16 (ref 12–28)
BUN: 10 mg/dL (ref 8–27)
Bilirubin Total: 0.4 mg/dL (ref 0.0–1.2)
CALCIUM: 9.3 mg/dL (ref 8.7–10.3)
CO2: 18 mmol/L — AB (ref 20–29)
CREATININE: 0.61 mg/dL (ref 0.57–1.00)
Chloride: 105 mmol/L (ref 96–106)
GFR calc Af Amer: 112 mL/min/{1.73_m2} (ref >59)
GFR calc non Af Amer: 97 mL/min/{1.73_m2} (ref >59)
GLOBULIN, TOTAL: 2.5 g/dL (ref 1.5–4.5)
GLUCOSE: 113 mg/dL — AB (ref 65–99)
Potassium: 4.2 mmol/L (ref 3.5–5.2)
SODIUM: 142 mmol/L (ref 134–144)
Total Protein: 7 g/dL (ref 6.0–8.5)

## 2017-08-30 LAB — HEPATITIS C ANTIBODY: Hep C Virus Ab: <0.1 s/co ratio (ref 0.0–0.9)

## 2017-08-30 LAB — LIPID PANEL
CHOL/HDL RATIO: 4.2 ratio (ref 0.0–4.4)
CHOLESTEROL TOTAL: 154 mg/dL (ref 100–199)
HDL: 37 mg/dL — ABNORMAL LOW (ref >39)
LDL CALC: 88 mg/dL (ref 0–99)
TRIGLYCERIDES: 147 mg/dL (ref 0–149)
VLDL CHOLESTEROL CAL: 29 mg/dL (ref 5–40)

## 2017-08-30 LAB — HIV ANTIBODY (ROUTINE TESTING W REFLEX): HIV SCREEN 4TH GENERATION: NONREACTIVE

## 2017-08-30 LAB — TSH: TSH: 4.45 u[IU]/mL (ref 0.450–4.500)

## 2017-09-01 ENCOUNTER — Telehealth: Payer: Self-pay | Admitting: Family Medicine

## 2017-09-01 DIAGNOSIS — K529 Noninfective gastroenteritis and colitis, unspecified: Secondary | ICD-10-CM

## 2017-09-01 NOTE — Telephone Encounter (Signed)
Returning call for labwork

## 2017-09-02 NOTE — Telephone Encounter (Signed)
Pt called and notified about labs will place referal

## 2017-09-16 ENCOUNTER — Ambulatory Visit: Payer: BLUE CROSS/BLUE SHIELD | Admitting: Gastroenterology

## 2017-09-16 ENCOUNTER — Encounter: Payer: Self-pay | Admitting: Gastroenterology

## 2017-09-16 VITALS — BP 109/66 | HR 85 | Temp 98.1°F | Ht 66.0 in | Wt 174.6 lb

## 2017-09-16 DIAGNOSIS — K529 Noninfective gastroenteritis and colitis, unspecified: Secondary | ICD-10-CM

## 2017-09-16 DIAGNOSIS — K580 Irritable bowel syndrome with diarrhea: Secondary | ICD-10-CM

## 2017-09-16 MED ORDER — DICYCLOMINE HCL 10 MG PO CAPS: 10.0000 mg | ORAL_CAPSULE | Freq: Three times a day (TID) | ORAL | 1 refills | Status: DC

## 2017-09-16 NOTE — Progress Notes (Signed)
Cephas Darby, MD [address]  [place]  [place], [place] [address]  Main: [phone]  Fax: [phone]    Gastroenterology Consultation  Referring Provider:     Margo Common, PA Primary Care Physician:  Margo Common, PA Primary Gastroenterologist:  Dr. Cephas Darby Reason for Consultation:     Chronic diarrhea        HPI:   Katelyn Bartlett is a 62 y.o. Caucasian female referred by Dr. Margo Common, PA  for consultation & management of chronic diarrhea. She recalls having initial onset of symptoms in 1982, she was told that she had parasite at that time which was treated. Followed by that she had few years of alternating constipation and diarrhea with abdominal cramps and was told that she has irritable bowel syndrome. She underwent a laparoscopic cholecystectomy in 1989 due to symptomatic cholelithiasis. She was asymptomatic for 2 years and then her symptoms recurred. Over the last several years she has been experiencing frequent bowel movements,3-5 times daily associated with abdominal cramps She denies having constipation. She maintained a log of her bowel movements in last 1 week which she shared with me today. Stools are variable in consistency anywhere from loose to soft, semi-formed, nonbloody, sometimes associated with cramps, sometimes postprandial. She reports not losing weight all these years. She is a chronic tobacco smoker, smokes less than a pack a day. She denies nausea, vomiting, abdominal pain. She tried Imodium in the past. She found temporary benefit from probiotics. She has been taking aspirin 325 mg daily for last 1 year or so recommended by her primary care provider. She denies taking naproxen which was listed on her medications. She is waiting to turn 64 so that she gets Medicaid to undergo colonoscopy. She did not have any kind of stool studies performed. She has normal CBC, TSH, CMP, HIV nonreactive  NSAIDs: aspirin 325 mg daily as  prophylaxis  Antiplts/Anticoagulants/Anti thrombotics: none  GI Procedures: none She is adopted  Past Medical History:  Diagnosis Date  . Hypercholesteremia   . Tachycardia     Past Surgical History:  Procedure Laterality Date  . ABDOMINAL HYSTERECTOMY  1990  . CHOLECYSTECTOMY  1989  . ROTATOR CUFF REPAIR Right 2007  . SKIN CANCER EXCISION  2011   ABOVE LIP     Current Outpatient Medications:  .  amLODipine (NORVASC) 5 MG tablet, TAKE 1 TABLET BY MOUTH DAILY, Disp: 90 tablet, Rfl: 3 .  aspirin 325 MG tablet, Take 325 mg by mouth daily. , Disp: , Rfl:  .  metoprolol tartrate (LOPRESSOR) 25 MG tablet, TAKE 1 TABLET BY MOUTH TWICE DAILY, Disp: 180 tablet, Rfl: 3 .  MULTIPLE VITAMIN PO, Take 1 tablet by mouth daily., Disp: , Rfl:  .  OMEGA-3 FATTY ACIDS PO, Take 1 capsule by mouth daily., Disp: , Rfl:  .  pravastatin (PRAVACHOL) 40 MG tablet, TAKE 1 TABLET BY MOUTH DAILY, Disp: 90 tablet, Rfl: 3 .  dicyclomine (BENTYL) 10 MG capsule, Take 1 capsule (10 mg total) by mouth 4 (four) times daily -  before meals and at bedtime for 30 doses., Disp: 30 capsule, Rfl: 1   Family History  Adopted: Yes  Family history unknown: Yes     Social History   Tobacco Use  . Smoking status: Current Every Day Smoker    Packs/day: 0.50    Years: 30.00    Pack years: 15.00    Types: Cigarettes  . Smokeless tobacco: Never Used  Substance Use Topics  . Alcohol use: No    Alcohol/week: 0.0 oz  . Drug use: No    Allergies as of 09/16/2017 - Review Complete 09/16/2017  Allergen Reaction Noted  . Morphine sulfate  01/16/2015    Review of Systems:    All systems reviewed and negative except where noted in HPI.   Physical Exam:  BP 109/66   Pulse 85   Temp 98.1 F (36.7 C) (Oral)   Ht 5' 6"  (1.676 m)   Wt 174 lb 9.6 oz (79.2 kg)   BMI 28.18 kg/m  No LMP recorded. Patient has had a hysterectomy.  General:   Alert,  Well-developed, well-nourished, pleasant and cooperative in  NAD Head:  Normocephalic and atraumatic. Eyes:  Sclera clear, no icterus.   Conjunctiva pink. Ears:  Normal auditory acuity. Nose:  No deformity, discharge, or lesions. Mouth:  No deformity or lesions,oropharynx pink & moist. Neck:  Supple; no masses or thyromegaly. Lungs:  Respirations even and unlabored.  Clear throughout to auscultation.   No wheezes, crackles, or rhonchi. No acute distress. Heart:  Regular rate and rhythm; no murmurs, clicks, rubs, or gallops. Abdomen:  Normal bowel sounds. Soft, non-tender and non-distended without masses, hepatosplenomegaly or hernias noted.  No guarding or rebound tenderness.   Rectal: Not performed Msk:  Symmetrical without gross deformities. Good, equal movement & strength bilaterally. Pulses:  Normal pulses noted. Extremities:  No clubbing or edema.  No cyanosis. Neurologic:  Alert and oriented x3;  grossly normal neurologically. Skin:  Intact without significant lesions or rashes. No jaundice. Psych:  Alert and cooperative. Normal mood and affect.  Imaging Studies: reviewed  Assessment and Plan:   Katelyn Bartlett is a 62 y.o. Caucasian female with no significant past medical history, status post laparoscopic cholecystectomy in 1989, presents with more than 20 years history of chronic nonbloody diarrhea with abdominal cramps. She does not have alarming signs or symptoms otherwise. Differentials include microscopic colitis or diarrhea predominant irritable bowel syndrome. Very less likely infectious diarrhea or celiac disease or inflammatory bowel disease  - recommend stool studies to evaluate for infections - Check serum TTG IgA - with history of chronic smoking, recommend to check fecal pancreatic elastase to rule out exocrine pancreatic insufficiency - recommend to decrease aspirin to 81 mg daily if not clinically indicated - Trial of Bentyl 10 mg 3 times a day before meals and at bedtime, decrease the dose if patient feels constipated -  discussed with her about indication for colonoscopy not only to evaluate for chronic diarrhea but also for colon cancer screening as she never had one before. Patient wants to wait until she turns 89 as she currently does not have insurance and cannot afford to undergo colonoscopy out of pocket. Therefore, I recommend annual fecal occult blood testing to be performed by primary care provider. I told patient that if the fecal occult blood testing comes back positive, then she will certainly need a colonoscopy and she is agreeable   Follow up in 3 months or sooner if needed   Cephas Darby, MD

## 2017-09-18 ENCOUNTER — Telehealth: Payer: Self-pay | Admitting: Gastroenterology

## 2017-09-18 ENCOUNTER — Encounter: Payer: Self-pay | Admitting: Family Medicine

## 2017-09-18 ENCOUNTER — Telehealth: Payer: Self-pay

## 2017-09-18 ENCOUNTER — Other Ambulatory Visit: Payer: Self-pay | Admitting: Gastroenterology

## 2017-09-18 NOTE — Telephone Encounter (Signed)
Patient contacted office to let us know she had labs done, however she was not able to have them all done (due to cost).  She had all labs done except GI Panel.  I told her we understood, and when we received her lab results I would give her a call back and thanked her for the notification.  Thanks [employer]

## 2017-09-18 NOTE — Telephone Encounter (Signed)
Patient called and had all labs done except for GI panel by pcr. It was too expensive. It was $780.  The other labs were $550. Patient doesn't have insurance.

## 2017-09-24 ENCOUNTER — Telehealth: Payer: Self-pay | Admitting: Gastroenterology

## 2017-09-24 NOTE — Telephone Encounter (Signed)
Pt left vm  For test results she would like a call please

## 2017-09-24 NOTE — Progress Notes (Signed)
Acknowledged. Thank you. Will you notify the patient of these results?

## 2017-09-25 ENCOUNTER — Telehealth: Payer: Self-pay

## 2017-09-25 LAB — C DIFFICILE TOXINS A+B W/RFLX: C DIFFICILE TOXINS A+B, EIA: NEGATIVE

## 2017-09-25 LAB — TISSUE TRANSGLUTAMINASE, IGA: Transglutaminase IgA: 9 U/mL — ABNORMAL HIGH (ref 0–3)

## 2017-09-25 LAB — IGA: IgA/Immunoglobulin A, Serum: 186 mg/dL (ref 87–352)

## 2017-09-25 LAB — C DIFFICILE, CYTOTOXIN B

## 2017-09-25 LAB — PANCREATIC ELASTASE, FECAL: Pancreatic Elastase, Fecal: >500 ug Elast./g (ref >200)

## 2017-09-25 NOTE — Telephone Encounter (Signed)
Patient has been informed of lab results and has an appt to come in to discuss results and additional concerns.  Thanks [employer]

## 2017-09-25 NOTE — Progress Notes (Signed)
Patient has been informed of results.  Patient has an appt to discuss results with Dr. Marius Ditch.

## 2017-10-03 ENCOUNTER — Other Ambulatory Visit: Payer: Self-pay | Admitting: Gastroenterology

## 2017-10-03 DIAGNOSIS — K529 Noninfective gastroenteritis and colitis, unspecified: Secondary | ICD-10-CM

## 2017-10-03 DIAGNOSIS — K580 Irritable bowel syndrome with diarrhea: Secondary | ICD-10-CM

## 2017-10-14 ENCOUNTER — Ambulatory Visit: Payer: Self-pay | Admitting: Gastroenterology

## 2017-10-14 ENCOUNTER — Encounter: Payer: Self-pay | Admitting: Gastroenterology

## 2017-10-14 VITALS — BP 112/74 | HR 73 | Temp 97.7°F | Ht 66.0 in | Wt 175.4 lb

## 2017-10-14 DIAGNOSIS — K900 Celiac disease: Secondary | ICD-10-CM

## 2017-10-14 DIAGNOSIS — K580 Irritable bowel syndrome with diarrhea: Secondary | ICD-10-CM

## 2017-10-14 MED ORDER — DICYCLOMINE HCL 10 MG PO CAPS: ORAL_CAPSULE | ORAL | 0 refills | Status: DC

## 2017-10-14 NOTE — Progress Notes (Signed)
Cephas Darby, MD [address]  [place]  [place], [place] [address]  Main: [phone]  Fax: [phone]    Gastroenterology Consultation  Referring Provider:     Margo Common, PA Primary Care Physician:  Margo Common, PA Primary Gastroenterologist:  Dr. Cephas Darby Reason for Consultation:     Probable celiac disease        HPI:   Katelyn Bartlett is a 62 y.o. Caucasian female referred by Dr. Margo Common, PA  for consultation & management of chronic diarrhea. She recalls having initial onset of symptoms in 1982, she was told that she had parasite at that time which was treated. Followed by that she had few years of alternating constipation and diarrhea with abdominal cramps and was told that she has irritable bowel syndrome. She underwent a laparoscopic cholecystectomy in 1989 due to symptomatic cholelithiasis. She was asymptomatic for 2 years and then her symptoms recurred. Over the last several years she has been experiencing frequent bowel movements,3-5 times daily associated with abdominal cramps She denies having constipation. She maintained a log of her bowel movements in last 1 week which she shared with me today. Stools are variable in consistency anywhere from loose to soft, semi-formed, nonbloody, sometimes associated with cramps, sometimes postprandial. She reports not losing weight all these years. She is a chronic tobacco smoker, smokes less than a pack a day. She denies nausea, vomiting, abdominal pain. She tried Imodium in the past. She found temporary benefit from probiotics. She has been taking aspirin 325 mg daily for last 1 year or so recommended by her primary care provider. She denies taking naproxen which was listed on her medications. She is waiting to turn 64 so that she gets Medicaid to undergo colonoscopy. She did not have any kind of stool studies performed. She has normal CBC, TSH, CMP, HIV nonreactive  Follow-up visit  10/14/2017 Patient reports that when taking is helping with her diarrhea. She takes up to 3 times a day. Workup of her diarrhea revealed that she has elevated celiac serologies, TTG IgA. Her stool studies were negative for infections. Pancreatic elastase came back normal. She decreased aspirin to 81 mg daily after discussing with her primary care doctor. She is learning more about celiac disease from Internet, reading blogs on gluten-free diet. She generally eats homemade food. 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 She denies weight loss, loss of appetite. She denies any other complaints today. She shared her financial constraints which are limiting her to undergo endoscopic evaluation at this time  NSAIDs: aspirin 81 mg daily  Antiplts/Anticoagulants/Anti thrombotics: none  GI Procedures: none She is adopted, family history unknown  Past Medical History:  Diagnosis Date  . Hypercholesteremia   . Tachycardia     Past Surgical History:  Procedure Laterality Date  . ABDOMINAL HYSTERECTOMY  1990  . CHOLECYSTECTOMY  1989  . ROTATOR CUFF REPAIR Right 2007  . SKIN CANCER EXCISION  2011   ABOVE LIP     Current Outpatient Medications:  .  amLODipine (NORVASC) 5 MG tablet, TAKE 1 TABLET BY MOUTH DAILY, Disp: 90 tablet, Rfl: 3 .  aspirin EC 81 MG tablet, Take 81 mg by mouth daily., Disp: , Rfl:  .  dicyclomine (BENTYL) 10 MG capsule, TAKE ONE CAPSULE BY MOUTH FOUR TIMES DAILY, BEFORE A MEAL AND AT BEDTIME, Disp: 90 capsule, Rfl: 0 .  metoprolol tartrate (LOPRESSOR) 25 MG tablet, TAKE 1  TABLET BY MOUTH TWICE DAILY, Disp: 180 tablet, Rfl: 3 .  MULTIPLE VITAMIN PO, Take 1 tablet by mouth daily., Disp: , Rfl:  .  OMEGA-3 FATTY ACIDS PO, Take 1 capsule by mouth daily., Disp: , Rfl:  .  pravastatin (PRAVACHOL) 40 MG tablet, TAKE 1 TABLET BY MOUTH DAILY, Disp: 90 tablet, Rfl: 3   Family History  Adopted: Yes  Family history unknown: Yes     Social History    Tobacco Use  . Smoking status: Current Every Day Smoker    Packs/day: 0.50    Years: 30.00    Pack years: 15.00    Types: Cigarettes  . Smokeless tobacco: Never Used  Substance Use Topics  . Alcohol use: No    Alcohol/week: 0.0 oz  . Drug use: No    Allergies as of 10/14/2017 - Review Complete 10/14/2017  Allergen Reaction Noted  . Morphine sulfate  01/16/2015    Review of Systems:    All systems reviewed and negative except where noted in HPI.   Physical Exam:  BP 112/74   Pulse 73   Temp 97.7 F (36.5 C) (Oral)   Ht 5' 6"  (1.676 m)   Wt 175 lb 6.4 oz (79.6 kg)   BMI 28.31 kg/m  No LMP recorded. Patient has had a hysterectomy.  General:   Alert,  Well-developed, well-nourished, pleasant and cooperative in NAD Head:  Normocephalic and atraumatic. Eyes:  Sclera clear, no icterus.   Conjunctiva pink. Ears:  Normal auditory acuity. Nose:  No deformity, discharge, or lesions. Mouth:  No deformity or lesions,oropharynx pink & moist. Neck:  Supple; no masses or thyromegaly. Lungs:  Respirations even and unlabored.  Clear throughout to auscultation.   No wheezes, crackles, or rhonchi. No acute distress. Heart:  Regular rate and rhythm; no murmurs, clicks, rubs, or gallops. Abdomen:  Normal bowel sounds. Soft, non-tender and non-distended without masses, hepatosplenomegaly or hernias noted.  No guarding or rebound tenderness.   Rectal: Not performed Msk:  Symmetrical without gross deformities. Good, equal movement & strength bilaterally. Pulses:  Normal pulses noted. Extremities:  No clubbing or edema.  No cyanosis. Neurologic:  Alert and oriented x3;  grossly normal neurologically. Skin:  Intact without significant lesions or rashes. No jaundice. Psych:  Alert and cooperative. Normal mood and affect.  Imaging Studies: reviewed  Assessment and Plan:   Katelyn Bartlett is a 62 y.o. Caucasian female with no significant past medical history, status post laparoscopic  cholecystectomy in 1989, presents with more than 20 years history of chronic nonbloody diarrhea with abdominal cramps. Workup revealed positive celiac serologies, she has probable celiac disease. Recommended upper endoscopy to establish a definitive diagnosis by taking duodenal biopsies and to assess the degree of severity of intestinal damage. However, patient would like to try the gluten-free diet and monitor her celiac serologies see if she is responding unless her symptoms get worse before undergoing upper endoscopy. This is because of her financial constraints to undergo endoscopic evaluation.  - discussed in length about various resources to educate on gluten-free diet, living with celiac disease - Continue Bentyl as needed - Patient wants to wait until she turns 32 as she currently does not have insurance and cannot afford to undergo colonoscopy out of pocket. Therefore, I recommend annual fecal occult blood testing to be performed by primary care provider. I told patient that if the fecal occult blood testing comes back positive, then she will certainly need a colonoscopy and she is agreeable  Follow up in 3-4 months or sooner if needed   Cephas Darby, MD

## 2017-10-21 ENCOUNTER — Encounter: Payer: Self-pay | Admitting: Family Medicine

## 2017-10-26 ENCOUNTER — Encounter: Payer: Self-pay | Admitting: Gastroenterology

## 2017-10-27 ENCOUNTER — Other Ambulatory Visit: Payer: Self-pay | Admitting: Gastroenterology

## 2017-10-27 DIAGNOSIS — K580 Irritable bowel syndrome with diarrhea: Secondary | ICD-10-CM

## 2017-10-27 MED ORDER — DICYCLOMINE HCL 10 MG PO CAPS: ORAL_CAPSULE | ORAL | 0 refills | Status: DC

## 2017-10-28 ENCOUNTER — Other Ambulatory Visit: Payer: Self-pay | Admitting: Gastroenterology

## 2017-10-28 DIAGNOSIS — K580 Irritable bowel syndrome with diarrhea: Secondary | ICD-10-CM

## 2017-10-28 MED ORDER — DICYCLOMINE HCL 10 MG PO CAPS: 10.0000 mg | ORAL_CAPSULE | Freq: Three times a day (TID) | ORAL | 0 refills | Status: DC

## 2018-01-09 ENCOUNTER — Other Ambulatory Visit: Payer: Self-pay | Admitting: Family Medicine

## 2018-01-12 ENCOUNTER — Other Ambulatory Visit: Payer: Self-pay | Admitting: Family Medicine

## 2018-02-20 ENCOUNTER — Encounter: Payer: Self-pay | Admitting: Gastroenterology

## 2018-02-20 ENCOUNTER — Ambulatory Visit: Payer: Self-pay | Admitting: Gastroenterology

## 2018-02-20 VITALS — BP 121/79 | HR 90 | Ht 66.0 in | Wt 167.8 lb

## 2018-02-20 DIAGNOSIS — K900 Celiac disease: Secondary | ICD-10-CM

## 2018-02-20 NOTE — Progress Notes (Signed)
Cephas Darby, MD [address]  [place]  [place], [place] [address]  Main: [phone]  Fax: [phone]    Gastroenterology Consultation  Referring Provider:     Margo Common, PA Primary Care Physician:  Margo Common, PA Primary Gastroenterologist:  Dr. Cephas Darby Reason for Consultation:     Probable celiac disease        HPI:   Katelyn Bartlett is a 62 y.o. Caucasian female referred by Dr. Margo Common, PA  for consultation & management of chronic diarrhea. She recalls having initial onset of symptoms in 1982, she was told that she had parasite at that time which was treated. Followed by that she had few years of alternating constipation and diarrhea with abdominal cramps and was told that she has irritable bowel syndrome. She underwent a laparoscopic cholecystectomy in 1989 due to symptomatic cholelithiasis. She was asymptomatic for 2 years and then her symptoms recurred. Over the last several years she has been experiencing frequent bowel movements,3-5 times daily associated with abdominal cramps She denies having constipation. She maintained a log of her bowel movements in last 1 week which she shared with me today. Stools are variable in consistency anywhere from loose to soft, semi-formed, nonbloody, sometimes associated with cramps, sometimes postprandial. She reports not losing weight all these years. She is a chronic tobacco smoker, smokes less than a pack a day. She denies nausea, vomiting, abdominal pain. She tried Imodium in the past. She found temporary benefit from probiotics. She has been taking aspirin 325 mg daily for last 1 year or so recommended by her primary care provider. She denies taking naproxen which was listed on her medications. She is waiting to turn 64 so that she gets Medicaid to undergo colonoscopy. She did not have any kind of stool studies performed. She has normal CBC, TSH, CMP, HIV nonreactive  Follow-up visit  10/14/2017 Patient reports that Bentyl is helping with her diarrhea. She takes up to 3 times a day. Workup of her diarrhea revealed that she has elevated celiac serologies, TTG IgA. Her stool studies were negative for infections. Pancreatic elastase came back normal. She decreased aspirin to 81 mg daily after discussing with her primary care doctor. She is learning more about celiac disease from Internet, reading blogs on gluten-free diet. She generally eats homemade food. 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 She denies weight loss, loss of appetite. She denies any other complaints today. She shared her financial constraints which are limiting her to undergo endoscopic evaluation at this time  Follow-up visit 02/20/2018 She is trying to follow a gluten-free diet. Continues to take Bentyl. She brought symptom diary with her today, she does have postprandial nonbloody diarrhea on most of the days. Her weight has been stable.  NSAIDs: aspirin 81 mg daily  Antiplts/Anticoagulants/Anti thrombotics: none  GI Procedures: none She is adopted, family history unknown  Past Medical History:  Diagnosis Date  . Hypercholesteremia   . Tachycardia     Past Surgical History:  Procedure Laterality Date  . ABDOMINAL HYSTERECTOMY  1990  . CHOLECYSTECTOMY  1989  . ROTATOR CUFF REPAIR Right 2007  . SKIN CANCER EXCISION  2011   ABOVE LIP     Current Outpatient Medications:  .  amLODipine (NORVASC) 5 MG tablet, TAKE 1 TABLET BY MOUTH DAILY, Disp: 90 tablet, Rfl: 3 .  aspirin EC 81 MG tablet, Take 81 mg by mouth daily., Disp: ,  Rfl:  .  metoprolol tartrate (LOPRESSOR) 25 MG tablet, TAKE 1 TABLET BY MOUTH TWICE DAILY, Disp: 180 tablet, Rfl: 3 .  MULTIPLE VITAMIN PO, Take 1 tablet by mouth daily., Disp: , Rfl:  .  OMEGA-3 FATTY ACIDS PO, Take 1 capsule by mouth daily., Disp: , Rfl:  .  pravastatin (PRAVACHOL) 40 MG tablet, TAKE 1 TABLET BY MOUTH DAILY, Disp: 90 tablet,  Rfl: 3 .  dicyclomine (BENTYL) 10 MG capsule, Take 1 capsule (10 mg total) by mouth 4 (four) times daily -  before meals and at bedtime., Disp: 360 capsule, Rfl: 0   Family History  Adopted: Yes  Family history unknown: Yes     Social History   Tobacco Use  . Smoking status: Current Every Day Smoker    Packs/day: 0.50    Years: 30.00    Pack years: 15.00    Types: Cigarettes  . Smokeless tobacco: Never Used  Substance Use Topics  . Alcohol use: No    Alcohol/week: 0.0 oz  . Drug use: No    Allergies as of 02/20/2018 - Review Complete 02/20/2018  Allergen Reaction Noted  . Morphine sulfate  01/16/2015    Review of Systems:    All systems reviewed and negative except where noted in HPI.   Physical Exam:  BP 121/79   Pulse 90   Ht 5' 6"  (1.676 m)   Wt 167 lb 12.8 oz (76.1 kg)   BMI 27.08 kg/m  No LMP recorded. Patient has had a hysterectomy.  General:   Alert,  Well-developed, well-nourished, pleasant and cooperative in NAD Head:  Normocephalic and atraumatic. Eyes:  Sclera clear, no icterus.   Conjunctiva pink. Ears:  Normal auditory acuity. Nose:  No deformity, discharge, or lesions. Mouth:  No deformity or lesions,oropharynx pink & moist. Neck:  Supple; no masses or thyromegaly. Lungs:  Respirations even and unlabored.  Clear throughout to auscultation.   No wheezes, crackles, or rhonchi. No acute distress. Heart:  Regular rate and rhythm; no murmurs, clicks, rubs, or gallops. Abdomen:  Normal bowel sounds. Soft, non-tender and non-distended without masses, hepatosplenomegaly or hernias noted.  No guarding or rebound tenderness.   Rectal: Not performed Msk:  Symmetrical without gross deformities. Good, equal movement & strength bilaterally. Pulses:  Normal pulses noted. Extremities:  No clubbing or edema.  No cyanosis. Neurologic:  Alert and oriented x3;  grossly normal neurologically. Skin:  Intact without significant lesions or rashes. No jaundice. Psych:   Alert and cooperative. Normal mood and affect.  Imaging Studies: reviewed  Assessment and Plan:   Katelyn Bartlett is a 62 y.o. Caucasian female with no significant past medical history, status post laparoscopic cholecystectomy in 1989, presents with more than 20 years history of chronic nonbloody diarrhea with abdominal cramps. Workup revealed positive celiac serologies, she has probable celiac disease. Recommended upper endoscopy to establish a definitive diagnosis by taking duodenal biopsies and to assess the degree of severity of intestinal damage. However, patient would like to continue the gluten-free diet and monitor her celiac serologies see if she is responding unless her symptoms get worse before undergoing upper endoscopy. This is because of her financial constraints to undergo endoscopic evaluation.  - check TTG IgA - Continue Bentyl as needed - Patient wants to wait until she turns 64 as she currently does not have insurance and cannot afford to undergo colonoscopy out of pocket. Therefore, I recommend annual fecal occult blood testing to be performed by primary care provider. I told  patient that if the fecal occult blood testing comes back positive, then she will certainly need a colonoscopy and she is agreeable   Follow up in 6 months or sooner if needed   Cephas Darby, MD

## 2018-03-02 ENCOUNTER — Ambulatory Visit: Payer: Self-pay | Admitting: Family Medicine

## 2018-03-09 ENCOUNTER — Ambulatory Visit: Payer: Self-pay | Admitting: Family Medicine

## 2018-03-14 ENCOUNTER — Other Ambulatory Visit: Payer: Self-pay | Admitting: Gastroenterology

## 2018-03-14 DIAGNOSIS — K580 Irritable bowel syndrome with diarrhea: Secondary | ICD-10-CM

## 2018-03-31 ENCOUNTER — Encounter: Payer: Self-pay | Admitting: Family Medicine

## 2018-03-31 ENCOUNTER — Ambulatory Visit: Payer: Self-pay | Admitting: Family Medicine

## 2018-03-31 VITALS — BP 106/70 | HR 64 | Temp 98.3°F | Wt 165.0 lb

## 2018-03-31 DIAGNOSIS — I10 Essential (primary) hypertension: Secondary | ICD-10-CM

## 2018-03-31 DIAGNOSIS — E782 Mixed hyperlipidemia: Secondary | ICD-10-CM

## 2018-03-31 DIAGNOSIS — K900 Celiac disease: Secondary | ICD-10-CM

## 2018-03-31 DIAGNOSIS — K5790 Diverticulosis of intestine, part unspecified, without perforation or abscess without bleeding: Secondary | ICD-10-CM

## 2018-03-31 NOTE — Patient Instructions (Signed)
Diverticulosis Diverticulosis is a condition that develops when small pouches (diverticula) form in the wall of the large intestine (colon). The colon is where water is absorbed and stool is formed. The pouches form when the inside layer of the colon pushes through weak spots in the outer layers of the colon. You may have a few pouches or many of them. What are the causes? The cause of this condition is not known. What increases the risk? The following factors may make you more likely to develop this condition:  Being older than age 73. Your risk for this condition increases with age. Diverticulosis is rare among people younger than age 21. By age 52, many people have it.  Eating a low-fiber diet.  Having frequent constipation.  Being overweight.  Not getting enough exercise.  Smoking.  Taking over-the-counter pain medicines, like aspirin and ibuprofen.  Having a family history of diverticulosis.  What are the signs or symptoms? In most people, there are no symptoms of this condition. If you do have symptoms, they may include:  Bloating.  Cramps in the abdomen.  Constipation or diarrhea.  Pain in the lower left side of the abdomen.  How is this diagnosed? This condition is most often diagnosed during an exam for other colon problems. Because diverticulosis usually has no symptoms, it often cannot be diagnosed independently. This condition may be diagnosed by:  Using a flexible scope to examine the colon (colonoscopy).  Taking an X-ray of the colon after dye has been put into the colon (barium enema).  Doing a CT scan.  How is this treated? You may not need treatment for this condition if you have never developed an infection related to diverticulosis. If you have had an infection before, treatment may include:  Eating a high-fiber diet. This may include eating more fruits, vegetables, and grains.  Taking a fiber supplement.  Taking a live bacteria supplement  (probiotic).  Taking medicine to relax your colon.  Taking antibiotic medicines.  Follow these instructions at home:  Drink 6-8 glasses of water or more each day to prevent constipation.  Try not to strain when you have a bowel movement.  If you have had an infection before: ? Eat more fiber as directed by your health care provider or your diet and nutrition specialist (dietitian). ? Take a fiber supplement or probiotic, if your health care provider approves.  Take over-the-counter and prescription medicines only as told by your health care provider.  If you were prescribed an antibiotic, take it as told by your health care provider. Do not stop taking the antibiotic even if you start to feel better.  Keep all follow-up visits as told by your health care provider. This is important. Contact a health care provider if:  You have pain in your abdomen.  You have bloating.  You have cramps.  You have not had a bowel movement in 3 days. Get help right away if:  Your pain gets worse.  Your bloating becomes very bad.  You have a fever or chills, and your symptoms suddenly get worse.  You vomit.  You have bowel movements that are bloody or black.  You have bleeding from your rectum. Summary  Diverticulosis is a condition that develops when small pouches (diverticula) form in the wall of the large intestine (colon).  You may have a few pouches or many of them.  This condition is most often diagnosed during an exam for other colon problems.  If you have had an  infection related to diverticulosis, treatment may include increasing the fiber in your diet, taking supplements, or taking medicines. This information is not intended to replace advice given to you by your health care provider. Make sure you discuss any questions you have with your health care provider. Document Released: 04/04/2004 Document Revised: 05/27/2016 Document Reviewed: 05/27/2016 Elsevier Interactive  Patient Education  2017 [employer].

## 2018-03-31 NOTE — Progress Notes (Signed)
Patient: Katelyn Bartlett Female    DOB: 05-May-1956   62 y.o.   MRN: [id number] Visit Date: 03/31/2018  Today's Provider: Vernie Murders, PA   Chief Complaint  Patient presents with  . Hypertension  . Hyperlipidemia   Subjective:    Hypertension  This is a chronic problem. The problem is unchanged. The problem is controlled. Pertinent negatives include no anxiety, blurred vision, chest pain, headaches, malaise/fatigue, neck pain, orthopnea, palpitations, peripheral edema, PND, shortness of breath or sweats. There is no history of chronic renal disease.  Hyperlipidemia  This is a chronic problem. She has no history of chronic renal disease, hypothyroidism, liver disease, obesity or nephrotic syndrome. Pertinent negatives include no chest pain or shortness of breath. Current antihyperlipidemic treatment includes statins. There are no compliance problems.     Lab Results  Component Value Date   CHOL 154 08/29/2017   CHOL 173 08/27/2016   CHOL 139 02/13/2016   Lab Results  Component Value Date   HDL 37 (L) 08/29/2017   HDL 41 08/27/2016   HDL 32 (L) 02/13/2016   Lab Results  Component Value Date   LDLCALC 88 08/29/2017   LDLCALC 102 (H) 08/27/2016   LDLCALC 82 02/13/2016   Lab Results  Component Value Date   TRIG 147 08/29/2017   TRIG 150 (H) 08/27/2016   TRIG 125 02/13/2016   Lab Results  Component Value Date   CHOLHDL 4.2 08/29/2017   CHOLHDL 4.2 08/27/2016   CHOLHDL 4.3 02/13/2016   No results found for: LDLDIRECT BP Readings from Last 3 Encounters:  03/31/18 106/70  02/20/18 121/79  10/14/17 112/74   Wt Readings from Last 3 Encounters:  03/31/18 165 lb (74.8 kg)  02/20/18 167 lb 12.8 oz (76.1 kg)  10/14/17 175 lb 6.4 oz (79.6 kg)      Past Surgical History:  Procedure Laterality Date  . ABDOMINAL HYSTERECTOMY  1990  . CHOLECYSTECTOMY  1989  . ROTATOR CUFF REPAIR Right 2007  . SKIN CANCER EXCISION  2011   ABOVE LIP   Family History    Adopted: Yes  Family history unknown: Yes   Allergies  Allergen Reactions  . Morphine Sulfate     BP drops    Current Outpatient Medications:  .  amLODipine (NORVASC) 5 MG tablet, TAKE 1 TABLET BY MOUTH DAILY, Disp: 90 tablet, Rfl: 3 .  aspirin EC 81 MG tablet, Take 81 mg by mouth daily., Disp: , Rfl:  .  dicyclomine (BENTYL) 10 MG capsule, TAKE 1 CAPSULE(10 MG) BY MOUTH FOUR TIMES DAILY BEFORE MEALS AND AT BEDTIME, Disp: 360 capsule, Rfl: 0 .  metoprolol tartrate (LOPRESSOR) 25 MG tablet, TAKE 1 TABLET BY MOUTH TWICE DAILY, Disp: 180 tablet, Rfl: 3 .  MULTIPLE VITAMIN PO, Take 1 tablet by mouth daily., Disp: , Rfl:  .  OMEGA-3 FATTY ACIDS PO, Take 1 capsule by mouth daily., Disp: , Rfl:  .  pravastatin (PRAVACHOL) 40 MG tablet, TAKE 1 TABLET BY MOUTH DAILY, Disp: 90 tablet, Rfl: 3  Review of Systems  Constitutional: Negative.  Negative for malaise/fatigue.  Eyes: Negative for blurred vision.  Respiratory: Negative.  Negative for shortness of breath.   Cardiovascular: Negative.  Negative for chest pain, palpitations, orthopnea and PND.  Gastrointestinal: Positive for diarrhea. Negative for abdominal distention, abdominal pain, anal bleeding, blood in stool, constipation, nausea, rectal pain and vomiting.  Musculoskeletal: Negative.  Negative for neck pain.  Neurological: Negative for dizziness, light-headedness and headaches.  Social History   Tobacco Use  . Smoking status: Current Every Day Smoker    Packs/day: 0.50    Years: 30.00    Pack years: 15.00    Types: Cigarettes  . Smokeless tobacco: Never Used  Substance Use Topics  . Alcohol use: No    Alcohol/week: 0.0 standard drinks   Objective:   BP 106/70 (BP Location: Right Arm, Patient Position: Sitting, Cuff Size: Normal)   Pulse 64   Temp 98.3 F (36.8 C) (Oral)   Wt 165 lb (74.8 kg)   SpO2 97%   BMI 26.63 kg/m  Vitals:   03/31/18 0837  BP: 106/70  Pulse: 64  Temp: 98.3 F (36.8 C)  TempSrc: Oral   SpO2: 97%  Weight: 165 lb (74.8 kg)   Physical Exam  Constitutional: She is oriented to person, place, and time. She appears well-developed and well-nourished. No distress.  HENT:  Head: Normocephalic and atraumatic.  Right Ear: Hearing normal.  Left Ear: Hearing normal.  Nose: Nose normal.  Eyes: Conjunctivae and lids are normal. Right eye exhibits no discharge. Left eye exhibits no discharge. No scleral icterus.  Neck: Neck supple.  Cardiovascular: Normal rate and regular rhythm.  Pulmonary/Chest: Effort normal. No respiratory distress.  Abdominal: Soft. Bowel sounds are normal.  Musculoskeletal: Normal range of motion.  Neurological: She is alert and oriented to person, place, and time.  Skin: Skin is intact. No lesion and no rash noted.  Psychiatric: She has a normal mood and affect. Her speech is normal and behavior is normal. Thought content normal.      Assessment & Plan:     1. Benign hypertension Well controlled BP on the Amlodipine 5 mg qd and Metoprolol Tartrate 25 mg BID. No chest pains, edema, dyspnea or palpitations. Feeling well and labs on 08-29-17 was all normal. Unable to afford labs today since she does not have insurance. Recheck in 4-6 months.  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 Continue present regimen and recheck in 6 months.  3. Celiac disease Had elevated celiac serologies per Dr. Marius Ditch (gastroenterologist) in March 2019. Has continued to have some chronic diarrhea (less than in the past) with use of the glutein-free diet and use of Bentyl. Dr. Keith Rake she get Vitamin-D level and tTG IgA test. Denies blood in stools or fever. Some cramps with diarrhea stools. Follow up with gastroenterologist as planned. - Glia (IgA/G) + tTG IgA - VITAMIN D 25 Hydroxy (Vit-D Deficiency, Fractures)  4. Diverticulosis Documented in the descending colon on a CT scan for kidney  stones in 2014. No pain today. Recommend she ask Dr. Marius Ditch to review this CT scan since she can't afford colonoscopy without insurance. - Glia (IgA/G) + tTG IgA - VITAMIN D 25 Hydroxy (Vit-D Deficiency, Fractures)       Vernie Murders, PA  [facility]

## 2018-04-01 ENCOUNTER — Encounter: Payer: Self-pay | Admitting: Family Medicine

## 2018-04-01 ENCOUNTER — Encounter: Payer: Self-pay | Admitting: Gastroenterology

## 2018-04-01 DIAGNOSIS — K900 Celiac disease: Secondary | ICD-10-CM

## 2018-04-02 ENCOUNTER — Other Ambulatory Visit: Payer: Self-pay | Admitting: Family Medicine

## 2018-04-02 DIAGNOSIS — K900 Celiac disease: Secondary | ICD-10-CM

## 2018-04-02 NOTE — Telephone Encounter (Signed)
Spoke with pt; she is going to come by today and pick up a new lab order.   Thanks,   -Mickel Baas

## 2018-04-02 NOTE — Telephone Encounter (Signed)
Ask lab to change test to this number.

## 2018-04-05 ENCOUNTER — Encounter (INDEPENDENT_AMBULATORY_CARE_PROVIDER_SITE_OTHER): Payer: Self-pay | Admitting: Family Medicine

## 2018-04-05 DIAGNOSIS — Z1211 Encounter for screening for malignant neoplasm of colon: Secondary | ICD-10-CM

## 2018-04-05 DIAGNOSIS — K900 Celiac disease: Secondary | ICD-10-CM

## 2018-04-05 LAB — TISSUE TRANSGLUTAMINASE, IGA: Transglutaminase IgA: 2 U/mL (ref 0–3)

## 2018-04-06 ENCOUNTER — Telehealth: Payer: Self-pay

## 2018-04-06 NOTE — Telephone Encounter (Signed)
-----  Message from Margo Common, [place] sent at 04/05/2018  6:52 PM EDT ----- Celiac serology test negative now. Indicates diet and treatment plan is working to control diarrhea. Dr. Verlin Grills note from 02-20-18 suggested testing stool for blood. If positive, to proceed to colonoscopy sooner. Come by the office to pick up an OC-Light kit to test stools at home. If test negative for blood, will continue present regimen and follow up with Dr. Marius Ditch in Feb. 2020.

## 2018-04-06 NOTE — Telephone Encounter (Signed)
Patient advised as below. Patient request that we send lab results to dr. Marius Ditch.  Patient reports that she has done stool test, done in April with Dr. Marius Ditch.

## 2018-04-07 ENCOUNTER — Encounter: Payer: Self-pay | Admitting: Gastroenterology

## 2018-04-07 NOTE — Telephone Encounter (Signed)
Dr. Verlin Grills note on 02-20-18 recommended checking stools for blood again. This is not a culture/comprehensive stool test like the one in February.

## 2018-04-07 NOTE — Telephone Encounter (Signed)
lmtcb

## 2018-04-08 NOTE — Telephone Encounter (Signed)
Patient has sent a message to Dr. Marius Ditch on [place]. Patient reports she will wait on Dr. Marius Ditch and if she does need test she will come and pick up OC light kit.

## 2018-04-13 LAB — IFOBT (OCCULT BLOOD): IFOBT: NEGATIVE

## 2018-04-13 NOTE — Telephone Encounter (Signed)
Good news. No blood in stools. Continue present diet and treatment plan Follow up with Dr. Marius Ditch as planned in Feb. 2020. Call or return here as needed.

## 2018-04-15 ENCOUNTER — Telehealth: Payer: Self-pay

## 2018-04-15 NOTE — Telephone Encounter (Signed)
-----   Message from Margo Common, [place] sent at 04/13/2018 10:28 AM EDT ----- Good news. No blood in stools. Continue present diet and treatment plan Follow up with Dr. Marius Ditch as planned in Feb. 2020. Call or return here as needed.

## 2018-04-15 NOTE — Telephone Encounter (Signed)
Pt advised.   Thanks,   -Laura  

## 2018-06-15 ENCOUNTER — Other Ambulatory Visit: Payer: Self-pay | Admitting: Gastroenterology

## 2018-06-15 DIAGNOSIS — K580 Irritable bowel syndrome with diarrhea: Secondary | ICD-10-CM

## 2018-09-15 LAB — HM MAMMOGRAPHY

## 2018-09-17 ENCOUNTER — Encounter: Payer: Self-pay | Admitting: *Deleted

## 2018-10-13 ENCOUNTER — Encounter: Payer: Self-pay | Admitting: Family Medicine

## 2018-10-14 ENCOUNTER — Telehealth: Payer: Self-pay

## 2018-10-14 NOTE — Telephone Encounter (Signed)
Patient is returning a call to [place]. She is requesting a call back regarding follow up appointment and labs. 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 She requested if labs are absolutely necessary that it be the bare minimum due to being self -pay. Patient states she will reschedule follow up appointment when she receives a call back.  CB# [phone].

## 2018-10-15 ENCOUNTER — Other Ambulatory Visit: Payer: Self-pay | Admitting: Family Medicine

## 2018-10-15 ENCOUNTER — Ambulatory Visit: Payer: Self-pay | Admitting: Family Medicine

## 2018-10-15 DIAGNOSIS — K580 Irritable bowel syndrome with diarrhea: Secondary | ICD-10-CM

## 2018-10-15 MED ORDER — DICYCLOMINE HCL 10 MG PO CAPS: ORAL_CAPSULE | ORAL | 0 refills | Status: DC

## 2018-10-15 NOTE — Telephone Encounter (Signed)
Patient was advised and scheduled a follow up ov for 12/28/2018.

## 2018-10-15 NOTE — Telephone Encounter (Signed)
Sent the Bentyl 10 mg BID to [facility] at [facility]. If able to check BP at home, it would help [place] monitor hypertension control. Getting blood tests to monitor kidney function and electrolytes once every 6 months, helps be sure no damage or side effects from medications or damage to function from hypertension. We should try to get this done in the next 3 months since our last check was Feb.2019.

## 2018-10-15 NOTE — Telephone Encounter (Signed)
We canceled patient's follow up appt and advised her she is due for labs. Patient is self pay and is due for labs. Patient asked if we order labs for her, that they only be necessary for now or if she can hold off getting them done for a while longer. Also patient wanted to know if Simona Huh would be willing to fill her Bentyl rx, because she is no longer seeing gastro? Please advise?

## 2018-12-28 ENCOUNTER — Ambulatory Visit: Payer: Self-pay | Admitting: Family Medicine

## 2018-12-28 ENCOUNTER — Other Ambulatory Visit: Payer: Self-pay

## 2018-12-28 ENCOUNTER — Encounter: Payer: Self-pay | Admitting: Family Medicine

## 2018-12-28 VITALS — BP 118/72 | HR 82 | Temp 98.6°F | Wt 176.0 lb

## 2018-12-28 DIAGNOSIS — I10 Essential (primary) hypertension: Secondary | ICD-10-CM

## 2018-12-28 DIAGNOSIS — K900 Celiac disease: Secondary | ICD-10-CM

## 2018-12-28 DIAGNOSIS — D171 Benign lipomatous neoplasm of skin and subcutaneous tissue of trunk: Secondary | ICD-10-CM

## 2018-12-28 DIAGNOSIS — E782 Mixed hyperlipidemia: Secondary | ICD-10-CM

## 2018-12-28 DIAGNOSIS — I471 Supraventricular tachycardia: Secondary | ICD-10-CM

## 2018-12-28 NOTE — Progress Notes (Signed)
Katelyn Bartlett  MRN: [id number] DOB: 1956/06/18  Subjective:  HPI   The patient is a 63 year old female who presents for what was supposed to be a 6 month follow up in February.  Due to COVID 19 her appointment had to be put off until today.   She reports that the only thing she is here for is her cholesterol.  When asked about other chronic health she stated that she does not have any hypertension.  She reports that the reason she is on Amlodipine was because her Rheumatologist put her on it because of a circulatory problem from her Metoprolol.    Patient Active Problem List   Diagnosis Date Noted  . Celiac disease 10/14/2017  . Supraventricular tachycardia ([place]) 04/21/2015  . Arthropathia 01/16/2015  . Alopecia 01/16/2015  . 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 Benign hypertension 01/16/2015  . Abnormal results of thyroid function studies 01/16/2015  . Raynaud's syndrome without gangrene 01/16/2015  . Fatigue 01/16/2015    Past Medical History:  Diagnosis Date  . Abnormal weight gain 01/16/2015  . Hypercholesteremia   . Tachycardia    Past Surgical History:  Procedure Laterality Date  . ABDOMINAL HYSTERECTOMY  1990  . CHOLECYSTECTOMY  1989  . ROTATOR CUFF REPAIR Right 2007  . SKIN CANCER EXCISION  2011   ABOVE LIP   Family History  Adopted: Yes  Family history unknown: Yes  ' Social History   Socioeconomic History  . Marital status: Widowed    Spouse name: Not on file  . Number of children: Not on file  . Years of education: Not on file  . Highest education level: Not on file  Occupational History  . Not on file  Social Needs  . Financial resource strain: Not on file  . Food insecurity:    Worry: Not on file    Inability: Not on file  . Transportation needs:    Medical: Not on file    Non-medical: Not on file  Tobacco Use  . Smoking status: Current Every Day Smoker    Packs/day: 0.50    Years: 30.00    Pack years: 15.00     Types: Cigarettes  . Smokeless tobacco: Never Used  Substance and Sexual Activity  . Alcohol use: No    Alcohol/week: 0.0 standard drinks  . Drug use: No  . Sexual activity: Not on file  Lifestyle  . Physical activity:    Days per week: Not on file    Minutes per session: Not on file  . Stress: Not on file  Relationships  . Social connections:    Talks on phone: Not on file    Gets together: Not on file    Attends religious service: Not on file    Active member of club or organization: Not on file    Attends meetings of clubs or organizations: Not on file    Relationship status: Not on file  . Intimate partner violence:    Fear of current or ex partner: Not on file    Emotionally abused: Not on file    Physically abused: Not on file    Forced sexual activity: Not on file  Other Topics Concern  . Not on file  Social History Narrative  . Not on file    Outpatient Encounter Medications as of 12/28/2018  Medication Sig  . amLODipine (NORVASC) 5 MG tablet TAKE 1 TABLET BY MOUTH DAILY  . aspirin EC 81 MG  tablet Take 81 mg by mouth daily.  Marland Kitchen dicyclomine (BENTYL) 10 MG capsule TAKE 1 CAPSULE(10 MG) BY MOUTH TWO TIMES DAILY AS NEEDED.  . metoprolol tartrate (LOPRESSOR) 25 MG tablet TAKE 1 TABLET BY MOUTH TWICE DAILY  . MULTIPLE VITAMIN PO Take 1 tablet by mouth daily.  . OMEGA-3 FATTY ACIDS PO Take 1 capsule by mouth daily.  . pravastatin (PRAVACHOL) 40 MG tablet TAKE 1 TABLET BY MOUTH DAILY   No facility-administered encounter medications on file as of 12/28/2018.     Allergies  Allergen Reactions  . Morphine Sulfate     BP drops    Review of Systems  Constitutional: Negative for fever.  HENT: Negative for congestion, ear pain, sinus pain and sore throat.   Respiratory: Negative for cough, shortness of breath and wheezing.   Cardiovascular: Negative for chest pain, palpitations and leg swelling.    Objective:  BP 118/72 (BP Location: Right Arm, Patient Position:  Sitting, Cuff Size: Normal)   Pulse 82   Temp 98.6 F (37 C) (Oral)   Wt 176 lb (79.8 kg)   SpO2 96%   BMI 28.41 kg/m  Wt Readings from Last 3 Encounters:  12/28/18 176 lb (79.8 kg)  03/31/18 165 lb (74.8 kg)  02/20/18 167 lb 12.8 oz (76.1 kg)   BP Readings from Last 3 Encounters:  12/28/18 118/72  03/31/18 106/70  02/20/18 121/79   Physical Exam  Constitutional: She is oriented to person, place, and time and well-developed, well-nourished, and in no distress.  HENT:  Head: Normocephalic.  Eyes: Conjunctivae are normal.  Neck: Neck supple.  Cardiovascular: Normal rate.  Pulmonary/Chest: Effort normal.  Abdominal: Soft. Bowel sounds are normal.  Lipoma in epigastrium.  Musculoskeletal: Normal range of motion.  Neurological: She is alert and oriented to person, place, and time.  Skin: No rash noted.  Psychiatric: Mood, affect and judgment normal.    Assessment and Plan :  1. Benign hypertension Well controlled BP. No chest pains, palpitations, dyspnea or palpitations. Continue Metoprolol and Amlodipine. Recheck routine labs and follow up pending reports. Also, has a history of Raynaud's Phenomena that has not recurred in her toes since starting the Amlodipine. - CBC with Differential/Platelet - Comprehensive metabolic panel - TSH  2. Combined fat and carbohydrate induced hyperlipemia Tolerating Pravastatin 40 mg qd with Omega-3 Fatty Acids qd. Trying to follow low fat diet but pandemic restrictions has allowed her to eat more and gain 10 lbs. Need to exercise regularly and recheck labs. - Comprehensive metabolic panel - Lipid panel - TSH  3. Celiac disease Trying to follow gluten-free diet and uses Bentyl 10 mg BID prn. Recheck CBC and CMP. May have a component of IBS-D. When financially able, she will follow up with gastroenterologist (Dr. Marius Ditch). - CBC with Differential/Platelet - Comprehensive metabolic panel  4. Supraventricular tachycardia ([place]) Well controlled  without palpitations or chest pains. Continues to tolerate the Metoprolol Tartrate 25 mg BID without side effects. Recheck CBC and TSH. - CBC with Differential/Platelet - TSH  5. Lipoma of torso 2.5 cm soft lipoma in the upper abdomen/epigastric area. No tenderness or sign of ventral hernia.

## 2018-12-29 ENCOUNTER — Telehealth: Payer: Self-pay

## 2018-12-29 LAB — CBC WITH DIFFERENTIAL/PLATELET
Basophils Absolute: 0.1 10*3/uL (ref 0.0–0.2)
Basos: 1 %
EOS (ABSOLUTE): 0.1 10*3/uL (ref 0.0–0.4)
Eos: 1 %
Hematocrit: 46.2 % (ref 34.0–46.6)
Hemoglobin: 15.9 g/dL (ref 11.1–15.9)
Immature Grans (Abs): 0 10*3/uL (ref 0.0–0.1)
Immature Granulocytes: 0 %
Lymphocytes Absolute: 1.8 10*3/uL (ref 0.7–3.1)
Lymphs: 25 %
MCH: 31.6 pg (ref 26.6–33.0)
MCHC: 34.4 g/dL (ref 31.5–35.7)
MCV: 92 fL (ref 79–97)
Monocytes Absolute: 0.7 10*3/uL (ref 0.1–0.9)
Monocytes: 10 %
Neutrophils Absolute: 4.6 10*3/uL (ref 1.4–7.0)
Neutrophils: 63 %
Platelets: 227 10*3/uL (ref 150–450)
RBC: 5.03 x10E6/uL (ref 3.77–5.28)
RDW: 13.3 % (ref 11.7–15.4)
WBC: 7.3 10*3/uL (ref 3.4–10.8)

## 2018-12-29 LAB — COMPREHENSIVE METABOLIC PANEL
ALT: 50 IU/L — ABNORMAL HIGH (ref 0–32)
AST: 30 IU/L (ref 0–40)
Albumin/Globulin Ratio: 1.8 (ref 1.2–2.2)
Albumin: 4.4 g/dL (ref 3.8–4.8)
Alkaline Phosphatase: 70 IU/L (ref 39–117)
BUN/Creatinine Ratio: 22 (ref 12–28)
BUN: 16 mg/dL (ref 8–27)
Bilirubin Total: 0.4 mg/dL (ref 0.0–1.2)
CO2: 21 mmol/L (ref 20–29)
Calcium: 9.5 mg/dL (ref 8.7–10.3)
Chloride: 105 mmol/L (ref 96–106)
Creatinine, Ser: 0.74 mg/dL (ref 0.57–1.00)
GFR calc Af Amer: 99 mL/min/{1.73_m2} (ref >59)
GFR calc non Af Amer: 86 mL/min/{1.73_m2} (ref >59)
Globulin, Total: 2.4 g/dL (ref 1.5–4.5)
Glucose: 99 mg/dL (ref 65–99)
Potassium: 4.1 mmol/L (ref 3.5–5.2)
Sodium: 142 mmol/L (ref 134–144)
Total Protein: 6.8 g/dL (ref 6.0–8.5)

## 2018-12-29 LAB — LIPID PANEL
Chol/HDL Ratio: 5.1 ratio — ABNORMAL HIGH (ref 0.0–4.4)
Cholesterol, Total: 182 mg/dL (ref 100–199)
HDL: 36 mg/dL — ABNORMAL LOW (ref >39)
LDL Calculated: 99 mg/dL (ref 0–99)
Triglycerides: 234 mg/dL — ABNORMAL HIGH (ref 0–149)
VLDL Cholesterol Cal: 47 mg/dL — ABNORMAL HIGH (ref 5–40)

## 2018-12-29 LAB — TSH: TSH: 3.4 u[IU]/mL (ref 0.450–4.500)

## 2018-12-29 NOTE — Telephone Encounter (Signed)
Patient advised as directed below. 

## 2018-12-29 NOTE — Telephone Encounter (Signed)
-----   Message from Margo Common, [place] sent at 12/29/2018  8:24 AM EDT ----- All tests normal except triglycerides high and HDL low. Ratio elevated to more than the average risk for CV events. Suggest adding Flush-Free Niacin 250 mg qd to help control lipids with low fat diet and regular exercise 3-4 days a week. Continue Pravastatin 40 mg qd. Recheck progress in 3-4 months.

## 2019-01-06 ENCOUNTER — Other Ambulatory Visit: Payer: Self-pay | Admitting: Family Medicine

## 2019-01-06 DIAGNOSIS — K580 Irritable bowel syndrome with diarrhea: Secondary | ICD-10-CM

## 2019-02-28 ENCOUNTER — Encounter: Payer: Self-pay | Admitting: Family Medicine

## 2019-04-07 ENCOUNTER — Other Ambulatory Visit: Payer: Self-pay | Admitting: Family Medicine

## 2019-04-07 DIAGNOSIS — K580 Irritable bowel syndrome with diarrhea: Secondary | ICD-10-CM

## 2019-07-01 ENCOUNTER — Other Ambulatory Visit: Payer: Self-pay

## 2019-07-01 ENCOUNTER — Ambulatory Visit (INDEPENDENT_AMBULATORY_CARE_PROVIDER_SITE_OTHER): Payer: Self-pay | Admitting: Family Medicine

## 2019-07-01 VITALS — BP 120/80 | HR 66 | Temp 97.1°F | Wt 176.0 lb

## 2019-07-01 DIAGNOSIS — I10 Essential (primary) hypertension: Secondary | ICD-10-CM

## 2019-07-01 DIAGNOSIS — I471 Supraventricular tachycardia: Secondary | ICD-10-CM

## 2019-07-01 DIAGNOSIS — M19041 Primary osteoarthritis, right hand: Secondary | ICD-10-CM

## 2019-07-01 DIAGNOSIS — M19042 Primary osteoarthritis, left hand: Secondary | ICD-10-CM

## 2019-07-01 DIAGNOSIS — K900 Celiac disease: Secondary | ICD-10-CM

## 2019-07-01 DIAGNOSIS — E782 Mixed hyperlipidemia: Secondary | ICD-10-CM

## 2019-07-01 NOTE — Progress Notes (Signed)
Katelyn Bartlett  MRN: [id number] DOB: 02-18-1956  Subjective:  HPI   Hypertension- The patient is a 63 year old female who presents for follow up of hypertension.  Her last visit was on 12/28/18 and her blood pressure at that time was stable.    BP Readings from Last 3 Encounters:  07/01/19 120/80  12/28/18 118/72  03/31/18 106/70   The patient states that she really doesn't take the Norvasc for hypertension but to offset the side effect of symptoms similar to Raynaulds syndrome from the Metoprolol.  Hyperlipidemia-patient had lipids checked on her last visit.  She was advised to add Niacin to her medication regiment.  Lab Results  Component Value Date   CHOL 182 12/28/2018   HDL 36 (L) 12/28/2018   LDLCALC 99 12/28/2018   TRIG 234 (H) 12/28/2018   CHOLHDL 5.1 (H) 12/28/2018   Patient states she did not actually take the Niacin due to concerns over some of the possible side effects.  Patient Active Problem List   Diagnosis Date Noted  . Celiac disease 10/14/2017  . Supraventricular tachycardia ([place]) 04/21/2015  . Arthropathia 01/16/2015  . Alopecia 01/16/2015  . 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 Benign hypertension 01/16/2015  . Abnormal results of thyroid function studies 01/16/2015  . Raynaud's syndrome without gangrene 01/16/2015  . Fatigue 01/16/2015    Past Medical History:  Diagnosis Date  . Abnormal weight gain 01/16/2015  . Hypercholesteremia   . Tachycardia    Past Surgical History:  Procedure Laterality Date  . ABDOMINAL HYSTERECTOMY  1990  . CHOLECYSTECTOMY  1989  . ROTATOR CUFF REPAIR Right 2007  . SKIN CANCER EXCISION  2011   ABOVE LIP   Family History  Adopted: Yes  Family history unknown: Yes    Social History   Socioeconomic History  . Marital status: Widowed    Spouse name: Not on file  . Number of children: Not on file  . Years of education: Not on file  . Highest education level: Not on file   Occupational History  . Not on file  Tobacco Use  . Smoking status: Current Every Day Smoker    Packs/day: 0.50    Years: 30.00    Pack years: 15.00    Types: Cigarettes  . Smokeless tobacco: Never Used  Substance and Sexual Activity  . Alcohol use: No    Alcohol/week: 0.0 standard drinks  . Drug use: No  . Sexual activity: Not on file  Other Topics Concern  . Not on file  Social History Narrative  . Not on file   Social Determinants of Health   Financial Resource Strain:   . Difficulty of Paying Living Expenses: Not on file  Food Insecurity:   . Worried About Charity fundraiser in the Last Year: Not on file  . Ran Out of Food in the Last Year: Not on file  Transportation Needs:   . Lack of Transportation (Medical): Not on file  . Lack of Transportation (Non-Medical): Not on file  Physical Activity:   . Days of Exercise per Week: Not on file  . Minutes of Exercise per Session: Not on file  Stress:   . Feeling of Stress : Not on file  Social Connections:   . Frequency of Communication with Friends and Family: Not on file  . Frequency of Social Gatherings with Friends and Family: Not on file  . Attends Religious Services: Not on file  . Active Member of  Clubs or Organizations: Not on file  . Attends Archivist Meetings: Not on file  . Marital Status: Not on file  Intimate Partner Violence:   . Fear of Current or Ex-Partner: Not on file  . Emotionally Abused: Not on file  . Physically Abused: Not on file  . Sexually Abused: Not on file    Outpatient Encounter Medications as of 07/01/2019  Medication Sig  . amLODipine (NORVASC) 5 MG tablet TAKE 1 TABLET BY MOUTH DAILY  . aspirin EC 81 MG tablet Take 81 mg by mouth daily.  Marland Kitchen dicyclomine (BENTYL) 10 MG capsule TAKE ONE CAPSULE BY MOUTH TWICE DAILY AS NEEDED  . metoprolol tartrate (LOPRESSOR) 25 MG tablet TAKE 1 TABLET BY MOUTH TWICE DAILY  . MULTIPLE VITAMIN PO Take 1 tablet by mouth daily.  . OMEGA-3  FATTY ACIDS PO Take 1 capsule by mouth daily.  . pravastatin (PRAVACHOL) 40 MG tablet TAKE 1 TABLET BY MOUTH DAILY   No facility-administered encounter medications on file as of 07/01/2019.    Allergies  Allergen Reactions  . Morphine Sulfate     BP drops    Review of Systems  Constitutional: Negative.   HENT:       Ringing in both ears.  Respiratory: Negative.   Cardiovascular: Negative.   Gastrointestinal: Positive for diarrhea.       ?Bloating firmness in epigastrium?  Musculoskeletal: Positive for joint pain.       Tender joints in the left hand.    Objective:  BP 120/80 (BP Location: Right Arm, Patient Position: Sitting, Cuff Size: Normal)   Pulse 66   Temp (!) 97.1 F (36.2 C) (Skin)   Wt 176 lb (79.8 kg)   SpO2 98%   BMI 28.41 kg/m   Physical Exam  Constitutional: She is oriented to person, place, and time and well-developed, well-nourished, and in no distress.  HENT:  Head: Normocephalic.  Eyes: Conjunctivae are normal.  Cardiovascular: Normal rate and regular rhythm.  Pulmonary/Chest: Effort normal and breath sounds normal.  Abdominal: Soft. Bowel sounds are normal.  Epigastric lump without pain. Questionable lipoma versus early ventral hernia. No organomegaly.   Musculoskeletal:        General: Tenderness present. Normal range of motion.     Cervical back: Neck supple.     Comments: Enlargement and tenderness of the first [facility] joint bilaterally. Also, left second MCP and DIP joints. Some nodules on the left 4th finger flexor tendon in the palm.  Neurological: She is alert and oriented to person, place, and time.  Skin: No rash noted.  Psychiatric: Mood, affect and judgment normal.    Assessment and Plan :  1. Benign hypertension BP well controlled on the Metoprolol 25 mg BID and takes Norvasc 5 mg qd for Raynaud's Phenomena prn. No chest pains, dyspnea or hypotensive episodes. Check routine labs and follow up pending reports. - CBC with Differential -  Comprehensive Metabolic Panel (CMET) - Lipid Profile  2. Celiac disease Trying to follow a gluten-free diet and uses Bentyl 10 mg BID prn spasms and bloating. Feeling improved with dietary changes. Followed by Dr. Marius Ditch (gastroenterologist). - CBC with Differential - Comprehensive Metabolic Panel (CMET)  3. Combined fat and carbohydrate induced hyperlipemia Taking Omega-3 Fish Oil and Pravastatin 40 mg qd. Continue low fat diet and recheck labs.  - Comprehensive Metabolic Panel (CMET) - Lipid Profile  4. Supraventricular tachycardia ([facility]) Normal heart rate today. Well controlled with Metoprolol.  5. Arthritis of both hands Aches  and pains in multiple joints of hands. 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 Check CBC for infection and sedrate to assess inflammation control. May use topical NSAID (Aspercreme or Voltaren Gel). - CBC with Differential - Sed Rate (ESR)

## 2019-07-03 ENCOUNTER — Other Ambulatory Visit: Payer: Self-pay | Admitting: Family Medicine

## 2019-07-03 DIAGNOSIS — K580 Irritable bowel syndrome with diarrhea: Secondary | ICD-10-CM

## 2019-07-07 LAB — COMPREHENSIVE METABOLIC PANEL
ALT: 64 IU/L — ABNORMAL HIGH (ref 0–32)
AST: 46 IU/L — ABNORMAL HIGH (ref 0–40)
Albumin/Globulin Ratio: 1.9 (ref 1.2–2.2)
Albumin: 4.4 g/dL (ref 3.8–4.8)
Alkaline Phosphatase: 81 IU/L (ref 39–117)
BUN/Creatinine Ratio: 28 (ref 12–28)
BUN: 15 mg/dL (ref 8–27)
Bilirubin Total: 0.2 mg/dL (ref 0.0–1.2)
CO2: 21 mmol/L (ref 20–29)
Calcium: 9.4 mg/dL (ref 8.7–10.3)
Chloride: 104 mmol/L (ref 96–106)
Creatinine, Ser: 0.53 mg/dL — ABNORMAL LOW (ref 0.57–1.00)
GFR calc Af Amer: 116 mL/min/{1.73_m2} (ref >59)
GFR calc non Af Amer: 101 mL/min/{1.73_m2} (ref >59)
Globulin, Total: 2.3 g/dL (ref 1.5–4.5)
Glucose: 108 mg/dL — ABNORMAL HIGH (ref 65–99)
Potassium: 4.3 mmol/L (ref 3.5–5.2)
Sodium: 140 mmol/L (ref 134–144)
Total Protein: 6.7 g/dL (ref 6.0–8.5)

## 2019-07-07 LAB — LIPID PANEL
Chol/HDL Ratio: 4.4 ratio (ref 0.0–4.4)
Cholesterol, Total: 175 mg/dL (ref 100–199)
HDL: 40 mg/dL (ref >39)
LDL Chol Calc (NIH): 86 mg/dL (ref 0–99)
Triglycerides: 296 mg/dL — ABNORMAL HIGH (ref 0–149)
VLDL Cholesterol Cal: 49 mg/dL — ABNORMAL HIGH (ref 5–40)

## 2019-07-07 LAB — CBC WITH DIFFERENTIAL/PLATELET
Basophils Absolute: 0.1 10*3/uL (ref 0.0–0.2)
Basos: 1 %
EOS (ABSOLUTE): 0.1 10*3/uL (ref 0.0–0.4)
Eos: 2 %
Hematocrit: 46.1 % (ref 34.0–46.6)
Hemoglobin: 15.9 g/dL (ref 11.1–15.9)
Immature Grans (Abs): 0 10*3/uL (ref 0.0–0.1)
Immature Granulocytes: 0 %
Lymphocytes Absolute: 2.1 10*3/uL (ref 0.7–3.1)
Lymphs: 32 %
MCH: 32.5 pg (ref 26.6–33.0)
MCHC: 34.5 g/dL (ref 31.5–35.7)
MCV: 94 fL (ref 79–97)
Monocytes Absolute: 0.7 10*3/uL (ref 0.1–0.9)
Monocytes: 11 %
Neutrophils Absolute: 3.6 10*3/uL (ref 1.4–7.0)
Neutrophils: 54 %
Platelets: 240 10*3/uL (ref 150–450)
RBC: 4.89 x10E6/uL (ref 3.77–5.28)
RDW: 12.8 % (ref 11.7–15.4)
WBC: 6.7 10*3/uL (ref 3.4–10.8)

## 2019-07-07 LAB — SEDIMENTATION RATE: Sed Rate: 6 mm/hr (ref 0–40)

## 2019-07-13 ENCOUNTER — Telehealth: Payer: Self-pay

## 2019-07-13 ENCOUNTER — Ambulatory Visit: Payer: Self-pay

## 2019-07-13 ENCOUNTER — Encounter: Payer: Self-pay | Admitting: Family Medicine

## 2019-07-13 NOTE — Telephone Encounter (Signed)
-----   Message from Margo Common, [place] sent at 07/11/2019  8:06 PM EST ----- Blood tests shows high triglycerides, borderline blood sugar and elevated liver enzymes. This can occur if too much fats or sweets in diet. Should restrict fats and sweets in diet, May add Flush Free Niacin 500 mg qd (OTC) to the Pravastatin to try to correct these levels. Recheck progress in 3 months.

## 2019-07-13 NOTE — Telephone Encounter (Signed)
LMTCB, [employer] for triage to give results

## 2019-07-13 NOTE — Telephone Encounter (Signed)
Provided lab result of Katelyn Bartlett 07/11/19 to Patient.  Voiced understanding.

## 2019-07-30 ENCOUNTER — Encounter: Payer: Self-pay | Admitting: Family Medicine

## 2019-08-25 ENCOUNTER — Encounter: Payer: Self-pay | Admitting: Family Medicine

## 2019-08-26 ENCOUNTER — Other Ambulatory Visit: Payer: Self-pay | Admitting: Family Medicine

## 2019-08-26 DIAGNOSIS — R748 Abnormal levels of other serum enzymes: Secondary | ICD-10-CM

## 2019-08-26 DIAGNOSIS — R7309 Other abnormal glucose: Secondary | ICD-10-CM

## 2019-08-26 DIAGNOSIS — E782 Mixed hyperlipidemia: Secondary | ICD-10-CM

## 2019-08-26 NOTE — Telephone Encounter (Signed)
Will leave future order for labs to follow up elevated blood sugar, lipids and liver enzymes.

## 2019-09-21 ENCOUNTER — Encounter: Payer: Self-pay | Admitting: Family Medicine

## 2019-09-30 ENCOUNTER — Ambulatory Visit: Payer: Self-pay | Admitting: Family Medicine

## 2019-09-30 ENCOUNTER — Other Ambulatory Visit: Payer: Self-pay

## 2019-09-30 DIAGNOSIS — E782 Mixed hyperlipidemia: Secondary | ICD-10-CM

## 2019-09-30 DIAGNOSIS — R748 Abnormal levels of other serum enzymes: Secondary | ICD-10-CM

## 2019-09-30 DIAGNOSIS — R7309 Other abnormal glucose: Secondary | ICD-10-CM

## 2019-10-01 ENCOUNTER — Other Ambulatory Visit: Payer: Self-pay | Admitting: Family Medicine

## 2019-10-01 ENCOUNTER — Other Ambulatory Visit: Payer: Self-pay | Admitting: *Deleted

## 2019-10-01 ENCOUNTER — Encounter: Payer: Self-pay | Admitting: Family Medicine

## 2019-10-01 DIAGNOSIS — K580 Irritable bowel syndrome with diarrhea: Secondary | ICD-10-CM

## 2019-10-01 DIAGNOSIS — E782 Mixed hyperlipidemia: Secondary | ICD-10-CM

## 2019-10-01 LAB — COMPREHENSIVE METABOLIC PANEL
ALT: 66 IU/L — ABNORMAL HIGH (ref 0–32)
AST: 44 IU/L — ABNORMAL HIGH (ref 0–40)
Albumin/Globulin Ratio: 2.1 (ref 1.2–2.2)
Albumin: 4.5 g/dL (ref 3.8–4.8)
Alkaline Phosphatase: 83 IU/L (ref 39–117)
BUN/Creatinine Ratio: 15 (ref 12–28)
BUN: 10 mg/dL (ref 8–27)
Bilirubin Total: 0.4 mg/dL (ref 0.0–1.2)
CO2: 21 mmol/L (ref 20–29)
Calcium: 9.3 mg/dL (ref 8.7–10.3)
Chloride: 103 mmol/L (ref 96–106)
Creatinine, Ser: 0.65 mg/dL (ref 0.57–1.00)
GFR calc Af Amer: 109 mL/min/{1.73_m2} (ref >59)
GFR calc non Af Amer: 94 mL/min/{1.73_m2} (ref >59)
Globulin, Total: 2.1 g/dL (ref 1.5–4.5)
Glucose: 96 mg/dL (ref 65–99)
Potassium: 4 mmol/L (ref 3.5–5.2)
Sodium: 142 mmol/L (ref 134–144)
Total Protein: 6.6 g/dL (ref 6.0–8.5)

## 2019-10-01 LAB — CBC WITH DIFFERENTIAL/PLATELET
Basophils Absolute: 0.1 10*3/uL (ref 0.0–0.2)
Basos: 1 %
EOS (ABSOLUTE): 0.1 10*3/uL (ref 0.0–0.4)
Eos: 2 %
Hematocrit: 47.1 % — ABNORMAL HIGH (ref 34.0–46.6)
Hemoglobin: 15.9 g/dL (ref 11.1–15.9)
Immature Grans (Abs): 0 10*3/uL (ref 0.0–0.1)
Immature Granulocytes: 0 %
Lymphocytes Absolute: 2.4 10*3/uL (ref 0.7–3.1)
Lymphs: 33 %
MCH: 32 pg (ref 26.6–33.0)
MCHC: 33.8 g/dL (ref 31.5–35.7)
MCV: 95 fL (ref 79–97)
Monocytes Absolute: 0.7 10*3/uL (ref 0.1–0.9)
Monocytes: 9 %
Neutrophils Absolute: 4.1 10*3/uL (ref 1.4–7.0)
Neutrophils: 55 %
Platelets: 251 10*3/uL (ref 150–450)
RBC: 4.97 x10E6/uL (ref 3.77–5.28)
RDW: 12.4 % (ref 11.7–15.4)
WBC: 7.3 10*3/uL (ref 3.4–10.8)

## 2019-10-01 LAB — LIPID PANEL
Chol/HDL Ratio: 5.2 ratio — ABNORMAL HIGH (ref 0.0–4.4)
Cholesterol, Total: 182 mg/dL (ref 100–199)
HDL: 35 mg/dL — ABNORMAL LOW (ref >39)
LDL Chol Calc (NIH): 109 mg/dL — ABNORMAL HIGH (ref 0–99)
Triglycerides: 217 mg/dL — ABNORMAL HIGH (ref 0–149)
VLDL Cholesterol Cal: 38 mg/dL (ref 5–40)

## 2019-10-01 MED ORDER — ATORVASTATIN CALCIUM 40 MG PO TABS: 40.0000 mg | ORAL_TABLET | Freq: Every day | ORAL | 3 refills | Status: DC

## 2019-11-18 ENCOUNTER — Ambulatory Visit (INDEPENDENT_AMBULATORY_CARE_PROVIDER_SITE_OTHER): Payer: Self-pay | Admitting: Physician Assistant

## 2019-11-18 ENCOUNTER — Ambulatory Visit
Admission: RE | Admit: 2019-11-18 | Discharge: 2019-11-18 | Disposition: A | Discharge status: Home / Self Care | Payer: Self-pay | Attending: Physician Assistant | Admitting: Physician Assistant

## 2019-11-18 ENCOUNTER — Other Ambulatory Visit: Payer: Self-pay

## 2019-11-18 ENCOUNTER — Encounter: Payer: Self-pay | Admitting: Physician Assistant

## 2019-11-18 ENCOUNTER — Ambulatory Visit
Admission: RE | Admit: 2019-11-18 | Discharge: 2019-11-18 | Disposition: A | Discharge status: Home / Self Care | Payer: Self-pay | Source: Ambulatory Visit | Attending: Physician Assistant | Admitting: Physician Assistant

## 2019-11-18 VITALS — BP 114/69 | HR 87 | Temp 96.0°F | Wt 174.2 lb

## 2019-11-18 DIAGNOSIS — S91301A Unspecified open wound, right foot, initial encounter: Secondary | ICD-10-CM

## 2019-11-18 DIAGNOSIS — M79672 Pain in left foot: Secondary | ICD-10-CM | POA: Insufficient documentation

## 2019-11-18 IMAGING — CR DG FOOT COMPLETE 3+V*L*
1 series · 3 of 3 positions shown · non-contrast
Comparison: None.

CLINICAL DATA: Status post fall.

EXAM:
LEFT FOOT - COMPLETE 3+ VIEW

[Series 1: dg foot complete left · 0.14mm/px · 3 of 3 slices shown]
[im 1/3]
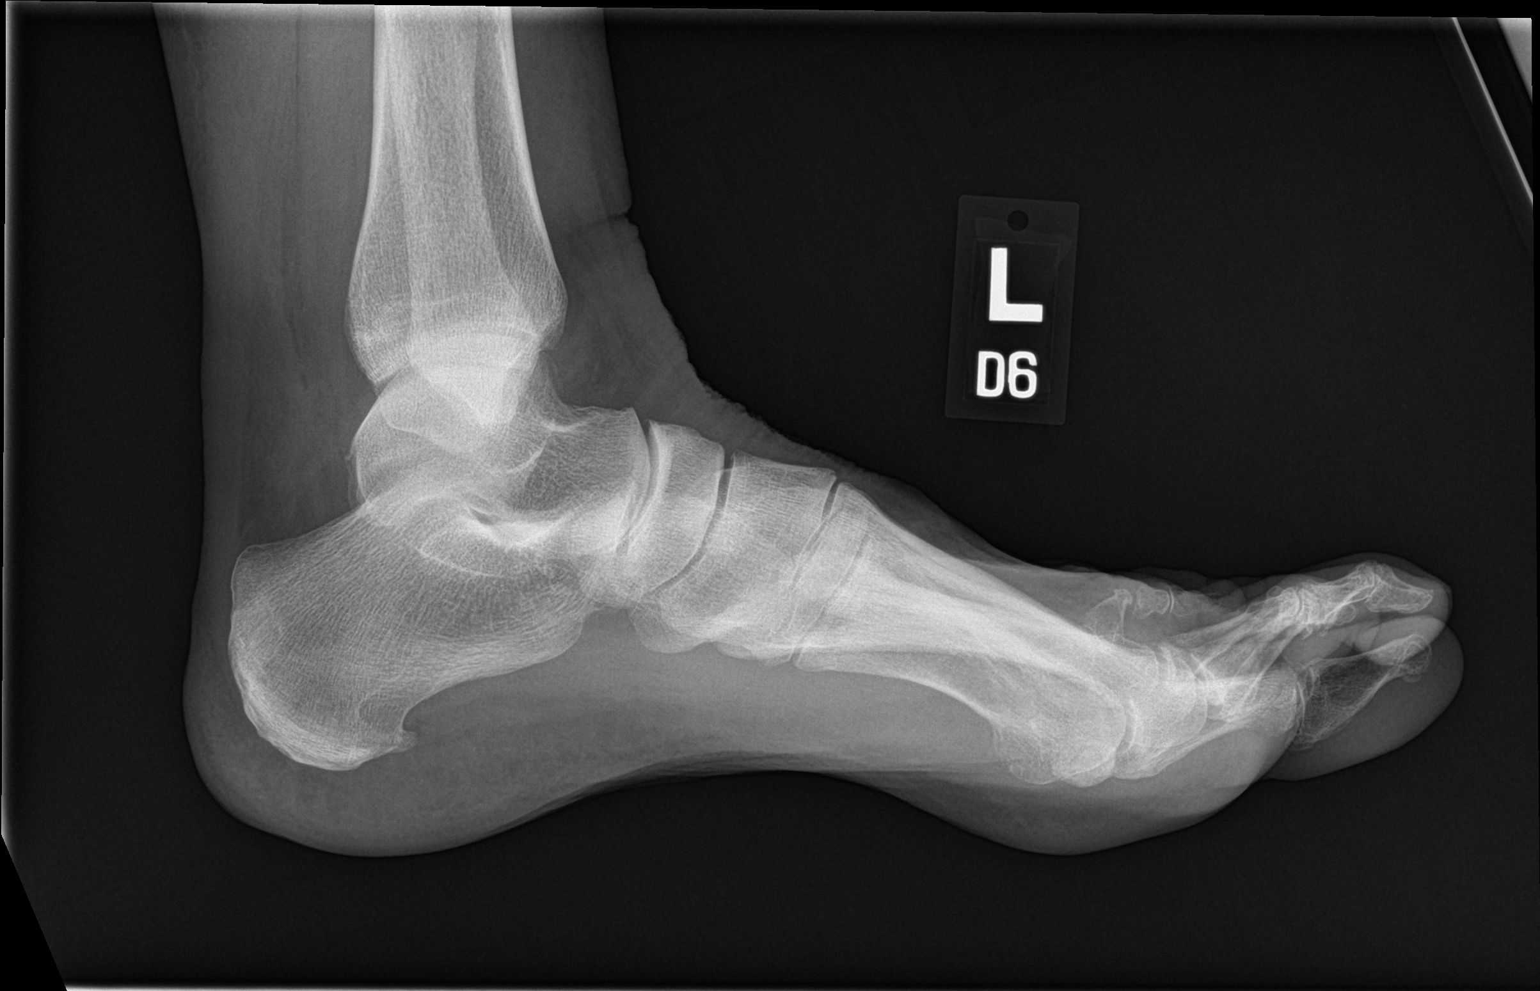
[im 2/3]
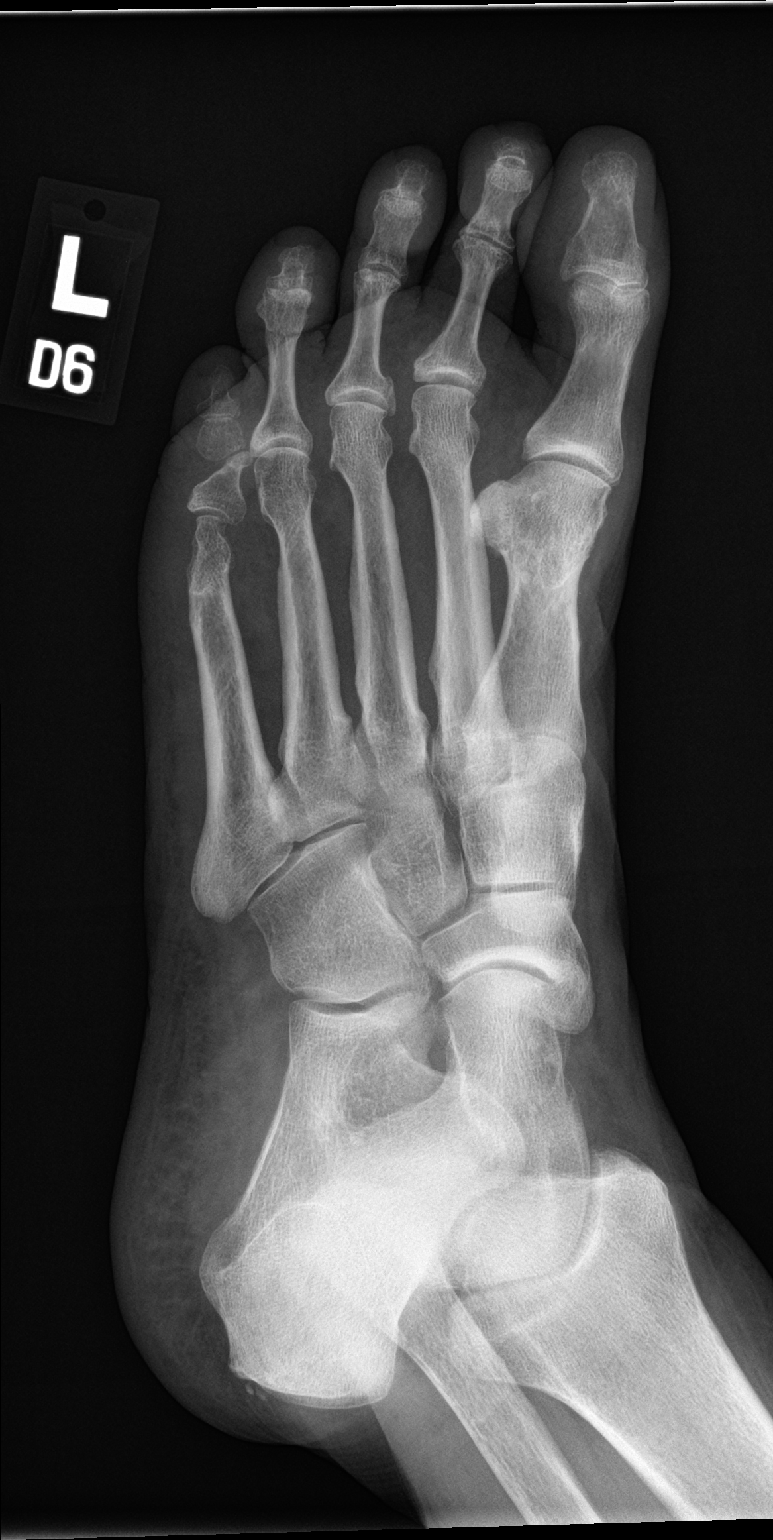
[im 3/3]
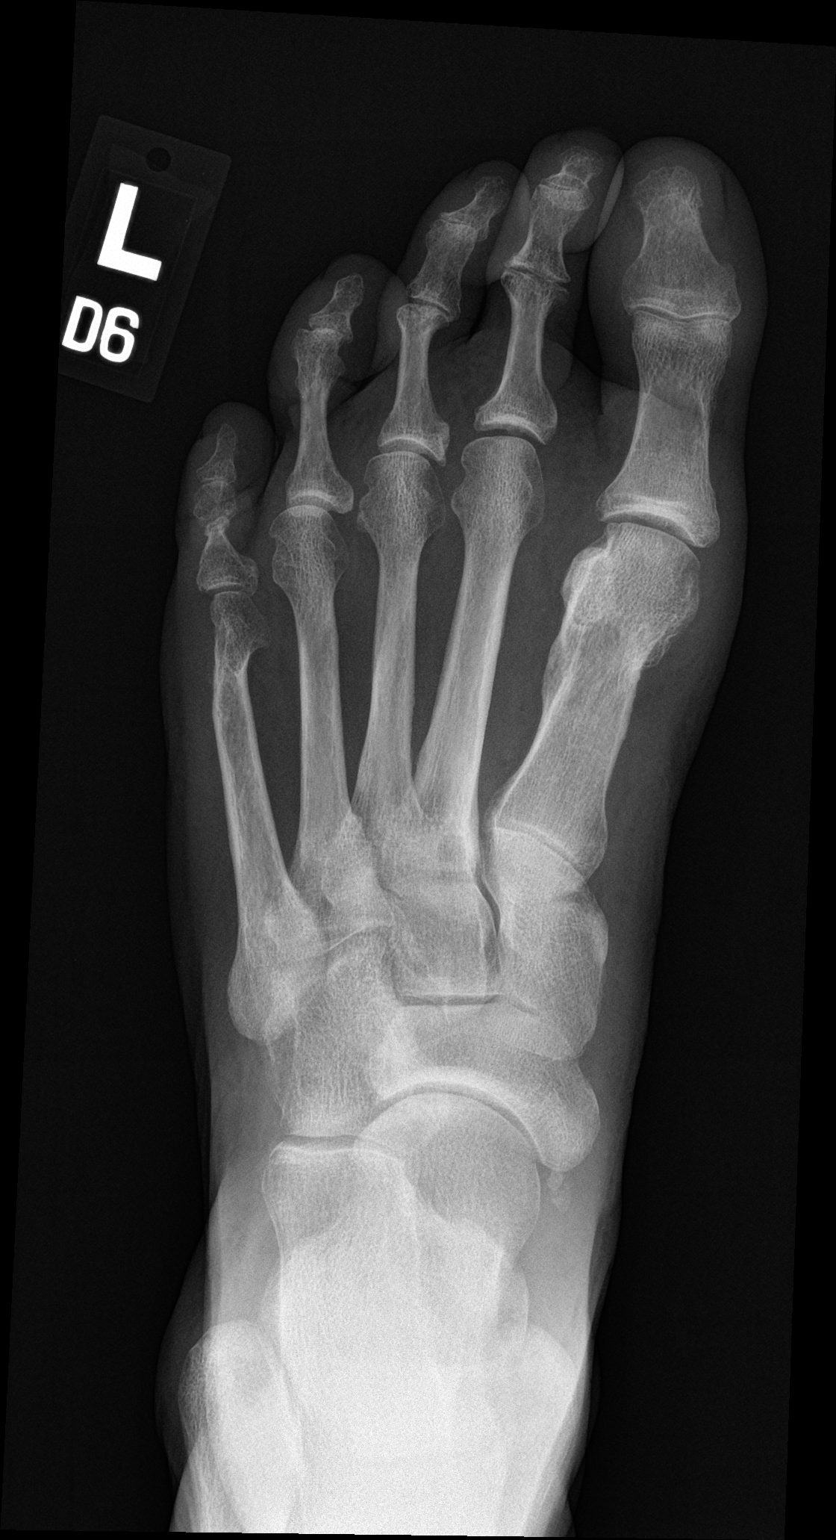

[3 of 3 positions shown; findings below may reference images not displayed]

FINDINGS: There is no evidence of an acute fracture or dislocation. A chronic
deformity is seen involving the distal aspects of the fifth left
metatarsal and proximal phalanx of the fifth left toe. There is no
evidence of arthropathy or other focal bone abnormality. Soft
tissues are unremarkable.
IMPRESSION: No acute osseous abnormality.

## 2019-11-18 IMAGING — CR DG ANKLE COMPLETE 3+V*L*
1 series · 3 of 3 positions shown · non-contrast
Comparison: None.

CLINICAL DATA: Status post fall.

EXAM:
LEFT ANKLE COMPLETE - 3+ VIEW

[Series 1: dg ankle complete left · 0.14mm/px · 3 of 3 slices shown]
[im 1/3]
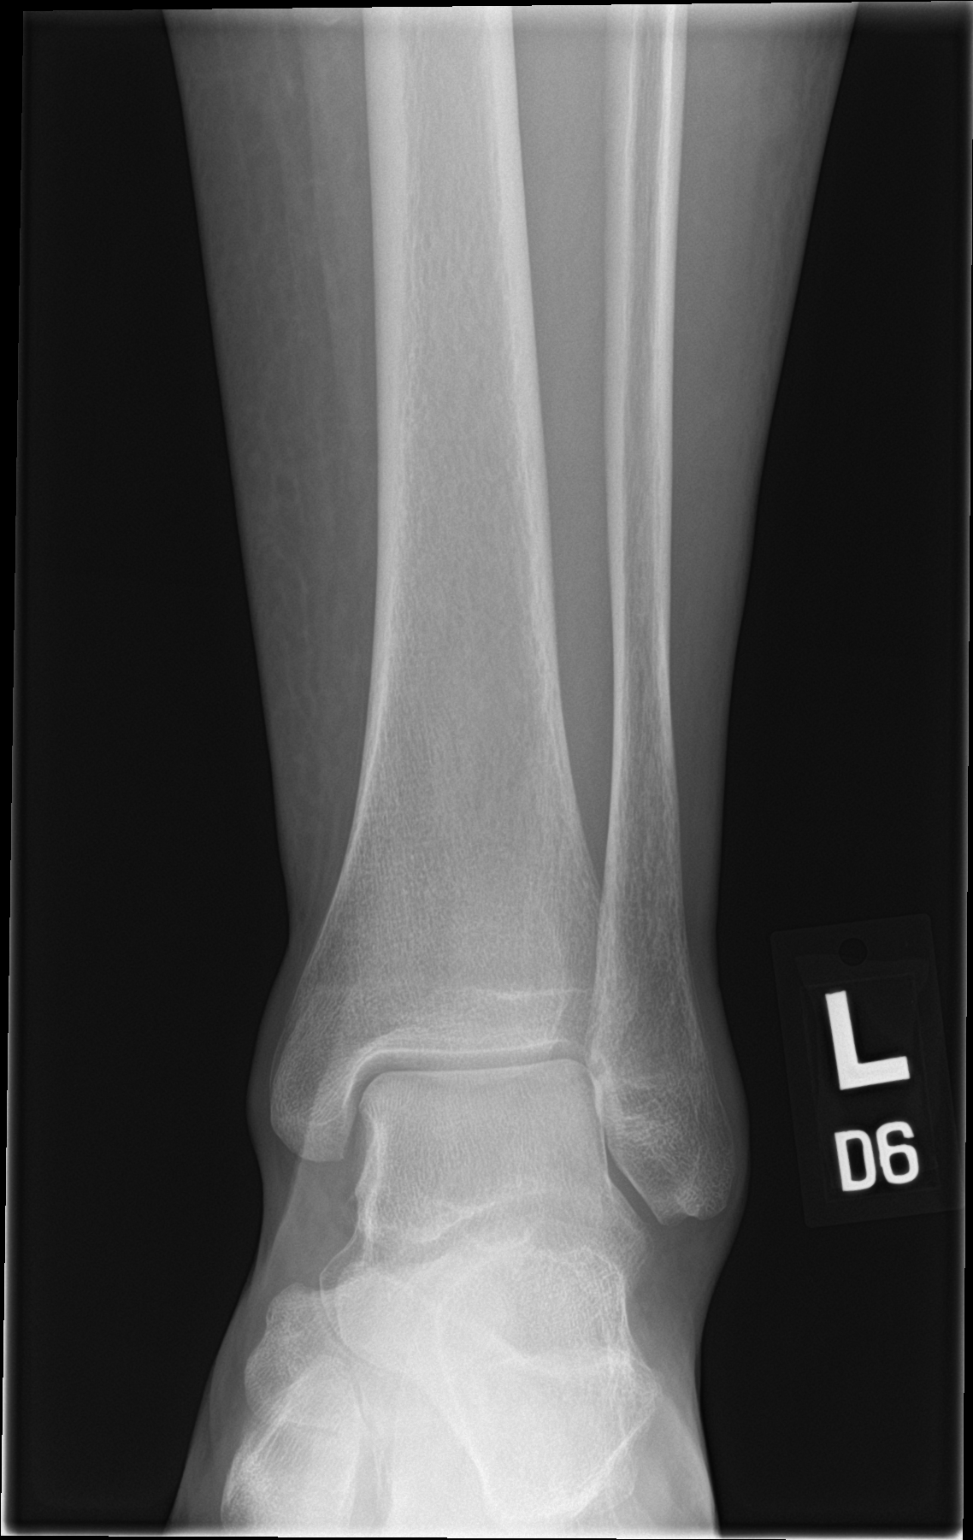
[im 2/3]
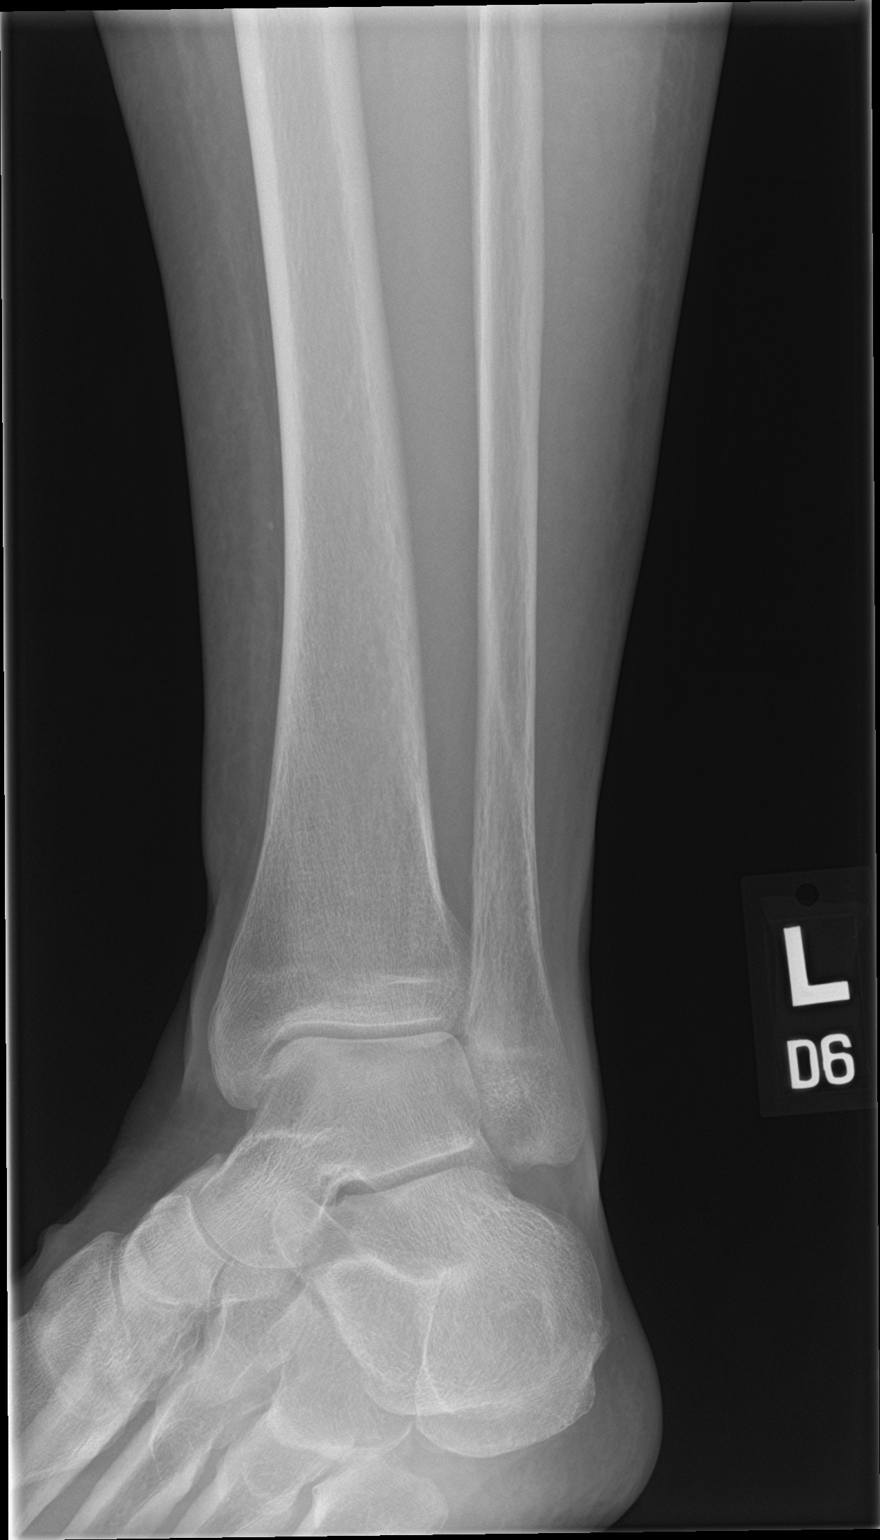
[im 3/3]
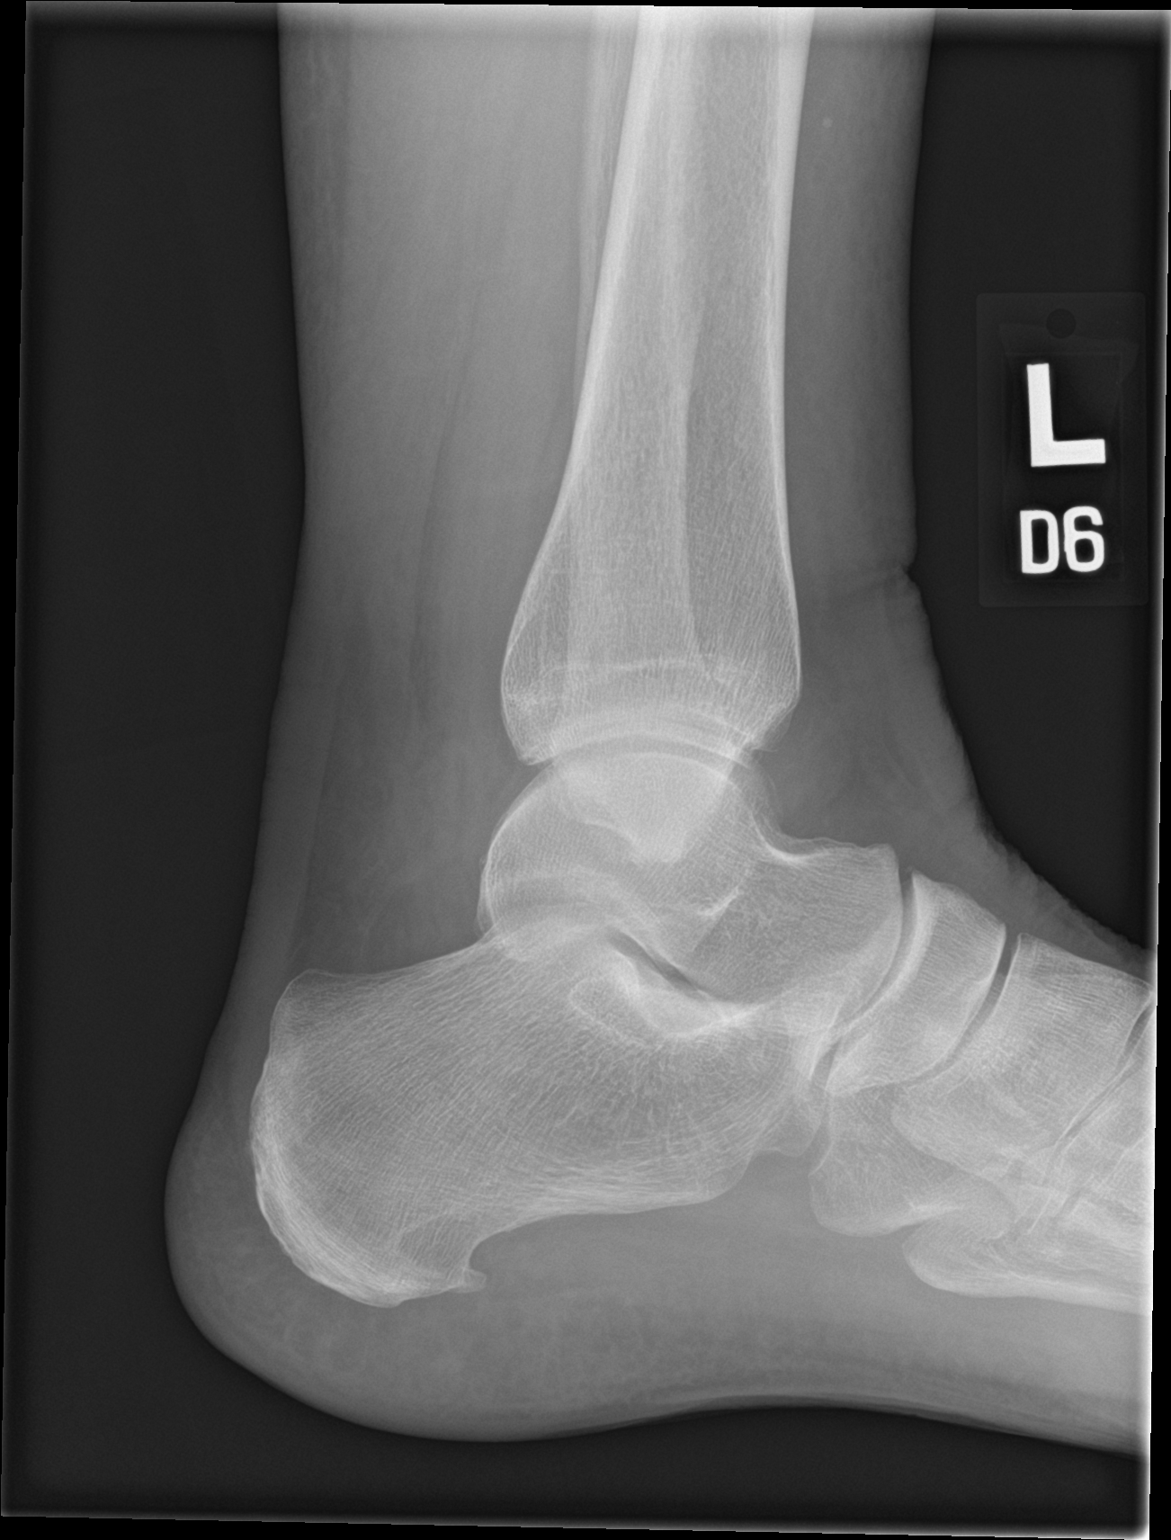

[3 of 3 positions shown; findings below may reference images not displayed]

FINDINGS: There is no evidence of fracture, dislocation, or joint effusion.
There is no evidence of arthropathy or other focal bone abnormality.
Soft tissues are unremarkable.
IMPRESSION: Negative.

## 2019-11-18 IMAGING — CR DG TIBIA/FIBULA 2V*L*
1 series · 4 of 4 positions shown · non-contrast
Comparison: None.

CLINICAL DATA: Status post fall.

EXAM:
LEFT TIBIA AND FIBULA - 2 VIEW

[Series 1: dg tibia/fibula left · 0.14mm/px · 4 of 4 slices shown]
[im 1/4]
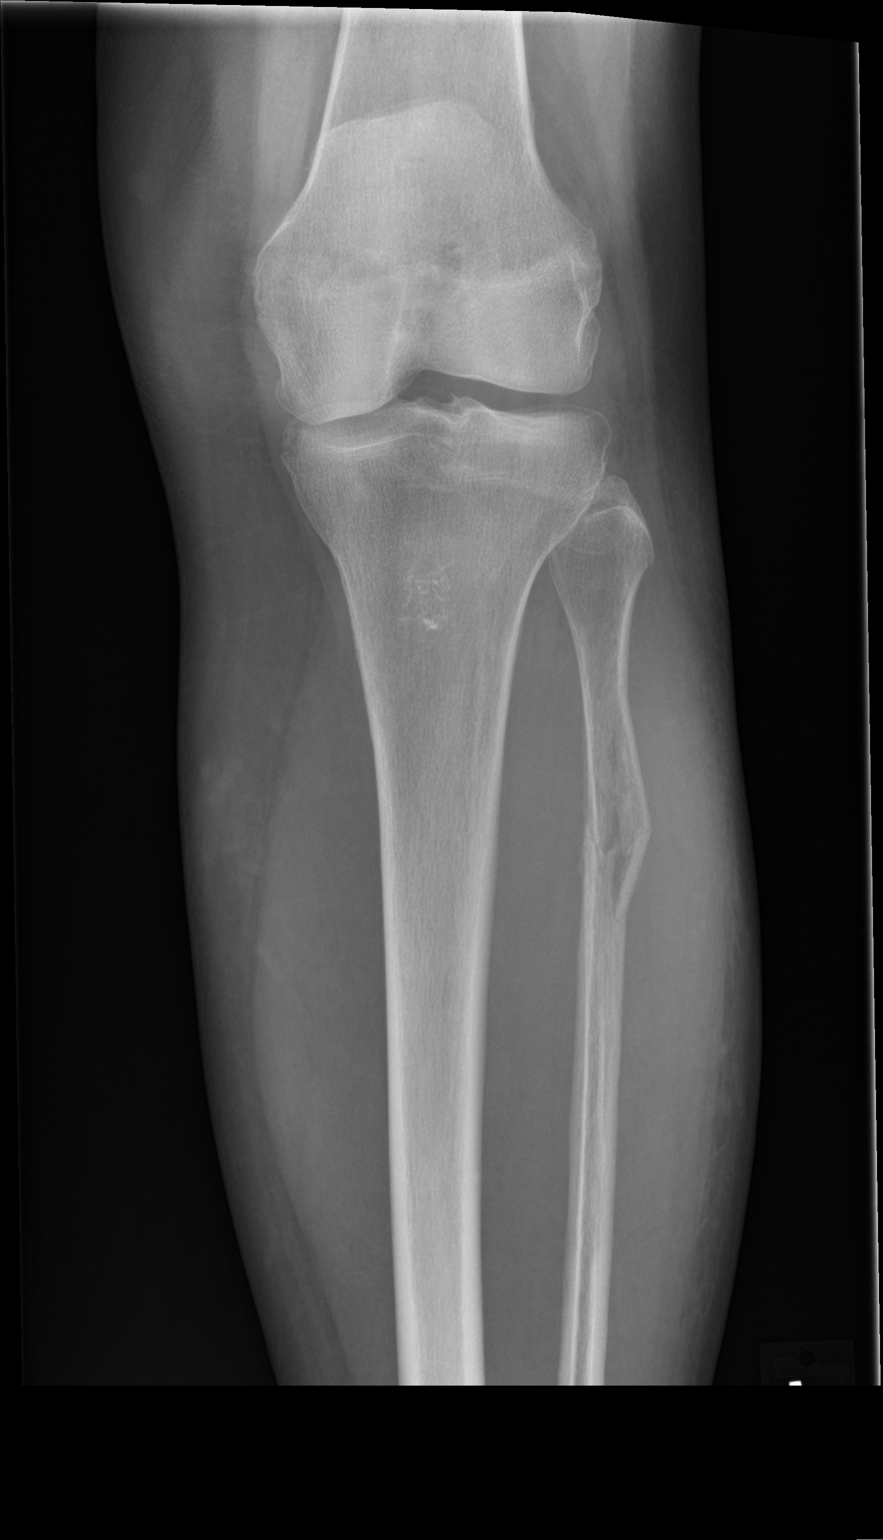
[im 2/4]
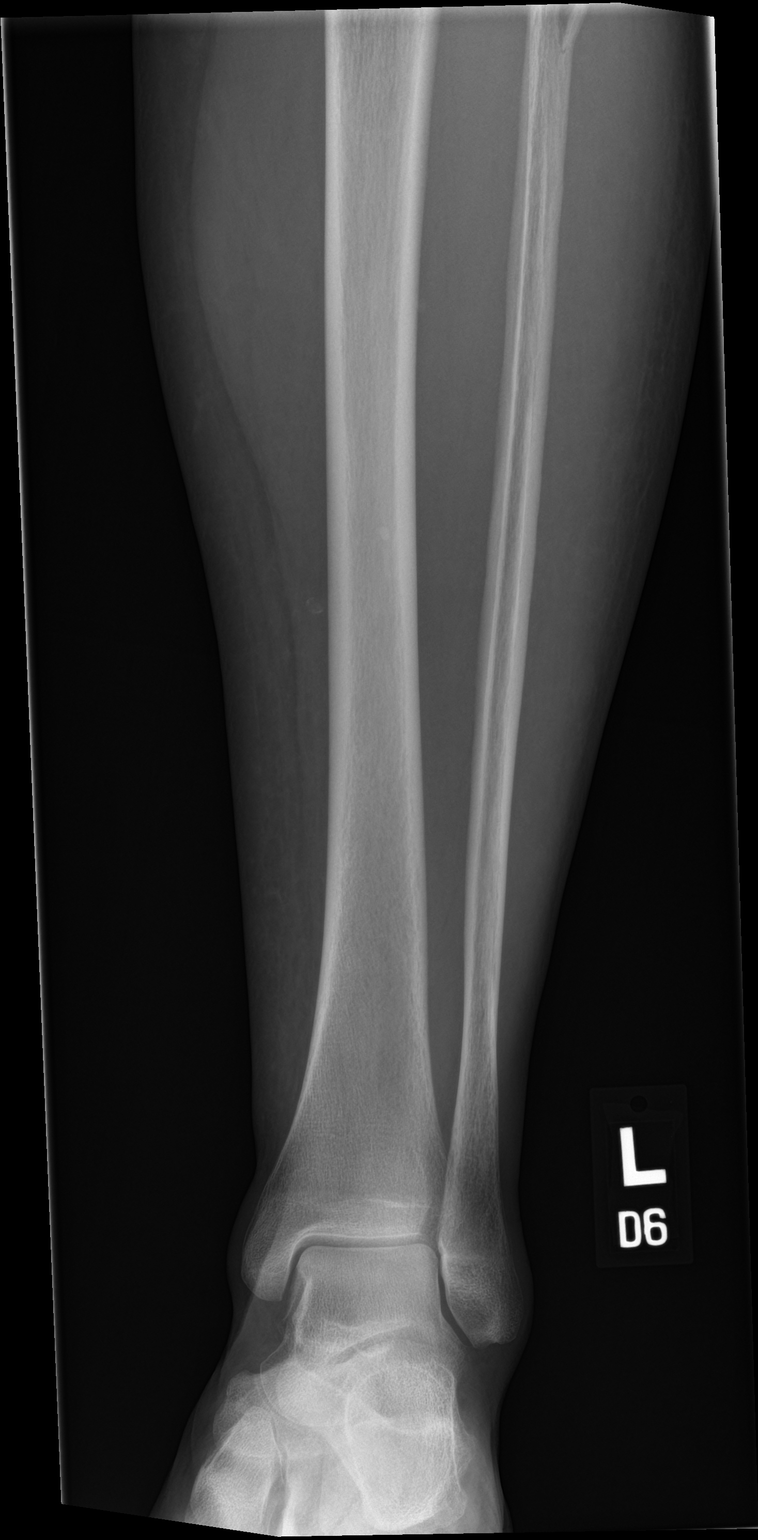
[im 3/4]
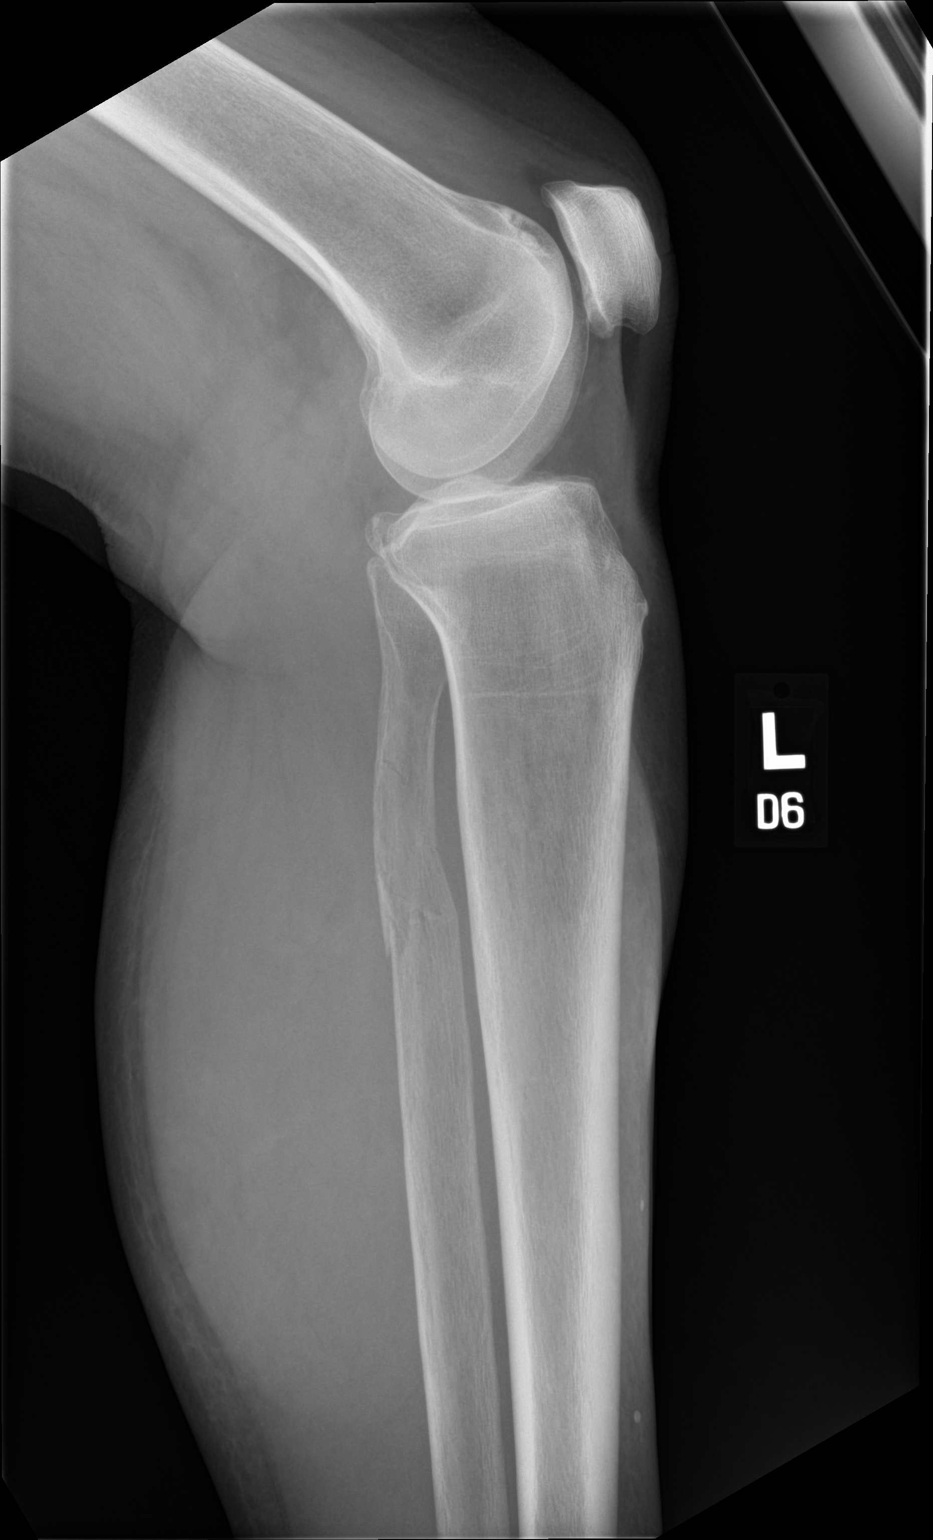
[im 4/4]
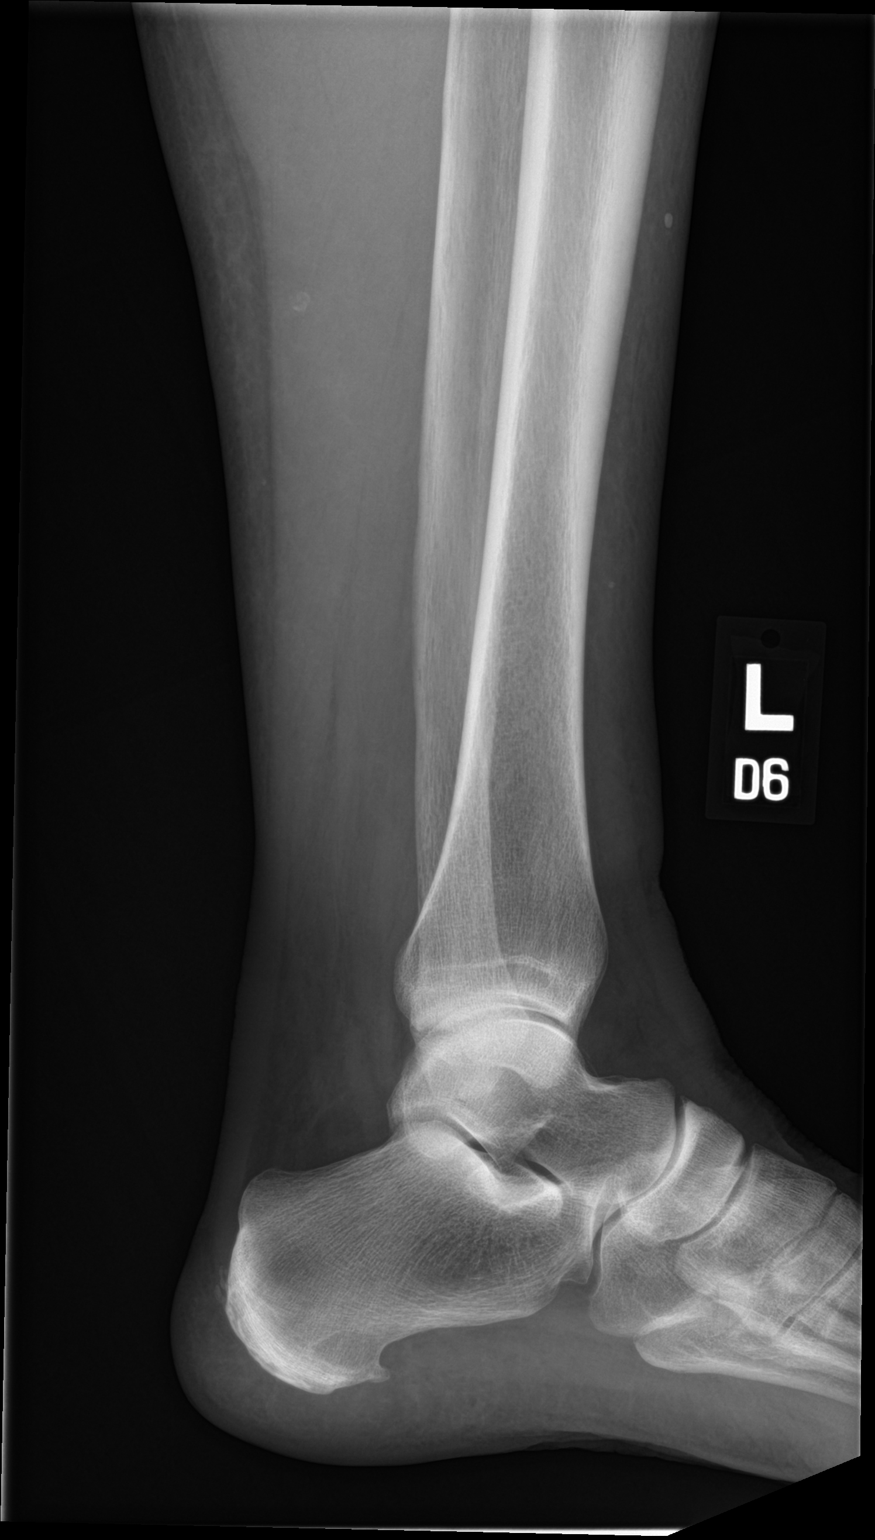

[4 of 4 positions shown; findings below may reference images not displayed]

FINDINGS: Acute fracture deformity is seen involving the proximal shaft of the
left fibula. There is no evidence of dislocation. Mild soft tissue
swelling is seen adjacent to the previously noted fracture site.
IMPRESSION: Acute fracture of the proximal left fibula.

## 2019-11-18 MED ORDER — MUPIROCIN CALCIUM 2 % EX CREA: 1.0000 "application " | TOPICAL_CREAM | Freq: Two times a day (BID) | CUTANEOUS | 0 refills | Status: DC

## 2019-11-18 NOTE — Progress Notes (Signed)
Established patient visit   Patient: Katelyn Bartlett   DOB: 1956/04/08   64 y.o. Female  MRN: [id number] Visit Date: 11/18/2019  Today's healthcare provider: Trinna Post, PA-C   Chief Complaint  Patient presents with  . Fall   Esperanza Heir [place],acting as a Education administrator for [employer], PA-C.,have documented all relevant documentation on the behalf of Trinna Post, PA-C,as directed by  Trinna Post, PA-C while in the presence of Trinna Post, PA-C.  Subjective    Fall Incident onset: Sunday. The fall occurred while standing. She landed on concrete. There was no blood loss. The point of impact was the left knee and right knee. The pain is present in the left foot and right foot. Treatments tried: foot brace. The treatment provided mild relief.    Patient fell on 11/14/2019. Patient was seen at [facility] and received a right foot and ankle xray which showed as nondisplaced fracture of lateral malleolus extending into the ankle mortise.She was given a walker boot and referred to [facility] podiatry. She has a appointment on Tuesday. She reports right ankle is not painful currently however the walker boot seems to have aggravated the wound on her right ankle.   Her left ankle and leg is painful. The pain extend from her foot to her knee. She has noticed some bruising in her foot as well.      Medications: Outpatient Medications Prior to Visit  Medication Sig  . amLODipine (NORVASC) 5 MG tablet TAKE 1 TABLET BY MOUTH DAILY  . aspirin EC 81 MG tablet Take 81 mg by mouth daily.  Marland Kitchen atorvastatin (LIPITOR) 40 MG tablet Take 1 tablet (40 mg total) by mouth daily.  Marland Kitchen dicyclomine (BENTYL) 10 MG capsule TAKE ONE CAPSULE BY MOUTH TWICE DAILY AS NEEDED  . metoprolol tartrate (LOPRESSOR) 25 MG tablet TAKE 1 TABLET BY MOUTH TWICE DAILY  . MULTIPLE VITAMIN PO Take 1 tablet by mouth daily.  . OMEGA-3 FATTY ACIDS PO Take 1 capsule by mouth daily.  . pravastatin (PRAVACHOL) 40 MG  tablet TAKE 1 TABLET BY MOUTH DAILY   No facility-administered medications prior to visit.    Review of Systems    Objective    BP 114/69 (BP Location: Right Arm, Patient Position: Sitting, Cuff Size: Normal)   Pulse 87   Temp (!) 96 F (35.6 C) (Temporal)   Wt 174 lb 3.2 oz (79 kg)   BMI 28.12 kg/m    Physical Exam Constitutional:      Appearance: Normal appearance.  Cardiovascular:     Rate and Rhythm: Normal rate and regular rhythm.     Pulses: Normal pulses.          Dorsalis pedis pulses are 2+ on the right side and 2+ on the left side.       Posterior tibial pulses are 2+ on the right side and 2+ on the left side.     Heart sounds: Normal heart sounds.  Pulmonary:     Effort: Pulmonary effort is normal.     Breath sounds: Normal breath sounds.  Musculoskeletal:     Comments: Multiple bruises on right anterior lower extremity  1 cm shallow wound on right dorsum foot Smaller wound just medial to larger wound  Right foot swollen   bruising on left foot just superior to 2nd and 3rd toe Left foot is not swollen Bruise on medial aspect on sole of left foot   Skin:  General: Skin is warm and dry.     Capillary Refill: Capillary refill takes less than 2 seconds.  Neurological:     Mental Status: She is alert and oriented to person, place, and time. Mental status is at baseline.  Psychiatric:        Mood and Affect: Mood normal.        Behavior: Behavior normal.       No results found for any visits on 11/18/19.  Assessment & Plan    1. Foot pain, left Right ankle fracture Wound on right ankle Left ankle pain evaluate as below, avoid weight on right ankle Depending on xray results may need to follow up with Orthopedics  - DG Foot 2 Views Left; Future - DG Ankle Complete Left; Future - DG Tibia/Fibula Left; Future  2. Wound of right foot  - mupirocin cream (BACTROBAN) 2 %; Apply 1 application topically 2 (two) times daily.  Dispense: 15 g; Refill:  0   Return if symptoms worsen or fail to improve.      ITrinna Post, PA-C, have reviewed all documentation for this visit. The documentation on 11/18/19 for the exam, diagnosis, procedures, and orders are all accurate and complete.    Paulene Floor  [facility] [phone] (phone) [phone] (fax)  [place]

## 2019-11-19 ENCOUNTER — Telehealth: Payer: Self-pay | Admitting: *Deleted

## 2019-11-19 NOTE — Telephone Encounter (Signed)
Pt called for xray results and wanted to know if she will need to go elsewhere . Pt has to rely on transportation and was advised that the nurse will call her back today/ please advise

## 2019-11-19 NOTE — Telephone Encounter (Signed)
Katelyn Bartlett: L tib/fib x-ray call report- report is in patient's chart and available for PCP review.

## 2019-11-19 NOTE — Telephone Encounter (Signed)
Patient was seen by Fabio Bering yesterday, order for xray was placed by her. Please review. KW

## 2019-11-19 NOTE — Telephone Encounter (Signed)
I have resulted this in the MyChart messages.

## 2019-11-21 ENCOUNTER — Encounter: Payer: Self-pay | Admitting: Physician Assistant

## 2019-11-21 DIAGNOSIS — L089 Local infection of the skin and subcutaneous tissue, unspecified: Secondary | ICD-10-CM

## 2019-11-22 DIAGNOSIS — S8291XA Unspecified fracture of right lower leg, initial encounter for closed fracture: Secondary | ICD-10-CM | POA: Diagnosis not present

## 2019-11-22 DIAGNOSIS — S82402A Unspecified fracture of shaft of left fibula, initial encounter for closed fracture: Secondary | ICD-10-CM | POA: Diagnosis not present

## 2019-11-22 MED ORDER — AMOXICILLIN-POT CLAVULANATE 875-125 MG PO TABS: 1.0000 | ORAL_TABLET | Freq: Two times a day (BID) | ORAL | 0 refills | Status: AC

## 2019-11-22 NOTE — Telephone Encounter (Signed)
Patient has appt with Dr. Vickki Muff on 11/23/2019

## 2019-11-29 DIAGNOSIS — S8290XD Unspecified fracture of unspecified lower leg, subsequent encounter for closed fracture with routine healing: Secondary | ICD-10-CM | POA: Diagnosis not present

## 2019-11-29 DIAGNOSIS — S8291XD Unspecified fracture of right lower leg, subsequent encounter for closed fracture with routine healing: Secondary | ICD-10-CM | POA: Diagnosis not present

## 2019-12-02 DIAGNOSIS — S82899A Other fracture of unspecified lower leg, initial encounter for closed fracture: Secondary | ICD-10-CM | POA: Insufficient documentation

## 2019-12-07 ENCOUNTER — Ambulatory Visit: Payer: Medicare HMO | Admitting: Gastroenterology

## 2019-12-07 ENCOUNTER — Other Ambulatory Visit: Payer: Self-pay

## 2019-12-07 ENCOUNTER — Encounter: Payer: Self-pay | Admitting: Gastroenterology

## 2019-12-07 VITALS — BP 127/76 | HR 83 | Temp 98.0°F | Ht 66.0 in | Wt 174.0 lb

## 2019-12-07 DIAGNOSIS — R1900 Intra-abdominal and pelvic swelling, mass and lump, unspecified site: Secondary | ICD-10-CM | POA: Diagnosis not present

## 2019-12-07 DIAGNOSIS — K900 Celiac disease: Secondary | ICD-10-CM | POA: Diagnosis not present

## 2019-12-07 MED ORDER — AMITRIPTYLINE HCL 25 MG PO TABS: 25.0000 mg | ORAL_TABLET | Freq: Every day | ORAL | 0 refills | Status: DC

## 2019-12-07 NOTE — Patient Instructions (Addendum)
You are due for a Colonoscopy and EGD. Please give [place] a call back to scheduled when you find a ride.   Your CT abdominal and Pelvis is scheduled 12/14/2019 at check in at 3:45pm for a 4:00pm scan. Nothing to eat or drink 4 hours before scan  Please pick up contrast before the day before scan. The address of the scan is [address], [place], [place] [address].  Phone number is [phone]  Your Dexa scan is scheduled on  12/09/18 9:00am at The breast center at [facility].

## 2019-12-07 NOTE — Progress Notes (Signed)
Cephas Darby, MD [address]  [place], [place] [address]  Main: [phone]  Fax: [phone]    Gastroenterology Consultation  Referring Provider:     Margo Common, PA Primary Care Physician:  Margo Common, PA Primary Gastroenterologist:  Dr. Cephas Darby Reason for Consultation:     Probable celiac disease        HPI:   Katelyn Bartlett is a 64 y.o. Caucasian female referred by Dr. Margo Common, PA  for consultation & management of chronic diarrhea. She recalls having initial onset of symptoms in 1982, she was told that she had parasite at that time which was treated. Followed by that she had few years of alternating constipation and diarrhea with abdominal cramps and was told that she has irritable bowel syndrome. She underwent a laparoscopic cholecystectomy in 1989 due to symptomatic cholelithiasis. She was asymptomatic for 2 years and then her symptoms recurred. Over the last several years she has been experiencing frequent bowel movements,3-5 times daily associated with abdominal cramps She denies having constipation. She maintained a log of her bowel movements in last 1 week which she shared with me today. Stools are variable in consistency anywhere from loose to soft, semi-formed, nonbloody, sometimes associated with cramps, sometimes postprandial. She reports not losing weight all these years. She is a chronic tobacco smoker, smokes less than a pack a day. She denies nausea, vomiting, abdominal pain. She tried Imodium in the past. She found temporary benefit from probiotics. She has been taking aspirin 325 mg daily for last 1 year or so recommended by her primary care provider. She denies taking naproxen which was listed on her medications. She is waiting to turn 64 so that she gets Medicaid to undergo colonoscopy. She did not have any kind of stool studies performed. She has normal CBC, TSH, CMP, HIV nonreactive  Follow-up visit  10/14/2017 Patient reports that Bentyl is helping with her diarrhea. She takes up to 3 times a day. Workup of her diarrhea revealed that she has elevated celiac serologies, TTG IgA. Her stool studies were negative for infections. Pancreatic elastase came back normal. She decreased aspirin to 81 mg daily after discussing with her primary care doctor. She is learning more about celiac disease from Internet, reading blogs on gluten-free diet. She generally eats homemade food. 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 She denies weight loss, loss of appetite. She denies any other complaints today. She shared her financial constraints which are limiting her to undergo endoscopic evaluation at this time  Follow-up visit 02/20/2018 She is trying to follow a gluten-free diet. Continues to take Bentyl. She brought symptom diary with her today, she does have postprandial nonbloody diarrhea on most of the days. Her weight has been stable.  Follow-up visit 12/07/2019 Patient now has Medicaid since last visit, she wants to discuss about upper endoscopy.  She has ongoing symptoms of postprandial diarrhea, nonbloody,  she denies any weight loss.  Work-up since last visit revealed elevated TTG IgA levels.  Stool studies negative for infection.  Pancreatic fecal elastase levels normal.  She is still skeptical about undergoing colonoscopy due to her father had a complication during this procedure. Patient had a fall recently that resulted in ankle fracture.  She was recommended bone density testing which she did not undergo yet.  She also would like to discuss about the mass that she feels in her upper abdomen which she  noticed about 1 year ago.  She denies any tenderness or increasing in size.  NSAIDs: aspirin 81 mg daily  Antiplts/Anticoagulants/Anti thrombotics: none  GI Procedures: none She is adopted, family history unknown  Past Medical History:  Diagnosis Date  . Abnormal  weight gain 01/16/2015  . Hypercholesteremia   . Tachycardia     Past Surgical History:  Procedure Laterality Date  . ABDOMINAL HYSTERECTOMY  1990  . CHOLECYSTECTOMY  1989  . ROTATOR CUFF REPAIR Right 2007  . SKIN CANCER EXCISION  2011   ABOVE LIP     Current Outpatient Medications:  .  amLODipine (NORVASC) 5 MG tablet, TAKE 1 TABLET BY MOUTH DAILY, Disp: 90 tablet, Rfl: 3 .  aspirin EC 81 MG tablet, Take 81 mg by mouth daily., Disp: , Rfl:  .  atorvastatin (LIPITOR) 40 MG tablet, Take 1 tablet (40 mg total) by mouth daily., Disp: 90 tablet, Rfl: 3 .  dicyclomine (BENTYL) 10 MG capsule, TAKE ONE CAPSULE BY MOUTH TWICE DAILY AS NEEDED, Disp: 180 capsule, Rfl: 0 .  metoprolol tartrate (LOPRESSOR) 25 MG tablet, TAKE 1 TABLET BY MOUTH TWICE DAILY, Disp: 180 tablet, Rfl: 3 .  MULTIPLE VITAMIN PO, Take 1 tablet by mouth daily., Disp: , Rfl:  .  OMEGA-3 FATTY ACIDS PO, Take 1 capsule by mouth daily., Disp: , Rfl:  .  amitriptyline (ELAVIL) 25 MG tablet, Take 1 tablet (25 mg total) by mouth at bedtime., Disp: 30 tablet, Rfl: 0   Family History  Adopted: Yes  Family history unknown: Yes     Social History   Tobacco Use  . Smoking status: Current Every Day Smoker    Packs/day: 0.50    Years: 30.00    Pack years: 15.00    Types: Cigarettes  . Smokeless tobacco: Never Used  Substance Use Topics  . Alcohol use: No    Alcohol/week: 0.0 standard drinks  . Drug use: No    Allergies as of 12/07/2019 - Review Complete 11/18/2019  Allergen Reaction Noted  . Morphine sulfate  01/16/2015    Review of Systems:    All systems reviewed and negative except where noted in HPI.   Physical Exam:  BP 127/76 (BP Location: Left Arm, Patient Position: Sitting, Cuff Size: Normal)   Pulse 83   Temp 98 F (36.7 C) (Oral)   Ht 5' 6"  (1.676 m)   Wt 174 lb (78.9 kg)   BMI 28.08 kg/m  No LMP recorded. Patient has had a hysterectomy.  General:   Alert,  Well-developed, well-nourished,  pleasant and cooperative in NAD Head:  Normocephalic and atraumatic. Eyes:  Sclera clear, no icterus.   Conjunctiva pink. Ears:  Normal auditory acuity. Nose:  No deformity, discharge, or lesions. Mouth:  No deformity or lesions,oropharynx pink & moist. Neck:  Supple; no masses or thyromegaly. Lungs:  Respirations even and unlabored.  Clear throughout to auscultation.   No wheezes, crackles, or rhonchi. No acute distress. Heart:  Regular rate and rhythm; no murmurs, clicks, rubs, or gallops. Abdomen:  Normal bowel sounds. Soft, non-tender, immobile soft tissue collection in the epigastric region, likely collection of fat and non-distended without masses, hepatosplenomegaly or hernias noted.  No guarding or rebound tenderness.   Rectal: Not performed Msk:  Symmetrical without gross deformities. Good, equal movement & strength bilaterally. Pulses:  Normal pulses noted. Extremities:  No clubbing or edema.  No cyanosis. Neurologic:  Alert and oriented x3;  grossly normal neurologically. Skin:  Intact without significant lesions or rashes.  No jaundice. Psych:  Alert and cooperative. Normal mood and affect.  Imaging Studies: reviewed  Assessment and Plan:   Katelyn Bartlett is a 64 y.o. Caucasian female with no significant past medical history, status post laparoscopic cholecystectomy in 1989, presents with more than 20 years history of chronic nonbloody diarrhea with abdominal cramps. Workup revealed positive celiac serologies, she has probable celiac disease. Recommended upper endoscopy to establish a definitive diagnosis by taking duodenal biopsies and to assess the degree of severity of intestinal damage.  I reiterated about colonoscopy with pediatric colonoscope, patient said she will think about it.  I recommend colonoscopy with TI evaluation and biopsies  Perform EGD with gastric and duodenal biopsies Recommend CT abdomen and pelvis with contrast to evaluate for upper abdominal localized  soft tissue collection Recommend bone density testing given history of probable celiac disease and recent ankle fracture, to evaluate for osteopenia or osteoporosis  Follow up in 2 months or sooner if needed   Cephas Darby, MD

## 2019-12-08 ENCOUNTER — Other Ambulatory Visit: Payer: Self-pay

## 2019-12-08 DIAGNOSIS — Z1211 Encounter for screening for malignant neoplasm of colon: Secondary | ICD-10-CM

## 2019-12-08 DIAGNOSIS — K900 Celiac disease: Secondary | ICD-10-CM

## 2019-12-08 MED ORDER — CLENPIQ 10-3.5-12 MG-GM -GM/160ML PO SOLN: 1.0000 | Freq: Once | ORAL | 0 refills | Status: AC

## 2019-12-09 ENCOUNTER — Ambulatory Visit
Admission: RE | Admit: 2019-12-09 | Discharge: 2019-12-09 | Disposition: A | Discharge status: Home / Self Care | Payer: Medicare HMO | Source: Ambulatory Visit | Attending: Gastroenterology | Admitting: Gastroenterology

## 2019-12-09 DIAGNOSIS — M85851 Other specified disorders of bone density and structure, right thigh: Secondary | ICD-10-CM | POA: Diagnosis not present

## 2019-12-09 DIAGNOSIS — Z13820 Encounter for screening for osteoporosis: Secondary | ICD-10-CM | POA: Diagnosis not present

## 2019-12-09 DIAGNOSIS — K900 Celiac disease: Secondary | ICD-10-CM | POA: Diagnosis not present

## 2019-12-09 DIAGNOSIS — Z780 Asymptomatic menopausal state: Secondary | ICD-10-CM | POA: Diagnosis not present

## 2019-12-13 ENCOUNTER — Encounter: Payer: Self-pay | Admitting: Gastroenterology

## 2019-12-14 ENCOUNTER — Ambulatory Visit
Admission: RE | Admit: 2019-12-14 | Discharge: 2019-12-14 | Disposition: A | Discharge status: Home / Self Care | Payer: Medicare HMO | Source: Ambulatory Visit | Attending: Gastroenterology | Admitting: Gastroenterology

## 2019-12-14 ENCOUNTER — Other Ambulatory Visit: Payer: Self-pay

## 2019-12-14 DIAGNOSIS — R1900 Intra-abdominal and pelvic swelling, mass and lump, unspecified site: Secondary | ICD-10-CM

## 2019-12-14 DIAGNOSIS — K5730 Diverticulosis of large intestine without perforation or abscess without bleeding: Secondary | ICD-10-CM | POA: Diagnosis not present

## 2019-12-14 LAB — POCT I-STAT CREATININE: Creatinine, Ser: 0.6 mg/dL (ref 0.44–1.00)

## 2019-12-14 IMAGING — CT CT ABD-PELV W/ CM
2 of 5 series · 16 of 46 positions shown, 18 images · IV contrast (omnipaque)
Comparison: CT of the abdomen and pelvis [DATE].

CLINICAL DATA: 64-year-old female with history of intestinal
malabsorption. Suspected abdominal wall mass. Lump in the mid
abdomen for over 1 year.

EXAM:
CT ABDOMEN AND PELVIS WITH CONTRAST
TECHNIQUE: Multidetector CT imaging of the abdomen and pelvis was performed
using the standard protocol following bolus administration of
intravenous contrast.
CONTRAST:  100mL OMNIPAQUE IOHEXOL 300 MG/ML  SOLN

[Series 2: abd pelvis 5.00 · axial · 0.78mm/px · z∈[-1496,-1086]mm · 13 of 94 slices shown, 15 images]
[im 6/94  soft-tissue]
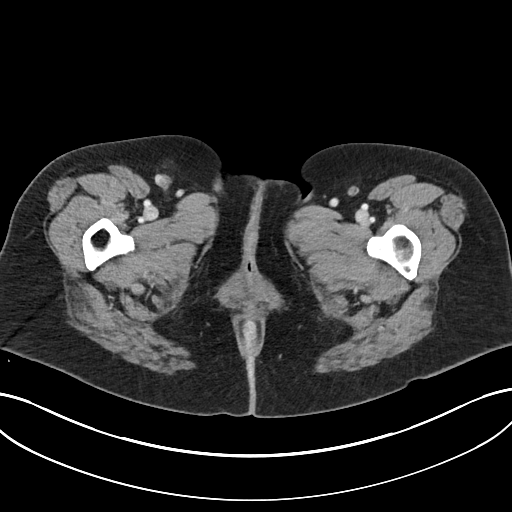
[im 6/94  bone]
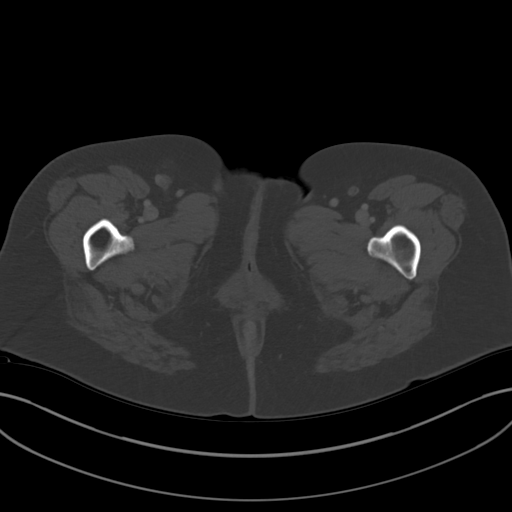
[im 11/94  soft-tissue]
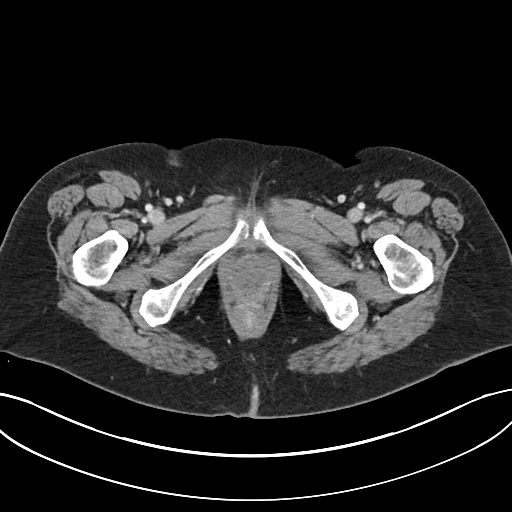
[im 21/94  soft-tissue]
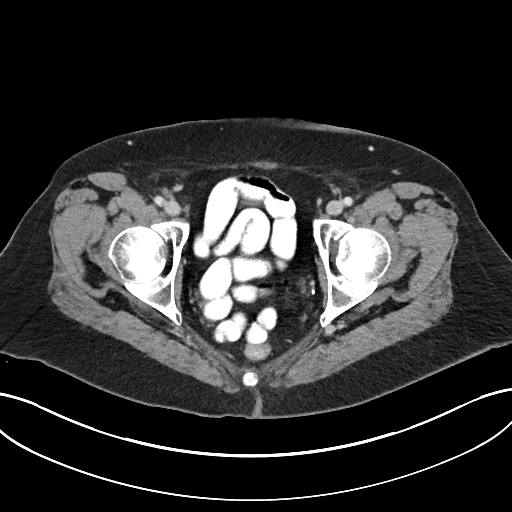
[im 26/94  soft-tissue]
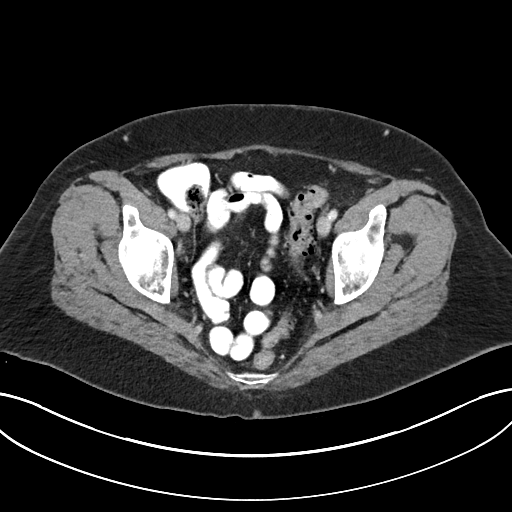
[im 32/94  soft-tissue]
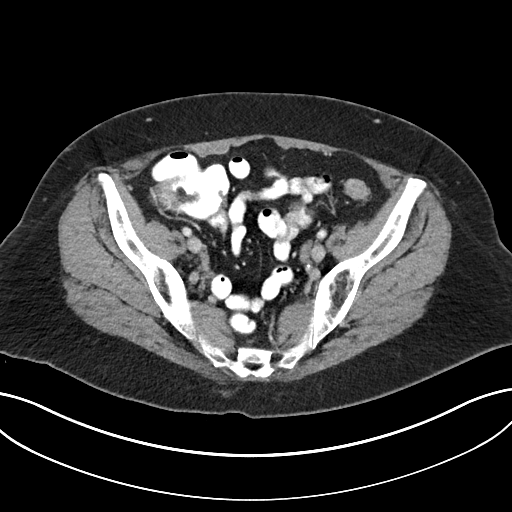
[im 42/94  soft-tissue]
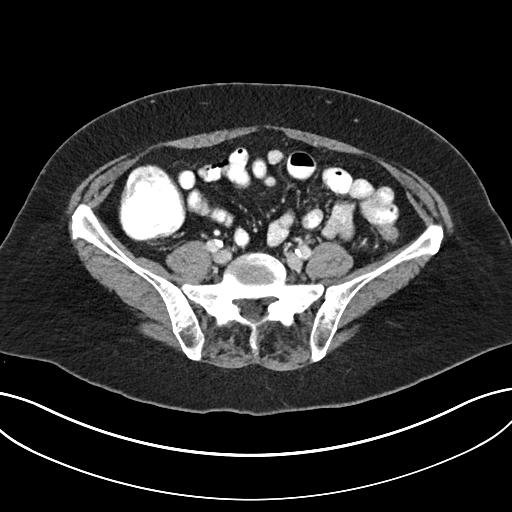
[im 47/94  soft-tissue]
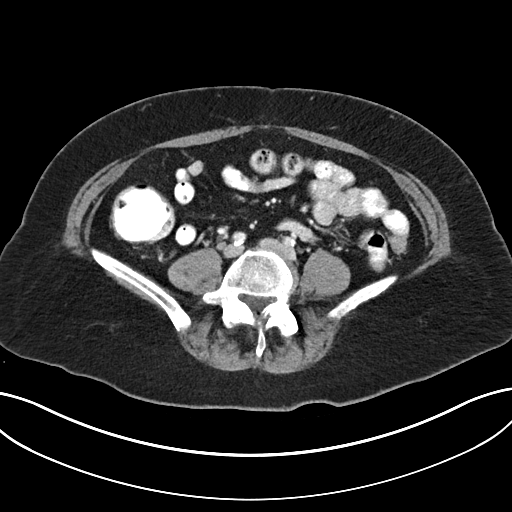
[im 52/94  soft-tissue]
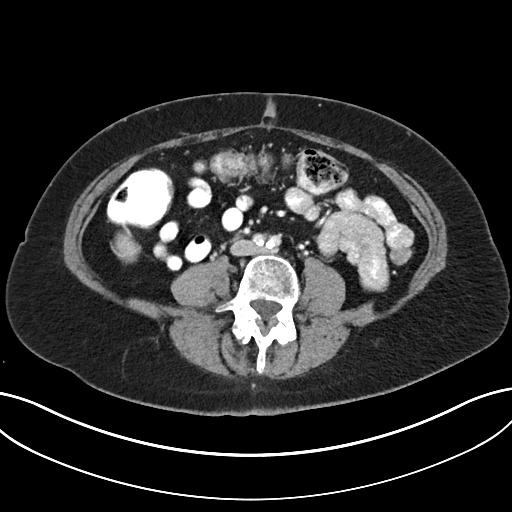
[im 63/94  soft-tissue]
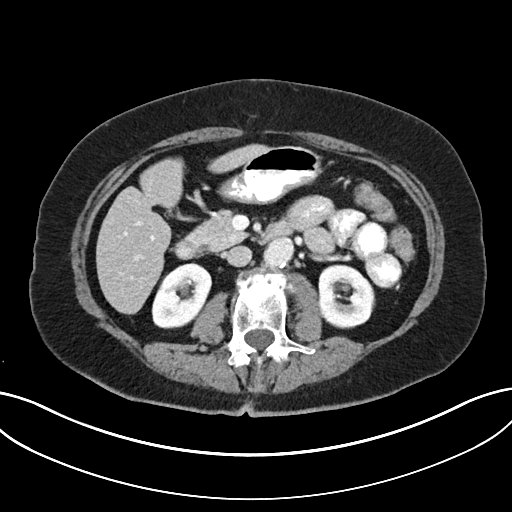
[im 63/94  bone]
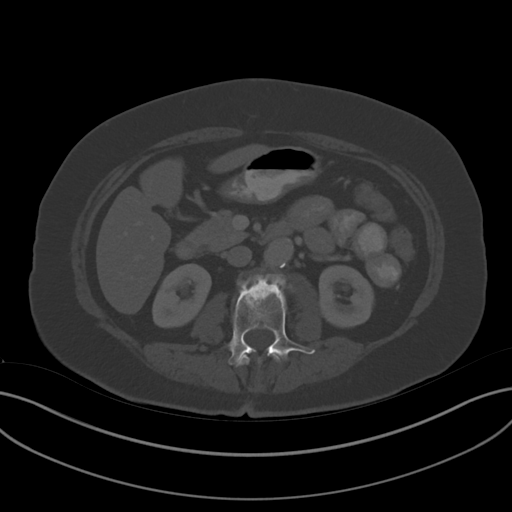
[im 68/94  soft-tissue]
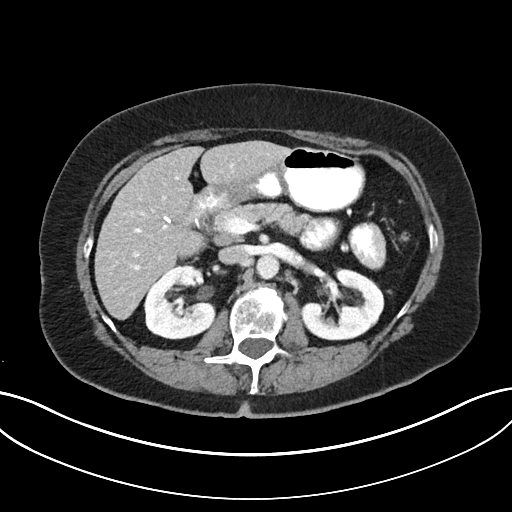
[im 73/94  soft-tissue]
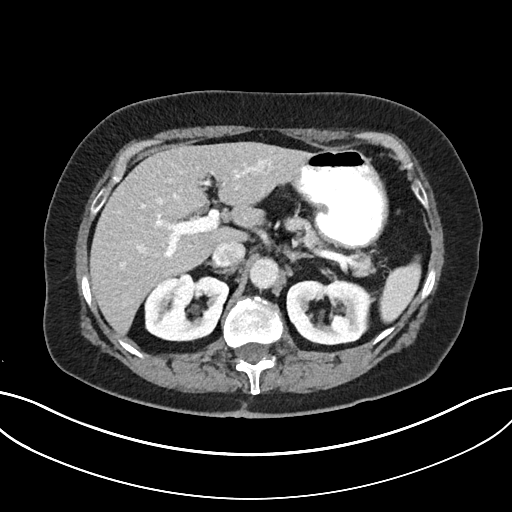
[im 83/94  soft-tissue]
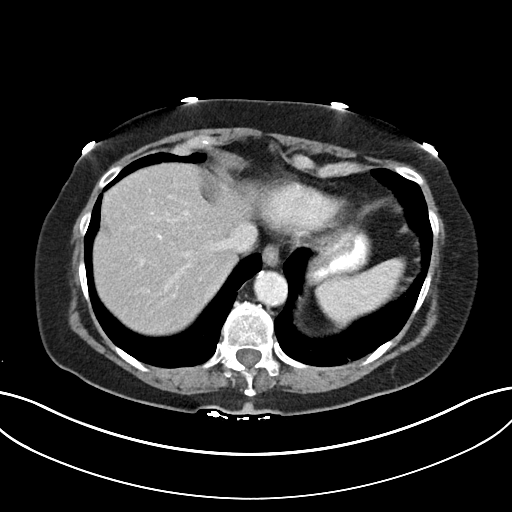
[im 88/94  soft-tissue]
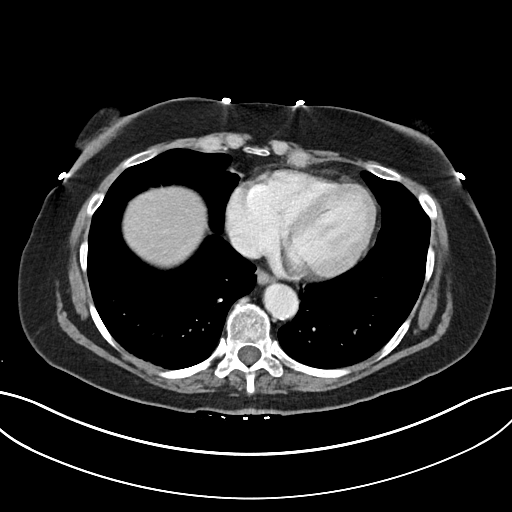

[Series 4: coronals abd pelvis 2.00 cor · coronal · 0.78mm/px · 3 of 135 slices shown]
[im 45/135  soft-tissue]
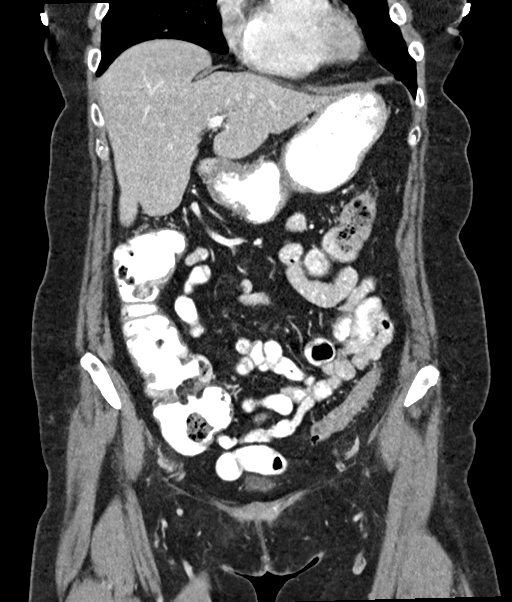
[im 60/135  soft-tissue]
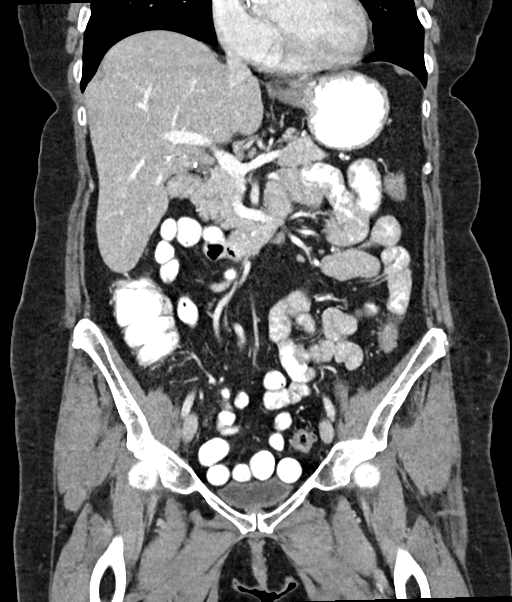
[im 75/135  soft-tissue]
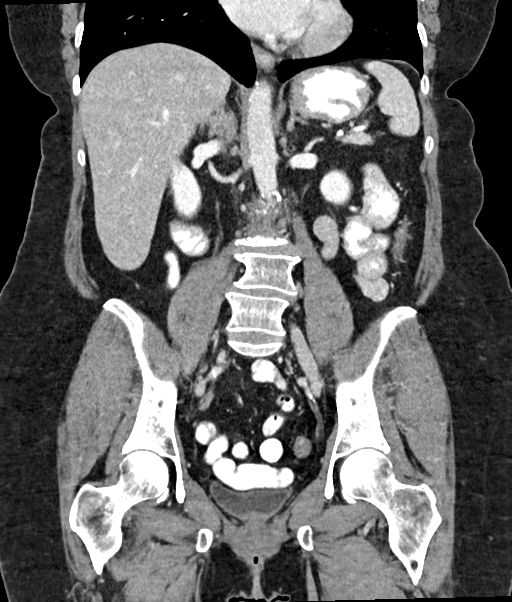

[16 of 46 positions shown; findings below may reference images not displayed]

FINDINGS: Lower chest: Aortic atherosclerosis.

Hepatobiliary: No suspicious cystic or solid hepatic lesions. No
intra or extrahepatic biliary ductal dilatation. Status post
cholecystectomy.

Pancreas: No pancreatic mass. No pancreatic ductal dilatation. No
pancreatic or peripancreatic fluid collections or inflammatory
changes.

Spleen: Unremarkable.

Adrenals/Urinary Tract: Bilateral kidneys and adrenal glands are
normal in appearance. No hydroureteronephrosis. Urinary bladder is
unremarkable in appearance.

Stomach/Bowel: Normal appearance of the stomach. No pathologic
dilatation of small bowel or colon. Numerous colonic diverticulae
are noted, particularly in the sigmoid colon, without surrounding
inflammatory changes to suggest an acute diverticulitis at this
time. The appendix is not confidently identified and may be
surgically absent. Regardless, there are no inflammatory changes
noted adjacent to the cecum to suggest the presence of an acute
appendicitis at this time.

Vascular/Lymphatic: Aortic atherosclerosis with fusiform ectasia of
the infrarenal abdominal aorta measuring up to 2.1 cm in diameter.
No lymphadenopathy noted in the abdomen or pelvis.

Reproductive: Status post hysterectomy. Ovaries are unremarkable in
appearance.

Other: Small epigastric ventral hernia containing only omental fat
(axial image 31 of series 2). No significant volume of ascites. No
pneumoperitoneum.

Musculoskeletal: There are no aggressive appearing lytic or blastic
lesions noted in the visualized portions of the skeleton.
IMPRESSION: 1. No acute findings are noted in the abdomen or pelvis.
2. No abdominal wall mass identified. However there is a small
epigastric ventral hernia containing a small amount of omental fat
which may account for the perceived abnormality on physical
examination.
3. Colonic diverticulosis without evidence of acute diverticulitis
at this time.
4. Additional incidental findings, as above.

## 2019-12-14 MED ORDER — IOHEXOL 300 MG/ML  SOLN
100.0000 mL | Freq: Once | INTRAMUSCULAR | Status: AC | PRN
  Administered 2019-12-14: 100 mL via INTRAVENOUS

## 2019-12-27 ENCOUNTER — Encounter: Payer: Self-pay | Admitting: Gastroenterology

## 2019-12-27 NOTE — Progress Notes (Signed)
Established patient visit  I,Elena D DeSanto,acting as a scribe for [employer], [employer].,have documented all relevant documentation on the behalf of Vernie Murders, PA,as directed by  [employer], [employer] while in the presence of [employer], [place].   Patient: Katelyn Bartlett   DOB: Jan 03, 1956   64 y.o. Female  MRN: [id number] Visit Date: 12/28/2019  Today's healthcare provider: Vernie Murders, PA   Chief Complaint  Patient presents with  . Hypertension  . Hyperlipidemia   Subjective    HPI  Hypertension, follow-up  BP Readings from Last 3 Encounters:  12/28/19 124/78  12/07/19 127/76  11/18/19 114/69   Wt Readings from Last 3 Encounters:  12/28/19 176 lb (79.8 kg)  12/07/19 174 lb (78.9 kg)  11/18/19 174 lb 3.2 oz (79 kg)     She was last seen for hypertension 6 months ago.  Management since that visit includes no changes.  She reports excellent compliance with treatment. She is not having side effects.  She is following a Regular diet. She is not exercising. She does smoke.  Use of agents associated with hypertension: none.   Outside blood pressures are not really checked. Symptoms: No chest pain No chest pressure  No palpitations No syncope  No dyspnea No orthopnea  No paroxysmal nocturnal dyspnea No lower extremity edema   Pertinent labs: Lab Results  Component Value Date   CHOL 182 09/30/2019   HDL 35 (L) 09/30/2019   LDLCALC 109 (H) 09/30/2019   TRIG 217 (H) 09/30/2019   CHOLHDL 5.2 (H) 09/30/2019   Lab Results  Component Value Date   NA 142 09/30/2019   K 4.0 09/30/2019   CREATININE 0.60 12/14/2019   GFRNONAA 94 09/30/2019   GFRAA 109 09/30/2019   GLUCOSE 96 09/30/2019     The 10-year ASCVD risk score Mikey Bussing DC Jr., et al., 2013) is: 14.5%   ---------------------------------------------------------------------------------------------------  Patient is scheduled for colonoscopy and endoscopy. She is still recovering from her  bilateral lower extremity fractures.  She has been unable to exercise for the last 6 weeks.  She plans to see a nutritionist after the colonoscopy if her GI team does not give her any restrictions.  Medications: Outpatient Medications Prior to Visit  Medication Sig  . amLODipine (NORVASC) 5 MG tablet TAKE 1 TABLET BY MOUTH DAILY  . aspirin EC 81 MG tablet Take 81 mg by mouth daily.  Marland Kitchen atorvastatin (LIPITOR) 40 MG tablet Take 1 tablet (40 mg total) by mouth daily.  Marland Kitchen dicyclomine (BENTYL) 10 MG capsule TAKE ONE CAPSULE BY MOUTH TWICE DAILY AS NEEDED  . metoprolol tartrate (LOPRESSOR) 25 MG tablet TAKE 1 TABLET BY MOUTH TWICE DAILY  . MULTIPLE VITAMIN PO Take 1 tablet by mouth daily.  . OMEGA-3 FATTY ACIDS PO Take 1 capsule by mouth daily.  . [DISCONTINUED] amitriptyline (ELAVIL) 25 MG tablet Take 1 tablet (25 mg total) by mouth at bedtime.   No facility-administered medications prior to visit.   Review of Systems See Above    Objective    BP 124/78 (BP Location: Right Arm, Patient Position: Sitting, Cuff Size: Normal)   Pulse 71   Temp (!) 97.1 F (36.2 C) (Skin)   Ht 5' 6"  (1.676 m)   Wt 176 lb (79.8 kg)   SpO2 97%   BMI 28.41 kg/m   Physical Exam Constitutional:      Appearance: Normal appearance.  HENT:     Head: Normocephalic and atraumatic.     Right Ear:  Tympanic membrane, ear canal and external ear normal.     Left Ear: Tympanic membrane, ear canal and external ear normal.     Nose: Nose normal.  Eyes:     Extraocular Movements: Extraocular movements intact.     Conjunctiva/sclera: Conjunctivae normal.     Pupils: Pupils are equal, round, and reactive to light.  Cardiovascular:     Rate and Rhythm: Normal rate and regular rhythm.     Pulses: Normal pulses.     Heart sounds: Normal heart sounds.  Pulmonary:     Effort: Pulmonary effort is normal.     Breath sounds: Normal breath sounds.  Abdominal:     General: Abdomen is flat.     Palpations: Abdomen is soft.       Comments: Patient has history of ventral hernia, normal appearing today.  Feels about 2.5 cm.  Skin:    General: Skin is warm and dry.  Neurological:     General: No focal deficit present.     Mental Status: She is alert and oriented to person, place, and time. Mental status is at baseline.  Psychiatric:        Mood and Affect: Mood normal.        Behavior: Behavior normal.        Thought Content: Thought content normal.        Judgment: Judgment normal.      Depression screen [facility] 2/9 12/28/2019 02/24/2017  Decreased Interest 0 0  Down, Depressed, Hopeless 0 0  PHQ - 2 Score 0 0  Altered sleeping 2 0  Tired, decreased energy 1 0  Change in appetite 1 0  Feeling bad or failure about yourself  0 0  Trouble concentrating 0 0  Moving slowly or fidgety/restless 0 0  Suicidal thoughts 0 0  PHQ-9 Score 4 0  Difficult doing work/chores Not difficult at all -   Fall Risk  12/28/2019 11/18/2019  Falls in the past year? 1 1  Number falls in past yr: 0 0  Injury with Fall? 1 1     Functional Status Survey: Is the patient deaf or have difficulty hearing?: No Does the patient have difficulty seeing, even when wearing glasses/contacts?: No Does the patient have difficulty concentrating, remembering, or making decisions?: No Does the patient have difficulty walking or climbing stairs?: No Does the patient have difficulty dressing or bathing?: No Does the patient have difficulty doing errands alone such as visiting a doctor's office or shopping?: No     Office Visit from 12/28/2019 in [facility]  AUDIT-C Score  1       No results found for any visits on 12/28/19.  Assessment & Plan     Problem List Items Addressed This Visit      Cardiovascular and Mediastinum   Benign hypertension - Primary    Controlled on Amlodipine and Lisinopril.  Continue treatment, no changes in therapy        Other   Combined fat and carbohydrate induced hyperlipemia    Patient was  changed from Pravastatin 0 mg to Atorvastatin after last visit.  Will obtain labs today and adjust accordingly.       Other Visit Diagnoses    BMI 28.0-28.9,adult      Plan dietary and exercise education pending GI work up and labs to evaluate osteopenia.        Osteopenia of right hip Recent fracture of left fibula and right foot from a fall off a curb  6-7 weeks ago. BMD test done with this history and Celiac Disease showed osteopenia in the right hip. Check labs to assess vitamin D and calcium levels. Should stop all smoking.      No follow-ups on file.      Andres Shad, PA, have reviewed all documentation for this visit. The documentation on 12/28/19 for the exam, diagnosis, procedures, and orders are all accurate and complete.    Vernie Murders, [place] [phone] (phone) [phone] (fax)  [place]

## 2019-12-28 ENCOUNTER — Ambulatory Visit (INDEPENDENT_AMBULATORY_CARE_PROVIDER_SITE_OTHER): Payer: Medicare HMO | Admitting: Family Medicine

## 2019-12-28 ENCOUNTER — Other Ambulatory Visit: Payer: Self-pay

## 2019-12-28 ENCOUNTER — Encounter: Payer: Self-pay | Admitting: Family Medicine

## 2019-12-28 VITALS — BP 124/78 | HR 71 | Temp 97.1°F | Ht 66.0 in | Wt 176.0 lb

## 2019-12-28 DIAGNOSIS — Z6828 Body mass index (BMI) 28.0-28.9, adult: Secondary | ICD-10-CM | POA: Diagnosis not present

## 2019-12-28 DIAGNOSIS — I10 Essential (primary) hypertension: Secondary | ICD-10-CM | POA: Diagnosis not present

## 2019-12-28 DIAGNOSIS — M85851 Other specified disorders of bone density and structure, right thigh: Secondary | ICD-10-CM

## 2019-12-28 DIAGNOSIS — E782 Mixed hyperlipidemia: Secondary | ICD-10-CM

## 2019-12-28 DIAGNOSIS — M85852 Other specified disorders of bone density and structure, left thigh: Secondary | ICD-10-CM | POA: Diagnosis not present

## 2019-12-28 MED ORDER — ATORVASTATIN CALCIUM 40 MG PO TABS: 40.0000 mg | ORAL_TABLET | Freq: Every day | ORAL | 3 refills | Status: DC

## 2019-12-28 MED ORDER — METOPROLOL TARTRATE 25 MG PO TABS: 25.0000 mg | ORAL_TABLET | Freq: Two times a day (BID) | ORAL | 3 refills | Status: DC

## 2019-12-28 MED ORDER — AMLODIPINE BESYLATE 5 MG PO TABS: 5.0000 mg | ORAL_TABLET | Freq: Every day | ORAL | 3 refills | Status: DC

## 2019-12-28 NOTE — Patient Instructions (Signed)
Osteopenia  Osteopenia is a loss of thickness (density) inside of the bones. Another name for osteopenia is low bone mass. Mild osteopenia is a normal part of aging. It is not a disease, and it does not cause symptoms. However, if you have osteopenia and continue to lose bone mass, you could develop a condition that causes the bones to become thin and break more easily (osteoporosis). You may also lose some height, have back pain, and have a stooped posture. Although osteopenia is not a disease, making changes to your lifestyle and diet can help to prevent osteopenia from developing into osteoporosis. What are the causes? Osteopenia is caused by loss of calcium in the bones.  Bones are constantly changing. Old bone cells are continually being replaced with new bone cells. This process builds new bone. The mineral calcium is needed to build new bone and maintain bone density. Bone density is usually highest around age 35. After that, most people's bodies cannot replace all the bone they have lost with new bone. What increases the risk? You are more likely to develop this condition if:  You are older than age 50.  You are a woman who went through menopause early.  You have a long illness that keeps you in bed.  You do not get enough exercise.  You lack certain nutrients (malnutrition).  You have an overactive thyroid gland (hyperthyroidism).  You smoke.  You drink a lot of alcohol.  You are taking medicines that weaken the bones, such as steroids. What are the signs or symptoms? This condition does not cause any symptoms. You may have a slightly higher risk for bone breaks (fractures), so getting fractures more easily than normal may be an indication of osteopenia. How is this diagnosed? Your health care provider can diagnose this condition with a special type of X-ray exam that measures bone density (dual-energy X-ray absorptiometry, DEXA). This test can measure bone density in your  hips, spine, and wrists. Osteopenia has no symptoms, so this condition is usually diagnosed after a routine bone density screening test is done for osteoporosis. This routine screening is usually done for:  Women who are age 65 or older.  Men who are age 70 or older. If you have risk factors for osteopenia, you may have the screening test at an earlier age. How is this treated? Making dietary and lifestyle changes can lower your risk for osteoporosis. If you have severe osteopenia that is close to becoming osteoporosis, your health care provider may prescribe medicines and dietary supplements such as calcium and vitamin D. These supplements help to rebuild bone density. Follow these instructions at home:   Take over-the-counter and prescription medicines only as told by your health care provider. These include vitamins and supplements.  Eat a diet that is high in calcium and vitamin D. ? Calcium is found in dairy products, beans, salmon, and leafy green vegetables like spinach and broccoli. ? Look for foods that have vitamin D and calcium added to them (fortified foods), such as orange juice, cereal, and bread.  Do 30 or more minutes of a weight-bearing exercise every day, such as walking, jogging, or playing a sport. These types of exercises strengthen the bones.  Take precautions at home to lower your risk of falling, such as: ? Keeping rooms well-lit and free of clutter, such as cords. ? Installing safety rails on stairs. ? Using rubber mats in the bathroom or other areas that are often wet or slippery.  Do not use   any products that contain nicotine or tobacco, such as cigarettes and e-cigarettes. If you need help quitting, ask your health care provider.  Avoid alcohol or limit alcohol intake to no more than 1 drink a day for nonpregnant women and 2 drinks a day for men. One drink equals 12 oz of beer, 5 oz of wine, or 1 oz of hard liquor.  Keep all follow-up visits as told by your  health care provider. This is important. Contact a health care provider if:  You have not had a bone density screening for osteoporosis and you are: ? A woman, age 65 or older. ? A man, age 70 or older.  You are a postmenopausal woman who has not had a bone density screening for osteoporosis.  You are older than age 50 and you want to know if you should have bone density screening for osteoporosis. Summary  Osteopenia is a loss of thickness (density) inside of the bones. Another name for osteopenia is low bone mass.  Osteopenia is not a disease, but it may increase your risk for a condition that causes the bones to become thin and break more easily (osteoporosis).  You may be at risk for osteopenia if you are older than age 50 or if you are a woman who went through early menopause.  Osteopenia does not cause any symptoms, but it can be diagnosed with a bone density screening test.  Dietary and lifestyle changes are the first treatment for osteopenia. These may lower your risk for osteoporosis. This information is not intended to replace advice given to you by your health care provider. Make sure you discuss any questions you have with your health care provider. Document Revised: 06/20/2017 Document Reviewed: 04/16/2017 Elsevier Patient Education  2020 Elsevier Inc.  

## 2019-12-28 NOTE — Assessment & Plan Note (Signed)
Patient was changed from Pravastatin 0 mg to Atorvastatin after last visit.  Will obtain labs today and adjust accordingly.

## 2019-12-28 NOTE — Assessment & Plan Note (Signed)
Controlled on Amlodipine and Lisinopril.  Continue treatment, no changes in therapy

## 2019-12-29 ENCOUNTER — Other Ambulatory Visit: Payer: Self-pay

## 2019-12-29 ENCOUNTER — Encounter: Payer: Self-pay | Admitting: Family Medicine

## 2019-12-29 ENCOUNTER — Encounter: Payer: Self-pay | Admitting: Gastroenterology

## 2019-12-29 DIAGNOSIS — K580 Irritable bowel syndrome with diarrhea: Secondary | ICD-10-CM

## 2019-12-29 LAB — COMPREHENSIVE METABOLIC PANEL
ALT: 31 IU/L (ref 0–32)
AST: 27 IU/L (ref 0–40)
Albumin/Globulin Ratio: 1.9 (ref 1.2–2.2)
Albumin: 4.6 g/dL (ref 3.8–4.8)
Alkaline Phosphatase: 102 IU/L (ref 48–121)
BUN/Creatinine Ratio: 19 (ref 12–28)
BUN: 12 mg/dL (ref 8–27)
Bilirubin Total: 0.5 mg/dL (ref 0.0–1.2)
CO2: 21 mmol/L (ref 20–29)
Calcium: 9.2 mg/dL (ref 8.7–10.3)
Chloride: 104 mmol/L (ref 96–106)
Creatinine, Ser: 0.64 mg/dL (ref 0.57–1.00)
GFR calc Af Amer: 108 mL/min/{1.73_m2} (ref >59)
GFR calc non Af Amer: 94 mL/min/{1.73_m2} (ref >59)
Globulin, Total: 2.4 g/dL (ref 1.5–4.5)
Glucose: 106 mg/dL — ABNORMAL HIGH (ref 65–99)
Potassium: 4.1 mmol/L (ref 3.5–5.2)
Sodium: 140 mmol/L (ref 134–144)
Total Protein: 7 g/dL (ref 6.0–8.5)

## 2019-12-29 LAB — LIPID PANEL WITH LDL/HDL RATIO
Cholesterol, Total: 136 mg/dL (ref 100–199)
HDL: 45 mg/dL (ref >39)
LDL Chol Calc (NIH): 70 mg/dL (ref 0–99)
LDL/HDL Ratio: 1.6 ratio (ref 0.0–3.2)
Triglycerides: 116 mg/dL (ref 0–149)
VLDL Cholesterol Cal: 21 mg/dL (ref 5–40)

## 2019-12-29 LAB — VITAMIN D 25 HYDROXY (VIT D DEFICIENCY, FRACTURES): Vit D, 25-Hydroxy: 23.7 ng/mL — ABNORMAL LOW (ref 30.0–100.0)

## 2019-12-30 MED ORDER — DICYCLOMINE HCL 10 MG PO CAPS: 10.0000 mg | ORAL_CAPSULE | Freq: Two times a day (BID) | ORAL | 0 refills | Status: DC | PRN

## 2020-01-10 ENCOUNTER — Other Ambulatory Visit: Payer: Self-pay

## 2020-01-10 ENCOUNTER — Other Ambulatory Visit
Admission: RE | Admit: 2020-01-10 | Discharge: 2020-01-10 | Disposition: A | Discharge status: Home / Self Care | Payer: Medicare HMO | Source: Ambulatory Visit | Attending: Gastroenterology | Admitting: Gastroenterology

## 2020-01-10 DIAGNOSIS — Z01812 Encounter for preprocedural laboratory examination: Secondary | ICD-10-CM | POA: Diagnosis not present

## 2020-01-10 DIAGNOSIS — Z20822 Contact with and (suspected) exposure to covid-19: Secondary | ICD-10-CM | POA: Diagnosis not present

## 2020-01-11 ENCOUNTER — Telehealth: Payer: Self-pay

## 2020-01-11 LAB — SARS CORONAVIRUS 2 (TAT 6-24 HRS): SARS Coronavirus 2: NEGATIVE

## 2020-01-11 NOTE — Telephone Encounter (Signed)
Patient has been informed that she does have to take her Sutabs at 5pm, and the second dose 5 hours before procedure time.  Nothing to eat or drink after midnight.  Her friend can drop her off and the nurse will call her when she is ready to be picked up after the procedure.  Thanks,  [place], [place]

## 2020-01-12 ENCOUNTER — Encounter
Admission: RE | Disposition: A | Discharge status: Home / Self Care | Payer: Self-pay | Source: Home / Self Care | Attending: Gastroenterology

## 2020-01-12 ENCOUNTER — Ambulatory Visit: Payer: Medicare HMO | Admitting: Anesthesiology

## 2020-01-12 ENCOUNTER — Encounter: Payer: Self-pay | Admitting: Gastroenterology

## 2020-01-12 ENCOUNTER — Other Ambulatory Visit: Payer: Self-pay

## 2020-01-12 ENCOUNTER — Ambulatory Visit
Admission: RE | Admit: 2020-01-12 | Discharge: 2020-01-12 | Disposition: A | Discharge status: Home / Self Care | Payer: Medicare HMO | Attending: Gastroenterology | Admitting: Gastroenterology

## 2020-01-12 DIAGNOSIS — K922 Gastrointestinal hemorrhage, unspecified: Secondary | ICD-10-CM | POA: Diagnosis not present

## 2020-01-12 DIAGNOSIS — K621 Rectal polyp: Secondary | ICD-10-CM | POA: Diagnosis not present

## 2020-01-12 DIAGNOSIS — K900 Celiac disease: Secondary | ICD-10-CM

## 2020-01-12 DIAGNOSIS — Z85828 Personal history of other malignant neoplasm of skin: Secondary | ICD-10-CM | POA: Diagnosis not present

## 2020-01-12 DIAGNOSIS — K3189 Other diseases of stomach and duodenum: Secondary | ICD-10-CM | POA: Diagnosis not present

## 2020-01-12 DIAGNOSIS — K5730 Diverticulosis of large intestine without perforation or abscess without bleeding: Secondary | ICD-10-CM

## 2020-01-12 DIAGNOSIS — Z1211 Encounter for screening for malignant neoplasm of colon: Secondary | ICD-10-CM | POA: Diagnosis not present

## 2020-01-12 DIAGNOSIS — Z79899 Other long term (current) drug therapy: Secondary | ICD-10-CM | POA: Insufficient documentation

## 2020-01-12 DIAGNOSIS — K259 Gastric ulcer, unspecified as acute or chronic, without hemorrhage or perforation: Secondary | ICD-10-CM | POA: Diagnosis not present

## 2020-01-12 DIAGNOSIS — R768 Other specified abnormal immunological findings in serum: Secondary | ICD-10-CM | POA: Diagnosis not present

## 2020-01-12 DIAGNOSIS — Z7689 Persons encountering health services in other specified circumstances: Secondary | ICD-10-CM | POA: Diagnosis not present

## 2020-01-12 DIAGNOSIS — Z90710 Acquired absence of both cervix and uterus: Secondary | ICD-10-CM | POA: Insufficient documentation

## 2020-01-12 DIAGNOSIS — K449 Diaphragmatic hernia without obstruction or gangrene: Secondary | ICD-10-CM | POA: Diagnosis not present

## 2020-01-12 DIAGNOSIS — K2100 Gastro-esophageal reflux disease with esophagitis, without bleeding: Secondary | ICD-10-CM | POA: Diagnosis not present

## 2020-01-12 DIAGNOSIS — I472 Ventricular tachycardia: Secondary | ICD-10-CM | POA: Insufficient documentation

## 2020-01-12 DIAGNOSIS — Z7982 Long term (current) use of aspirin: Secondary | ICD-10-CM | POA: Insufficient documentation

## 2020-01-12 DIAGNOSIS — I10 Essential (primary) hypertension: Secondary | ICD-10-CM | POA: Diagnosis not present

## 2020-01-12 DIAGNOSIS — Z885 Allergy status to narcotic agent status: Secondary | ICD-10-CM | POA: Diagnosis not present

## 2020-01-12 DIAGNOSIS — F17210 Nicotine dependence, cigarettes, uncomplicated: Secondary | ICD-10-CM | POA: Diagnosis not present

## 2020-01-12 DIAGNOSIS — Z9049 Acquired absence of other specified parts of digestive tract: Secondary | ICD-10-CM | POA: Diagnosis not present

## 2020-01-12 DIAGNOSIS — E7800 Pure hypercholesterolemia, unspecified: Secondary | ICD-10-CM | POA: Insufficient documentation

## 2020-01-12 DIAGNOSIS — K2210 Ulcer of esophagus without bleeding: Secondary | ICD-10-CM | POA: Diagnosis not present

## 2020-01-12 DIAGNOSIS — R69 Illness, unspecified: Secondary | ICD-10-CM | POA: Diagnosis not present

## 2020-01-12 HISTORY — PX: COLONOSCOPY WITH PROPOFOL: SHX5780

## 2020-01-12 HISTORY — PX: ESOPHAGOGASTRODUODENOSCOPY (EGD) WITH PROPOFOL: SHX5813

## 2020-01-12 SURGERY — COLONOSCOPY WITH PROPOFOL
Anesthesia: General

## 2020-01-12 MED ORDER — PHENYLEPHRINE HCL (PRESSORS) 10 MG/ML IV SOLN: INTRAVENOUS | Status: DC | PRN

## 2020-01-12 MED ORDER — PROPOFOL 10 MG/ML IV BOLUS
INTRAVENOUS | Status: DC | PRN
  Administered 2020-01-12: 50 mg via INTRAVENOUS
  Administered 2020-01-12: 15 mg via INTRAVENOUS

## 2020-01-12 MED ORDER — OMEPRAZOLE 40 MG PO CPDR: 40.0000 mg | DELAYED_RELEASE_CAPSULE | Freq: Every day | ORAL | 2 refills | Status: DC

## 2020-01-12 MED ORDER — LIDOCAINE HCL (CARDIAC) PF 100 MG/5ML IV SOSY
PREFILLED_SYRINGE | INTRAVENOUS | Status: DC | PRN
  Administered 2020-01-12: 100 mg via INTRAVENOUS

## 2020-01-12 MED ORDER — SODIUM CHLORIDE 0.9 % IV SOLN
INTRAVENOUS | Status: DC
  Administered 2020-01-12: 20 mL/h via INTRAVENOUS

## 2020-01-12 MED ORDER — PROPOFOL 500 MG/50ML IV EMUL
INTRAVENOUS | Status: DC | PRN
  Administered 2020-01-12: 155 ug/kg/min via INTRAVENOUS

## 2020-01-12 MED ORDER — PHENYLEPHRINE HCL (PRESSORS) 10 MG/ML IV SOLN
INTRAVENOUS | Status: DC | PRN
  Administered 2020-01-12 (×3): 100 ug via INTRAVENOUS

## 2020-01-12 MED ORDER — EPHEDRINE SULFATE 50 MG/ML IJ SOLN
INTRAMUSCULAR | Status: DC | PRN
Start: 2020-01-12 — End: 2020-01-12
  Administered 2020-01-12: 5 mg via INTRAVENOUS
  Administered 2020-01-12: 10 mg via INTRAVENOUS

## 2020-01-12 NOTE — Transfer of Care (Signed)
Immediate Anesthesia Transfer of Care Note  Patient: GRACELYN COVENTRY  Procedure(s) Performed: COLONOSCOPY WITH PROPOFOL (N/A ) ESOPHAGOGASTRODUODENOSCOPY (EGD) WITH PROPOFOL (N/A )  Patient Location: Endoscopy Unit  Anesthesia Type:General  Level of Consciousness: awake, drowsy and patient cooperative  Airway & Oxygen Therapy: Patient Spontanous Breathing and Patient connected to face mask oxygen  Post-op Assessment: Report given to RN and Post -op Vital signs reviewed and stable  Post vital signs: Reviewed and stable  Last Vitals:  Vitals Value Taken Time  BP 99/61 01/12/20 0920  Temp 36.7 C 01/12/20 0918  Pulse 80 01/12/20 0922  Resp 23 01/12/20 0922  SpO2 97 % 01/12/20 0922  Vitals shown include unvalidated device data.  Last Pain:  Vitals:   01/12/20 0921  TempSrc:   PainSc: 0-No pain         Complications: No complications documented.

## 2020-01-12 NOTE — Anesthesia Postprocedure Evaluation (Signed)
Anesthesia Post Note  Patient: GERALD HONEA  Procedure(s) Performed: COLONOSCOPY WITH PROPOFOL (N/A ) ESOPHAGOGASTRODUODENOSCOPY (EGD) WITH PROPOFOL (N/A )  Patient location during evaluation: Endoscopy Anesthesia Type: General Level of consciousness: awake and alert and oriented Pain management: pain level controlled Vital Signs Assessment: post-procedure vital signs reviewed and stable Respiratory status: spontaneous breathing Cardiovascular status: blood pressure returned to baseline Anesthetic complications: no   No complications documented.   Last Vitals:  Vitals:   01/12/20 0928 01/12/20 0948  BP: 103/67 116/69  Pulse:    Resp:    Temp:    SpO2:      Last Pain:  Vitals:   01/12/20 0948  TempSrc:   PainSc: 0-No pain                 Ammarie Matsuura

## 2020-01-12 NOTE — Anesthesia Preprocedure Evaluation (Signed)
Anesthesia Evaluation  Patient identified by MRN, date of birth, ID band Patient awake    Reviewed: Allergy & Precautions, NPO status , Patient's Chart, lab work & pertinent test results, reviewed documented beta blocker date and time   Airway Mallampati: III       Dental   Pulmonary Current Smoker and Patient abstained from smoking.,           Cardiovascular hypertension, Pt. on medications and Pt. on home beta blockers + dysrhythmias Supra Ventricular Tachycardia      Neuro/Psych negative neurological ROS  negative psych ROS   GI/Hepatic negative GI ROS, Neg liver ROS,   Endo/Other  negative endocrine ROS  Renal/GU negative Renal ROS  negative genitourinary   Musculoskeletal negative musculoskeletal ROS (+)   Abdominal   Peds negative pediatric ROS (+)  Hematology negative hematology ROS (+)   Anesthesia Other Findings   Reproductive/Obstetrics                             Anesthesia Physical Anesthesia Plan  ASA: II  Anesthesia Plan: General   Post-op Pain Management:    Induction: Intravenous  PONV Risk Score and Plan: Propofol infusion  Airway Management Planned: Nasal Cannula  Additional Equipment:   Intra-op Plan:   Post-operative Plan:   Informed Consent: I have reviewed the patients History and Physical, chart, labs and discussed the procedure including the risks, benefits and alternatives for the proposed anesthesia with the patient or authorized representative who has indicated his/her understanding and acceptance.     Dental advisory given  Plan Discussed with: CRNA and Surgeon  Anesthesia Plan Comments:         Anesthesia Quick Evaluation

## 2020-01-12 NOTE — Op Note (Signed)
[facility] Gastroenterology Patient Name: Katelyn Bartlett Procedure Date: 01/12/2020 8:34 AM MRN: [id number] Account #: [id number] Date of Birth: Sep 04, 1955 Admit Type: Outpatient Age: 64 Room: [facility] ENDO ROOM 4 Gender: Female Note Status: Finalized Procedure:             Upper GI endoscopy Indications:           Positive celiac serologies, Diarrhea Providers:             Lin Landsman MD, MD Medicines:             Monitored Anesthesia Care Complications:         No immediate complications. Estimated blood loss: None. Procedure:             Pre-Anesthesia Assessment:                        - Prior to the procedure, a History and Physical was                         performed, and patient medications and allergies were                         reviewed. The patient is competent. The risks and                         benefits of the procedure and the sedation options and                         risks were discussed with the patient. All questions                         were answered and informed consent was obtained.                         Patient identification and proposed procedure were                         verified by the physician, the nurse, the                         anesthesiologist, the anesthetist and the technician                         in the pre-procedure area in the procedure room in the                         endoscopy suite. Mental Status Examination: alert and                         oriented. Airway Examination: normal oropharyngeal                         airway and neck mobility. Respiratory Examination:                         clear to auscultation. CV Examination: normal.                         Prophylactic Antibiotics: The patient does  not require                         prophylactic antibiotics. Prior Anticoagulants: The                         patient has taken no previous anticoagulant or                         antiplatelet  agents. ASA Grade Assessment: III - A                         patient with severe systemic disease. After reviewing                         the risks and benefits, the patient was deemed in                         satisfactory condition to undergo the procedure. The                         anesthesia plan was to use monitored anesthesia care                         (MAC). Immediately prior to administration of                         medications, the patient was re-assessed for adequacy                         to receive sedatives. The heart rate, respiratory                         rate, oxygen saturations, blood pressure, adequacy of                         pulmonary ventilation, and response to care were                         monitored throughout the procedure. The physical                         status of the patient was re-assessed after the                         procedure.                        After obtaining informed consent, the endoscope was                         passed under direct vision. Throughout the procedure,                         the patient's blood pressure, pulse, and oxygen                         saturations were monitored continuously. The Endoscope  was introduced through the mouth, and advanced to the                         second part of duodenum. The upper GI endoscopy was                         accomplished without difficulty. The patient tolerated                         the procedure fairly well. Findings:      The duodenal bulb and second portion of the duodenum were normal.       Biopsies for histology were taken with a cold forceps for evaluation of       celiac disease.      Diffuse severely erythematous mucosa without bleeding was found in the       entire examined stomach. Biopsies were taken with a cold forceps for       Helicobacter pylori testing.      A few dispersed small erosions with stigmata of recent bleeding  were       found in the gastric body.      The cardia and gastric fundus were normal on retroflexion.      A small hiatal hernia was present.      Esophagogastric landmarks were identified: the gastroesophageal junction       was found at 35 cm from the incisors.      LA Grade A (one or more mucosal breaks less than 5 mm, not extending       between tops of 2 mucosal folds) esophagitis with no bleeding was found       in the lower third of the esophagus. Impression:            - Normal duodenal bulb and second portion of the                         duodenum. Biopsied.                        - Erythematous mucosa in the stomach. Biopsied.                        - Erosive gastropathy with stigmata of recent bleeding.                        - Small hiatal hernia.                        - Esophagogastric landmarks identified.                        - LA Grade A reflux esophagitis with no bleeding. Recommendation:        - Await pathology results.                        - Use Prilosec (omeprazole) 40 mg PO BID for 3 months.                        - Follow an antireflux regimen.                        -  Proceed with colonoscopy as scheduled                        See colonoscopy report Procedure Code(s):     --- Professional ---                        [phone], Esophagogastroduodenoscopy, flexible,                         transoral; with biopsy, single or multiple Diagnosis Code(s):     --- Professional ---                        K31.89, Other diseases of stomach and duodenum                        K92.2, Gastrointestinal hemorrhage, unspecified                        K21.00, Gastro-esophageal reflux disease with                         esophagitis, without bleeding                        K44.9, Diaphragmatic hernia without obstruction or                         gangrene                        R76.8, Other specified abnormal immunological findings                         in serum                         R19.7, Diarrhea, unspecified CPT copyright 2019 American Medical Association. All rights reserved. The codes documented in this report are preliminary and upon coder review may  be revised to meet current compliance requirements. Dr. Ulyess Mort Lin Landsman MD, MD 01/12/2020 8:58:15 AM This report has been signed electronically. Number of Addenda: 0 Note Initiated On: 01/12/2020 8:34 AM Estimated Blood Loss:  Estimated blood loss: none.      [facility]

## 2020-01-12 NOTE — Anesthesia Procedure Notes (Signed)
Procedure Name: General with mask airway Performed by: Kelton Pillar, CRNA Pre-anesthesia Checklist: Patient identified, Emergency Drugs available, Suction available and Patient being monitored Patient Re-evaluated:Patient Re-evaluated prior to induction Oxygen Delivery Method: Simple face mask Induction Type: IV induction Ventilation: Nasal airway inserted- appropriate to patient size Placement Confirmation: positive ETCO2 and CO2 detector Dental Injury: Teeth and Oropharynx as per pre-operative assessment

## 2020-01-12 NOTE — H&P (Addendum)
Cephas Darby, MD [address]  [place]  [place], [place] [address]  Main: [phone]  Fax: [phone] Pager: [phone]  Primary Care Physician:  Margo Common, PA Primary Gastroenterologist:  Dr. Cephas Darby  Pre-Procedure History & Physical: HPI:  Katelyn Bartlett is a 64 y.o. female is here for an endoscopy and colonoscopy.   Past Medical History:  Diagnosis Date  . Abnormal weight gain 01/16/2015  . Hypercholesteremia   . Tachycardia     Past Surgical History:  Procedure Laterality Date  . ABDOMINAL HYSTERECTOMY  1990  . CHOLECYSTECTOMY  1989  . ROTATOR CUFF REPAIR Right 2007  . SKIN CANCER EXCISION  2011   ABOVE LIP    Prior to Admission medications   Medication Sig Start Date End Date Taking? Authorizing Provider  amLODipine (NORVASC) 5 MG tablet Take 1 tablet (5 mg total) by mouth daily. 12/28/19  Yes Chrismon, Vickki Muff, PA  aspirin EC 81 MG tablet Take 81 mg by mouth daily.   Yes [provider]  atorvastatin (LIPITOR) 40 MG tablet Take 1 tablet (40 mg total) by mouth daily. 12/28/19  Yes Chrismon, Vickki Muff, PA  dicyclomine (BENTYL) 10 MG capsule Take 1 capsule (10 mg total) by mouth 2 (two) times daily as needed. 12/30/19  Yes Chrismon, Vickki Muff, PA  metoprolol tartrate (LOPRESSOR) 25 MG tablet Take 1 tablet (25 mg total) by mouth 2 (two) times daily. 12/28/19  Yes Chrismon, Vickki Muff, PA  MULTIPLE VITAMIN PO Take 1 tablet by mouth daily.   Yes [provider]  OMEGA-3 FATTY ACIDS PO Take 1 capsule by mouth daily.   Yes [provider]    Allergies as of 12/08/2019 - Review Complete 11/18/2019  Allergen Reaction Noted  . Morphine sulfate  01/16/2015    Family History  Adopted: Yes  Family history unknown: Yes    Social History   Socioeconomic History  . Marital status: Widowed    Spouse name: Not on file  . Number of children: Not on file  . Years of education: Not on file  . Highest education level: Not on  file  Occupational History  . Not on file  Tobacco Use  . Smoking status: Current Every Day Smoker    Packs/day: 0.50    Years: 30.00    Pack years: 15.00    Types: Cigarettes  . Smokeless tobacco: Never Used  Substance and Sexual Activity  . Alcohol use: No    Alcohol/week: 0.0 standard drinks  . Drug use: No  . Sexual activity: Not on file  Other Topics Concern  . Not on file  Social History Narrative  . Not on file   Social Determinants of Health   Financial Resource Strain:   . Difficulty of Paying Living Expenses:   Food Insecurity:   . Worried About Charity fundraiser in the Last Year:   . Arboriculturist in the Last Year:   Transportation Needs:   . Film/video editor (Medical):   Marland Kitchen Lack of Transportation (Non-Medical):   Physical Activity:   . Days of Exercise per Week:   . Minutes of Exercise per Session:   Stress:   . Feeling of Stress :   Social Connections:   . Frequency of Communication with Friends and Family:   . Frequency of Social Gatherings with Friends and Family:   . Attends Religious Services:   . Active Member of Clubs or Organizations:   .  Attends Archivist Meetings:   Marland Kitchen Marital Status:   Intimate Partner Violence:   . Fear of Current or Ex-Partner:   . Emotionally Abused:   Marland Kitchen Physically Abused:   . Sexually Abused:     Review of Systems: See HPI, otherwise negative ROS  Physical Exam: BP 138/67   Pulse 97   Temp 99.2 F (37.3 C) (Temporal)   Resp 20   Ht 5' 6"  (1.676 m)   Wt 78.9 kg   SpO2 96%   BMI 28.08 kg/m  General:   Alert,  pleasant and cooperative in NAD Head:  Normocephalic and atraumatic. Neck:  Supple; no masses or thyromegaly. Lungs:  Clear throughout to auscultation.    Heart:  Regular rate and rhythm. Abdomen:  Soft, nontender and nondistended. Normal bowel sounds, without guarding, and without rebound.   Neurologic:  Alert and  oriented x4;  grossly normal  neurologically.  Impression/Plan: Katelyn Bartlett is here for an endoscopy and colonoscopy to be performed for positive celiac serologies, colon cancer screening  Risks, benefits, limitations, and alternatives regarding  endoscopy and colonoscopy have been reviewed with the patient.  Questions have been answered.  All parties agreeable.   Sherri Sear, MD  01/12/2020, 8:38 AM

## 2020-01-12 NOTE — Op Note (Signed)
[facility] Gastroenterology Patient Name: Sameera Betton Procedure Date: 01/12/2020 8:34 AM MRN: [id number] Account #: [id number] Date of Birth: [birth year] Admit Type: Outpatient Age: 64 Room: [facility] ENDO ROOM 4 Gender: Female Note Status: Finalized Procedure:             Colonoscopy Indications:           Screening for colorectal malignant neoplasm, This is                         the patient's first colonoscopy Providers:             Lin Landsman MD, MD Medicines:             Monitored Anesthesia Care Complications:         No immediate complications. Estimated blood loss: None. Procedure:             Pre-Anesthesia Assessment:                        - Prior to the procedure, a History and Physical was                         performed, and patient medications and allergies were                         reviewed. The patient is competent. The risks and                         benefits of the procedure and the sedation options and                         risks were discussed with the patient. All questions                         were answered and informed consent was obtained.                         Patient identification and proposed procedure were                         verified by the physician, the nurse, the                         anesthesiologist, the anesthetist and the technician                         in the pre-procedure area in the procedure room in the                         endoscopy suite. Mental Status Examination: alert and                         oriented. Airway Examination: normal oropharyngeal                         airway and neck mobility. Respiratory Examination:                         clear to auscultation. CV Examination: normal.  Prophylactic Antibiotics: The patient does not require                         prophylactic antibiotics. Prior Anticoagulants: The                         patient has taken no  previous anticoagulant or                         antiplatelet agents. ASA Grade Assessment: II - A                         patient with mild systemic disease. After reviewing                         the risks and benefits, the patient was deemed in                         satisfactory condition to undergo the procedure. The                         anesthesia plan was to use monitored anesthesia care                         (MAC). Immediately prior to administration of                         medications, the patient was re-assessed for adequacy                         to receive sedatives. The heart rate, respiratory                         rate, oxygen saturations, blood pressure, adequacy of                         pulmonary ventilation, and response to care were                         monitored throughout the procedure. The physical                         status of the patient was re-assessed after the                         procedure.                        After obtaining informed consent, the colonoscope was                         passed under direct vision. Throughout the procedure,                         the patient's blood pressure, pulse, and oxygen                         saturations were monitored continuously. The  Colonoscope was introduced through the anus and                         advanced to the the terminal ileum, with                         identification of the appendiceal orifice and IC                         valve. The colonoscopy was performed without                         difficulty. The patient tolerated the procedure well.                         The quality of the bowel preparation was evaluated                         using the BBPS [facility] Bowel Preparation Scale) with                         scores of: Right Colon = 3, Transverse Colon = 3 and                         Left Colon = 3 (entire mucosa seen well with no                          residual staining, small fragments of stool or opaque                         liquid). The total BBPS score equals 9. Findings:      The perianal and digital rectal examinations were normal. Pertinent       negatives include normal sphincter tone and no palpable rectal lesions.      The terminal ileum appeared normal.      Normal mucosa was found in the entire colon. Biopsies for histology were       taken with a cold forceps from the entire colon for evaluation of       microscopic colitis.      Multiple diverticula were found in the sigmoid colon.      A 6 mm polyp was found in the distal rectum. The polyp was sessile. The       polyp was removed with a cold snare. Resection and retrieval were       complete. Impression:            - The examined portion of the ileum was normal.                        - Normal mucosa in the entire examined colon. Biopsied.                        - Diverticulosis in the sigmoid colon.                        - One 6 mm polyp in the distal rectum, removed with a  cold snare. Resected and retrieved. Recommendation:        - Discharge patient to home (with escort).                        - Resume previous diet today.                        - Continue present medications.                        - Await pathology results.                        - Return to my office as previously scheduled.                        - Repeat colonoscopy in 7 years for surveillance. Procedure Code(s):     --- Professional ---                        [phone], Colonoscopy, flexible; with removal of                         tumor(s), polyp(s), or other lesion(s) by snare                         technique                        45380, 2, Colonoscopy, flexible; with biopsy, single                         or multiple Diagnosis Code(s):     --- Professional ---                        Z12.11, Encounter for screening for malignant neoplasm                          of colon                        K62.1, Rectal polyp                        K57.30, Diverticulosis of large intestine without                         perforation or abscess without bleeding CPT copyright 2019 American Medical Association. All rights reserved. The codes documented in this report are preliminary and upon coder review may  be revised to meet current compliance requirements. Dr. Ulyess Mort Lin Landsman MD, MD 01/12/2020 9:18:57 AM This report has been signed electronically. Number of Addenda: 0 Note Initiated On: 01/12/2020 8:34 AM Scope Withdrawal Time: 0 hours 11 minutes 14 seconds  Total Procedure Duration: 0 hours 15 minutes 26 seconds  Estimated Blood Loss:  Estimated blood loss: none.      [facility]

## 2020-01-13 ENCOUNTER — Encounter: Payer: Self-pay | Admitting: Gastroenterology

## 2020-01-13 LAB — SURGICAL PATHOLOGY

## 2020-01-14 ENCOUNTER — Encounter: Payer: Self-pay | Admitting: Gastroenterology

## 2020-01-31 ENCOUNTER — Encounter: Payer: Self-pay | Admitting: Family Medicine

## 2020-02-01 NOTE — Telephone Encounter (Signed)
Enter this information into immunization records.

## 2020-02-04 DIAGNOSIS — L821 Other seborrheic keratosis: Secondary | ICD-10-CM | POA: Diagnosis not present

## 2020-02-04 DIAGNOSIS — C44619 Basal cell carcinoma of skin of left upper limb, including shoulder: Secondary | ICD-10-CM | POA: Diagnosis not present

## 2020-02-04 DIAGNOSIS — D2272 Melanocytic nevi of left lower limb, including hip: Secondary | ICD-10-CM | POA: Diagnosis not present

## 2020-02-04 DIAGNOSIS — D2262 Melanocytic nevi of left upper limb, including shoulder: Secondary | ICD-10-CM | POA: Diagnosis not present

## 2020-02-04 DIAGNOSIS — Z85828 Personal history of other malignant neoplasm of skin: Secondary | ICD-10-CM | POA: Diagnosis not present

## 2020-02-04 DIAGNOSIS — D225 Melanocytic nevi of trunk: Secondary | ICD-10-CM | POA: Diagnosis not present

## 2020-02-04 DIAGNOSIS — D485 Neoplasm of uncertain behavior of skin: Secondary | ICD-10-CM | POA: Diagnosis not present

## 2020-02-04 DIAGNOSIS — D2261 Melanocytic nevi of right upper limb, including shoulder: Secondary | ICD-10-CM | POA: Diagnosis not present

## 2020-02-11 ENCOUNTER — Ambulatory Visit: Payer: Medicare HMO | Admitting: Gastroenterology

## 2020-03-16 ENCOUNTER — Other Ambulatory Visit: Payer: Self-pay

## 2020-03-17 ENCOUNTER — Encounter: Payer: Self-pay | Admitting: Gastroenterology

## 2020-03-17 ENCOUNTER — Ambulatory Visit: Payer: Medicare HMO | Admitting: Gastroenterology

## 2020-03-17 ENCOUNTER — Other Ambulatory Visit: Payer: Self-pay

## 2020-03-17 ENCOUNTER — Ambulatory Visit (INDEPENDENT_AMBULATORY_CARE_PROVIDER_SITE_OTHER): Payer: Medicare HMO | Admitting: Gastroenterology

## 2020-03-17 VITALS — BP 126/75 | HR 77 | Temp 97.9°F | Wt 178.5 lb

## 2020-03-17 DIAGNOSIS — K900 Celiac disease: Secondary | ICD-10-CM

## 2020-03-17 DIAGNOSIS — M8580 Other specified disorders of bone density and structure, unspecified site: Secondary | ICD-10-CM | POA: Diagnosis not present

## 2020-03-17 NOTE — Progress Notes (Signed)
Cephas Darby, MD [address]  [place]  [place], [place] [address]  Main: [phone]  Fax: [phone]    Gastroenterology Consultation  Referring Provider:     Margo Common, PA Primary Care Physician:  Margo Common, PA Primary Gastroenterologist:  Dr. Cephas Darby Reason for Consultation: celiac disease        HPI:   Katelyn Bartlett is a 64 y.o. Caucasian female referred by Dr. Margo Common, PA  for consultation & management of chronic diarrhea. She recalls having initial onset of symptoms in 1982, she was told that she had parasite at that time which was treated. Followed by that she had few years of alternating constipation and diarrhea with abdominal cramps and was told that she has irritable bowel syndrome. She underwent a laparoscopic cholecystectomy in 1989 due to symptomatic cholelithiasis. She was asymptomatic for 2 years and then her symptoms recurred. Over the last several years she has been experiencing frequent bowel movements,3-5 times daily associated with abdominal cramps She denies having constipation. She maintained a log of her bowel movements in last 1 week which she shared with me today. Stools are variable in consistency anywhere from loose to soft, semi-formed, nonbloody, sometimes associated with cramps, sometimes postprandial. She reports not losing weight all these years. She is a chronic tobacco smoker, smokes less than a pack a day. She denies nausea, vomiting, abdominal pain. She tried Imodium in the past. She found temporary benefit from probiotics. She has been taking aspirin 325 mg daily for last 1 year or so recommended by her primary care provider. She denies taking naproxen which was listed on her medications. She is waiting to turn 64 so that she gets Medicaid to undergo colonoscopy. She did not have any kind of stool studies performed. She has normal CBC, TSH, CMP, HIV nonreactive  Follow-up visit 10/14/2017 Patient reports  that Bentyl is helping with her diarrhea. She takes up to 3 times a day. Workup of her diarrhea revealed that she has elevated celiac serologies, TTG IgA. Her stool studies were negative for infections. Pancreatic elastase came back normal. She decreased aspirin to 81 mg daily after discussing with her primary care doctor. She is learning more about celiac disease from Internet, reading blogs on gluten-free diet. She generally eats homemade food. 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 She denies weight loss, loss of appetite. She denies any other complaints today. She shared her financial constraints which are limiting her to undergo endoscopic evaluation at this time  Follow-up visit 02/20/2018 She is trying to follow a gluten-free diet. Continues to take Bentyl. She brought symptom diary with her today, she does have postprandial nonbloody diarrhea on most of the days. Her weight has been stable.  Follow-up visit 12/07/2019 Patient now has Medicaid since last visit, she wants to discuss about upper endoscopy.  She has ongoing symptoms of postprandial diarrhea, nonbloody,  she denies any weight loss.  Work-up since last visit revealed elevated TTG IgA levels.  Stool studies negative for infection.  Pancreatic fecal elastase levels normal.  She is still skeptical about undergoing colonoscopy due to her father had a complication during this procedure. Patient had a fall recently that resulted in ankle fracture.  She was recommended bone density testing which she did not undergo yet.  She also would like to discuss about the mass that she feels in her upper abdomen which she noticed about 1 year ago.  She denies any tenderness or increasing in size.  Follow-up visit 03/17/2020 Patient reports doing really well since endoscopy and colonoscopy.  She is no longer experiencing diarrhea.  She is having 2-3 semiformed bowel movements per day.  Is following strict gluten-free diet.   She gained few pounds due to decreased activity at her work lately.  NSAIDs: aspirin 81 mg daily  Antiplts/Anticoagulants/Anti thrombotics: none  GI Procedures:   EGD and colonoscopy 01/12/2020 - Normal duodenal bulb and second portion of the duodenum. Biopsied. - Erythematous mucosa in the stomach. Biopsied. - Erosive gastropathy with stigmata of recent bleeding. - Small hiatal hernia. - Esophagogastric landmarks identified. - LA Grade A reflux esophagitis with no bleeding.  - The examined portion of the ileum was normal. - Normal mucosa in the entire examined colon. Biopsied. - Diverticulosis in the sigmoid colon. - One 6 mm polyp in the distal rectum, removed with a cold snare. Resected and retrieved.  DIAGNOSIS:  A. DUODENUM; COLD BIOPSY:  - PATCHY VILLOUS BLUNTING AND INCREASED INTRAEPITHELIAL LYMPHOCYTES.  - SEE COMMENT.   Comment:  Per the electronic medical record, the patient has elevated TTG IgA. In  this clinical context, the histologic findings are compatible with  celiac disease. There is no evidence of dysplasia or malignancy.   B. STOMACH, RANDOM; COLD BIOPSY:  - REACTIVE GASTROPATHY.  - NEGATIVE FOR ACTIVE INFLAMMATION AND H PYLORI.  - NEGATIVE FOR INTESTINAL METAPLASIA, DYSPLASIA, AND MALIGNANCY.   C. COLON, RANDOM; COLD BIOPSY:  - COLONIC MUCOSA WITH NO SIGNIFICANT PATHOLOGIC ALTERATION.  - NEGATIVE FOR COLITIS, DYSPLASIA, AND MALIGNANCY.   D. RECTUM POLYP; COLD SNARE:  - HYPERPLASTIC POLYP.  - NEGATIVE FOR DYSPLASIA AND MALIGNANCY.   She is adopted, family history unknown  Past Medical History:  Diagnosis Date  . Abnormal weight gain 01/16/2015  . Hypercholesteremia   . Tachycardia     Past Surgical History:  Procedure Laterality Date  . ABDOMINAL HYSTERECTOMY  1990  . CHOLECYSTECTOMY  1989  . COLONOSCOPY WITH PROPOFOL N/A 01/12/2020   Procedure: COLONOSCOPY WITH PROPOFOL;  Surgeon: Lin Landsman, MD;  Location: [facility] ENDOSCOPY;   Service: Gastroenterology;  Laterality: N/A;  . ESOPHAGOGASTRODUODENOSCOPY (EGD) WITH PROPOFOL N/A 01/12/2020   Procedure: ESOPHAGOGASTRODUODENOSCOPY (EGD) WITH PROPOFOL;  Surgeon: Lin Landsman, MD;  Location: [facility] ENDOSCOPY;  Service: Gastroenterology;  Laterality: N/A;  . ROTATOR CUFF REPAIR Right 2007  . SKIN CANCER EXCISION  2011   ABOVE LIP     Current Outpatient Medications:  .  amLODipine (NORVASC) 5 MG tablet, Take 1 tablet (5 mg total) by mouth daily., Disp: 90 tablet, Rfl: 3 .  aspirin EC 81 MG tablet, Take 81 mg by mouth daily., Disp: , Rfl:  .  atorvastatin (LIPITOR) 40 MG tablet, Take 1 tablet (40 mg total) by mouth daily., Disp: 90 tablet, Rfl: 3 .  metoprolol tartrate (LOPRESSOR) 25 MG tablet, Take 1 tablet (25 mg total) by mouth 2 (two) times daily., Disp: 180 tablet, Rfl: 3 .  MULTIPLE VITAMIN PO, Take 1 tablet by mouth daily., Disp: , Rfl:  .  OMEGA-3 FATTY ACIDS PO, Take 1 capsule by mouth daily., Disp: , Rfl:  .  omeprazole (PRILOSEC) 40 MG capsule, Take 1 capsule (40 mg total) by mouth daily before breakfast., Disp: 30 capsule, Rfl: 2 .  amitriptyline (ELAVIL) 25 MG tablet, Take 25 mg by mouth at bedtime. (Patient not taking: Reported on 03/17/2020), Disp: , Rfl:  .  dicyclomine (BENTYL) 10 MG capsule, Take 1 capsule (10  mg total) by mouth 2 (two) times daily as needed. (Patient not taking: Reported on 03/17/2020), Disp: 180 capsule, Rfl: 0   Family History  Adopted: Yes  Family history unknown: Yes     Social History   Tobacco Use  . Smoking status: Current Every Day Smoker    Packs/day: 0.50    Years: 30.00    Pack years: 15.00    Types: Cigarettes  . Smokeless tobacco: Never Used  Substance Use Topics  . Alcohol use: No    Alcohol/week: 0.0 standard drinks  . Drug use: No    Allergies as of 03/17/2020 - Review Complete 03/17/2020  Allergen Reaction Noted  . Doxycycline hyclate  12/24/2019  . Morphine sulfate  01/16/2015    Review of Systems:     All systems reviewed and negative except where noted in HPI.   Physical Exam:  BP 126/75 (BP Location: Left Arm, Patient Position: Sitting, Cuff Size: Normal)   Pulse 77   Temp 97.9 F (36.6 C) (Oral)   Wt 178 lb 8 oz (81 kg)   BMI 28.81 kg/m  No LMP recorded. Patient has had a hysterectomy.  General:   Alert,  Well-developed, well-nourished, pleasant and cooperative in NAD Head:  Normocephalic and atraumatic. Eyes:  Sclera clear, no icterus.   Conjunctiva pink. Ears:  Normal auditory acuity. Nose:  No deformity, discharge, or lesions. Mouth:  No deformity or lesions,oropharynx pink & moist. Neck:  Supple; no masses or thyromegaly. Lungs:  Respirations even and unlabored.  Clear throughout to auscultation.   No wheezes, crackles, or rhonchi. No acute distress. Heart:  Regular rate and rhythm; no murmurs, clicks, rubs, or gallops. Abdomen:  Normal bowel sounds. Soft, non-tender, non-distended without masses, hepatosplenomegaly or hernias noted.  No guarding or rebound tenderness.   Rectal: Not performed Msk:  Symmetrical without gross deformities. Good, equal movement & strength bilaterally. Pulses:  Normal pulses noted. Extremities:  No clubbing or edema.  No cyanosis. Neurologic:  Alert and oriented x3;  grossly normal neurologically. Skin:  Intact without significant lesions or rashes. No jaundice. Psych:  Alert and cooperative. Normal mood and affect.  Imaging Studies: reviewed  Assessment and Plan:   Katelyn Bartlett is a 64 y.o. Caucasian female with no significant past medical history, status post laparoscopic cholecystectomy in 1989, with more than 20 years history of chronic nonbloody diarrhea with abdominal cramps. Workup revealed positive celiac serologies, upper endoscopy with duodenal biopsies confirmed celiac disease  Celiac disease Discussed about strict gluten-free diet Discussed about disease monitoring with follow-up upper endoscopy and celiac serologies  every 6 months No evidence of anemia DEXA scan revealed osteopenia, has mild vitamin D deficiency Currently on vitamin D supplements, follow-up by PCP for recheck vitamin D levels  Upper abdominal soft tissue CT abdomen and pelvis with contrast revealed small epigastric ventral hernia containing small amount of omental fat  Follow up in 6 months or sooner if needed   Cephas Darby, MD

## 2020-03-22 ENCOUNTER — Other Ambulatory Visit: Payer: Self-pay | Admitting: Family Medicine

## 2020-03-22 DIAGNOSIS — K580 Irritable bowel syndrome with diarrhea: Secondary | ICD-10-CM

## 2020-04-05 DIAGNOSIS — C44619 Basal cell carcinoma of skin of left upper limb, including shoulder: Secondary | ICD-10-CM | POA: Diagnosis not present

## 2020-04-18 ENCOUNTER — Encounter: Payer: Self-pay | Admitting: Gastroenterology

## 2020-04-21 ENCOUNTER — Other Ambulatory Visit: Payer: Self-pay | Admitting: Gastroenterology

## 2020-04-26 ENCOUNTER — Encounter: Payer: Self-pay | Admitting: Gastroenterology

## 2020-05-05 ENCOUNTER — Other Ambulatory Visit: Payer: Self-pay

## 2020-05-05 ENCOUNTER — Ambulatory Visit: Payer: Medicare HMO | Admitting: Podiatry

## 2020-05-05 ENCOUNTER — Encounter: Payer: Self-pay | Admitting: Podiatry

## 2020-05-05 ENCOUNTER — Ambulatory Visit (INDEPENDENT_AMBULATORY_CARE_PROVIDER_SITE_OTHER): Payer: Medicare HMO

## 2020-05-05 DIAGNOSIS — M778 Other enthesopathies, not elsewhere classified: Secondary | ICD-10-CM

## 2020-05-05 DIAGNOSIS — M7661 Achilles tendinitis, right leg: Secondary | ICD-10-CM

## 2020-05-05 MED ORDER — MELOXICAM 15 MG PO TABS: 15.0000 mg | ORAL_TABLET | Freq: Every day | ORAL | 1 refills | Status: DC

## 2020-05-05 NOTE — Progress Notes (Signed)
   HPI: 64 y.o. female presenting today for evaluation of multiple complaints regarding the bilateral feet.  Patient states that she has had a knot on her right Achilles tendon for approximately 2 years now.  Only recently over the last 3 days she is experienced some sharp shooting stabbing sensations intermittently.  She states that it will hurt and then randomly go away. She also states that since an injury that was sustained April 2020 one of the [facility] of high fibular fracture she has had some intermittent swelling to the left forefoot.  No pain however she describes it as pressure or discomfort.  She would like to have it evaluated today.  Past Medical History:  Diagnosis Date  . Abnormal weight gain 01/16/2015  . Hypercholesteremia   . Tachycardia      Physical Exam: General: The patient is alert and oriented x3 in no acute distress.  Dermatology: Skin is warm, dry and supple bilateral lower extremities. Negative for open lesions or macerations.  Vascular: Palpable pedal pulses bilaterally. No edema or erythema noted. Capillary refill within normal limits.  Neurological: Epicritic and protective threshold grossly intact bilaterally.   Musculoskeletal Exam: Range of motion within normal limits to all pedal and ankle joints bilateral. Muscle strength 5/5 in all groups bilateral.  Palpable nodule noted along the Achilles tendon midsubstance approximately 4-5 cm proximal to the insertion.  Nontender to palpation.  History of bunionectomy and hammertoe repair to the left forefoot  Radiographic Exam:  Normal osseous mineralization. Joint spaces preserved. No fracture/dislocation/boney destruction.    Assessment: 1.  Achilles tendinitis right 2.  Forefoot edema left   Plan of Care:  1. Patient evaluated. X-Rays reviewed.  2.  Compression ankle sleeve was dispensed bilateral 3.  Prescription for meloxicam 15 mg daily as needed 4.  Return to clinic as needed      Edrick Kins,  DPM [facility]  Dr. Edrick Kins, DPM    [address], [place] [address]                Office [phone]  Fax [phone]

## 2020-05-09 MED ORDER — MELOXICAM 15 MG PO TABS
15.0000 mg | ORAL_TABLET | Freq: Every day | ORAL | 1 refills | Status: DC
Start: 2020-05-09 — End: 2020-06-27

## 2020-05-09 NOTE — Addendum Note (Signed)
Addended by: Edrick Kins on: 05/09/2020 12:57 PM   Modules accepted: Orders

## 2020-05-10 ENCOUNTER — Encounter: Payer: Self-pay | Admitting: Podiatry

## 2020-06-27 ENCOUNTER — Ambulatory Visit (INDEPENDENT_AMBULATORY_CARE_PROVIDER_SITE_OTHER): Payer: Medicare HMO | Admitting: Family Medicine

## 2020-06-27 ENCOUNTER — Other Ambulatory Visit: Payer: Self-pay

## 2020-06-27 ENCOUNTER — Encounter: Payer: Self-pay | Admitting: Family Medicine

## 2020-06-27 VITALS — BP 124/84 | HR 79 | Temp 98.5°F | Resp 16 | Wt 175.0 lb

## 2020-06-27 DIAGNOSIS — E559 Vitamin D deficiency, unspecified: Secondary | ICD-10-CM | POA: Diagnosis not present

## 2020-06-27 DIAGNOSIS — I471 Supraventricular tachycardia: Secondary | ICD-10-CM | POA: Diagnosis not present

## 2020-06-27 DIAGNOSIS — I10 Essential (primary) hypertension: Secondary | ICD-10-CM | POA: Diagnosis not present

## 2020-06-27 DIAGNOSIS — E782 Mixed hyperlipidemia: Secondary | ICD-10-CM | POA: Diagnosis not present

## 2020-06-27 NOTE — Progress Notes (Signed)
Established patient visit   Patient: Katelyn Bartlett   DOB: 05-26-1956   64 y.o. Female  MRN: [id number] Visit Date: 06/27/2020  Today's healthcare provider: Vernie Murders, PA   Chief Complaint  Patient presents with  . Hyperlipidemia  . Hypertension  . Vit D deficiency   Subjective    HPI  Hypertension, follow-up  BP Readings from Last 3 Encounters:  06/27/20 124/84  03/17/20 126/75  01/12/20 116/69   Wt Readings from Last 3 Encounters:  06/27/20 175 lb (79.4 kg)  03/17/20 178 lb 8 oz (81 kg)  01/12/20 174 lb (78.9 kg)     She was last seen for hypertension 6 months ago.  BP at that visit was 116/69. Management since that visit includes continuation of Amlodipine 5 mg qd with Metoprolol Tartrate 25 mg BID.  She reports good compliance with treatment. She is not having side effects.  She is following a Regular diet. She is exercising. She does smoke.  Use of agents associated with hypertension: none.   Outside blood pressures are checked occasionally. Symptoms: No chest pain No chest pressure  No palpitations No syncope  No dyspnea No orthopnea  No paroxysmal nocturnal dyspnea No lower extremity edema   Pertinent labs: Lab Results  Component Value Date   CHOL 136 12/28/2019   HDL 45 12/28/2019   LDLCALC 70 12/28/2019   TRIG 116 12/28/2019   CHOLHDL 5.2 (H) 09/30/2019   Lab Results  Component Value Date   NA 140 12/28/2019   K 4.1 12/28/2019   CREATININE 0.64 12/28/2019   GFRNONAA 94 12/28/2019   GFRAA 108 12/28/2019   GLUCOSE 106 (H) 12/28/2019     The 10-year ASCVD risk score Mikey Bussing DC Jr., et al., 2013) is: 11.3%   --------------------------------------------------------------------------------------------------- Lipid/Cholesterol, Follow-up  Last lipid panel Other pertinent labs  Lab Results  Component Value Date   CHOL 136 12/28/2019   HDL 45 12/28/2019   LDLCALC 70 12/28/2019   TRIG 116 12/28/2019   CHOLHDL 5.2 (H) 09/30/2019     Lab Results  Component Value Date   ALT 31 12/28/2019   AST 27 12/28/2019   PLT 251 09/30/2019   TSH 3.400 12/28/2018     She was last seen for this 6 months ago.  Management since that visit includes no changes.  She reports good compliance with treatment. She is not having side effects.   Symptoms: No chest pain No chest pressure/discomfort  No dyspnea No lower extremity edema  No numbness or tingling of extremity No orthopnea  No palpitations No paroxysmal nocturnal dyspnea  No speech difficulty No syncope   Current diet: well balanced Current exercise: no regular exercise  The 10-year ASCVD risk score Mikey Bussing DC Jr., et al., 2013) is: 11.3%  Follow up for Vit D deficiency   The patient was last seen for this 6 months ago. Changes made at last visit include starting on Vit D 2000 IU BID.  She reports good compliance with treatment. She feels that condition is Improved. She is not having side effects.     Past Medical History:  Diagnosis Date  . Abnormal weight gain 01/16/2015  . Hypercholesteremia   . Tachycardia    Past Surgical History:  Procedure Laterality Date  . ABDOMINAL HYSTERECTOMY  1990  . CHOLECYSTECTOMY  1989  . COLONOSCOPY WITH PROPOFOL N/A 01/12/2020   Procedure: COLONOSCOPY WITH PROPOFOL;  Surgeon: Lin Landsman, MD;  Location: [facility] ENDOSCOPY;  Service: Gastroenterology;  Laterality: N/A;  .  ESOPHAGOGASTRODUODENOSCOPY (EGD) WITH PROPOFOL N/A 01/12/2020   Procedure: ESOPHAGOGASTRODUODENOSCOPY (EGD) WITH PROPOFOL;  Surgeon: Lin Landsman, MD;  Location: [place];  Service: Gastroenterology;  Laterality: N/A;  . ROTATOR CUFF REPAIR Right 2007  . SKIN CANCER EXCISION  2011   ABOVE LIP   Social History   Tobacco Use  . Smoking status: Current Every Day Smoker    Packs/day: 0.50    Years: 30.00    Pack years: 15.00    Types: Cigarettes  . Smokeless tobacco: Never Used  Substance Use Topics  . Alcohol use: No     Alcohol/week: 0.0 standard drinks  . Drug use: No   Family History  Adopted: Yes  Family history unknown: Yes   Allergies  Allergen Reactions  . Doxycycline Hyclate   . Morphine Sulfate     BP drops       Medications: Outpatient Medications Prior to Visit  Medication Sig  . amLODipine (NORVASC) 5 MG tablet Take 1 tablet (5 mg total) by mouth daily.  Marland Kitchen aspirin EC 81 MG tablet Take 81 mg by mouth daily.  Marland Kitchen atorvastatin (LIPITOR) 40 MG tablet Take 1 tablet (40 mg total) by mouth daily.  . metoprolol tartrate (LOPRESSOR) 25 MG tablet Take 1 tablet (25 mg total) by mouth 2 (two) times daily.  . MULTIPLE VITAMIN PO Take 1 tablet by mouth daily.  . OMEGA-3 FATTY ACIDS PO Take 1 capsule by mouth daily.  . meloxicam (MOBIC) 15 MG tablet Take 1 tablet (15 mg total) by mouth daily.   No facility-administered medications prior to visit.    Review of Systems  Constitutional: Negative.   Respiratory: Negative.   Cardiovascular: Negative.   Musculoskeletal: Negative.   Neurological: Negative.       Objective    BP 124/84   Pulse 79   Temp 98.5 F (36.9 C)   Resp 16   Wt 175 lb (79.4 kg)   BMI 28.25 kg/m    Physical Exam Constitutional:      General: She is not in acute distress.    Appearance: She is well-developed.  HENT:     Head: Normocephalic and atraumatic.     Right Ear: Hearing normal.     Left Ear: Hearing normal.     Nose: Nose normal.     Mouth/Throat:     Pharynx: Oropharynx is clear.  Eyes:     General: Lids are normal. No scleral icterus.       Right eye: No discharge.        Left eye: No discharge.     Conjunctiva/sclera: Conjunctivae normal.  Cardiovascular:     Rate and Rhythm: Normal rate and regular rhythm.     Heart sounds: Normal heart sounds.  Pulmonary:     Effort: Pulmonary effort is normal. No respiratory distress.     Breath sounds: Normal breath sounds.  Abdominal:     General: Bowel sounds are normal.     Palpations: Abdomen is  soft.  Musculoskeletal:        General: Normal range of motion.     Cervical back: Neck supple.  Skin:    Findings: No lesion or rash.  Neurological:     Mental Status: She is alert and oriented to person, place, and time.  Psychiatric:        Speech: Speech normal.        Behavior: Behavior normal.        Thought Content: Thought content normal.  No results found for any visits on 06/27/20.  Assessment & Plan     1. Essential hypertension Well controlled on the Lopressor and Amlodipine. Recheck labs. - CBC with Differential/Platelet - Comprehensive metabolic panel - Lipid panel - TSH  2. Supraventricular tachycardia (HCC) No recurrence since starting the Lopressor. No chest pains or palpitations. Continue present dosage. - CBC with Differential/Platelet - Comprehensive metabolic panel - TSH  3. Mixed hyperlipidemia Trying to follow a low fat diet and has continued Atorvastatin 40 mg qd. Recheck labs. - Comprehensive metabolic panel - Lipid panel - TSH  4. Vitamin D deficiency Continue present supplementation and recheck blood levels. - CBC with Differential/Platelet - VITAMIN D 25 Hydroxy (Vit-D Deficiency, Fractures)   No follow-ups on file.      Andres Shad, PA, have reviewed all documentation for this visit. The documentation on 06/27/20 for the exam, diagnosis, procedures, and orders are all accurate and complete.    Vernie Murders, [place] [phone] (phone) [phone] (fax)  [place]

## 2020-06-28 LAB — CBC WITH DIFFERENTIAL/PLATELET
Basophils Absolute: 0 10*3/uL (ref 0.0–0.2)
Basos: 1 %
EOS (ABSOLUTE): 0.1 10*3/uL (ref 0.0–0.4)
Eos: 1 %
Hematocrit: 44.4 % (ref 34.0–46.6)
Hemoglobin: 15.1 g/dL (ref 11.1–15.9)
Immature Grans (Abs): 0 10*3/uL (ref 0.0–0.1)
Immature Granulocytes: 0 %
Lymphocytes Absolute: 1.9 10*3/uL (ref 0.7–3.1)
Lymphs: 30 %
MCH: 31.4 pg (ref 26.6–33.0)
MCHC: 34 g/dL (ref 31.5–35.7)
MCV: 92 fL (ref 79–97)
Monocytes Absolute: 0.6 10*3/uL (ref 0.1–0.9)
Monocytes: 9 %
Neutrophils Absolute: 3.6 10*3/uL (ref 1.4–7.0)
Neutrophils: 59 %
Platelets: 258 10*3/uL (ref 150–450)
RBC: 4.81 x10E6/uL (ref 3.77–5.28)
RDW: 13.4 % (ref 11.7–15.4)
WBC: 6.2 10*3/uL (ref 3.4–10.8)

## 2020-06-28 LAB — TSH: TSH: 4.08 u[IU]/mL (ref 0.450–4.500)

## 2020-06-28 LAB — COMPREHENSIVE METABOLIC PANEL
ALT: 25 IU/L (ref 0–32)
AST: 24 IU/L (ref 0–40)
Albumin/Globulin Ratio: 2.1 (ref 1.2–2.2)
Albumin: 4.6 g/dL (ref 3.8–4.8)
Alkaline Phosphatase: 80 IU/L (ref 44–121)
BUN/Creatinine Ratio: 16 (ref 12–28)
BUN: 11 mg/dL (ref 8–27)
Bilirubin Total: 0.3 mg/dL (ref 0.0–1.2)
CO2: 22 mmol/L (ref 20–29)
Calcium: 9.1 mg/dL (ref 8.7–10.3)
Chloride: 105 mmol/L (ref 96–106)
Creatinine, Ser: 0.68 mg/dL (ref 0.57–1.00)
GFR calc Af Amer: 106 mL/min/{1.73_m2} (ref >59)
GFR calc non Af Amer: 92 mL/min/{1.73_m2} (ref >59)
Globulin, Total: 2.2 g/dL (ref 1.5–4.5)
Glucose: 107 mg/dL — ABNORMAL HIGH (ref 65–99)
Potassium: 3.9 mmol/L (ref 3.5–5.2)
Sodium: 141 mmol/L (ref 134–144)
Total Protein: 6.8 g/dL (ref 6.0–8.5)

## 2020-06-28 LAB — LIPID PANEL
Chol/HDL Ratio: 2.6 ratio (ref 0.0–4.4)
Cholesterol, Total: 105 mg/dL (ref 100–199)
HDL: 40 mg/dL (ref >39)
LDL Chol Calc (NIH): 52 mg/dL (ref 0–99)
Triglycerides: 54 mg/dL (ref 0–149)
VLDL Cholesterol Cal: 13 mg/dL (ref 5–40)

## 2020-06-28 LAB — VITAMIN D 25 HYDROXY (VIT D DEFICIENCY, FRACTURES): Vit D, 25-Hydroxy: 42.6 ng/mL (ref 30.0–100.0)

## 2020-06-29 ENCOUNTER — Encounter: Payer: Self-pay | Admitting: Family Medicine

## 2020-09-26 DIAGNOSIS — Z1231 Encounter for screening mammogram for malignant neoplasm of breast: Secondary | ICD-10-CM | POA: Diagnosis not present

## 2020-09-26 LAB — HM MAMMOGRAPHY

## 2020-12-21 ENCOUNTER — Other Ambulatory Visit: Payer: Self-pay | Admitting: Family Medicine

## 2020-12-21 DIAGNOSIS — E782 Mixed hyperlipidemia: Secondary | ICD-10-CM

## 2020-12-21 NOTE — Telephone Encounter (Signed)
Requested Prescriptions  Pending Prescriptions Disp Refills  . atorvastatin (LIPITOR) 40 MG tablet [Pharmacy Med Name: ATORVASTATIN 40 MG TABLET] 90 tablet 2    Sig: TAKE 1 TABLET BY MOUTH EVERY DAY     Cardiovascular:  Antilipid - Statins Passed - 12/21/2020  4:06 AM      Passed - Total Cholesterol in normal range and within 360 days    Cholesterol, Total  Date Value Ref Range Status  06/27/2020 105 100 - 199 mg/dL Final         Passed - LDL in normal range and within 360 days    LDL Chol Calc (NIH)  Date Value Ref Range Status  06/27/2020 52 0 - 99 mg/dL Final         Passed - HDL in normal range and within 360 days    HDL  Date Value Ref Range Status  06/27/2020 40 >39 mg/dL Final         Passed - Triglycerides in normal range and within 360 days    Triglycerides  Date Value Ref Range Status  06/27/2020 54 0 - 149 mg/dL Final         Passed - Patient is not pregnant      Passed - Valid encounter within last 12 months    Recent Outpatient Visits          5 months ago Essential hypertension   [facility], Vickki Muff, PA-C   11 months ago Benign hypertension   [facility], Vickki Muff, PA-C   1 year ago Foot pain, left   [place], [place], [place]   1 year ago Benign hypertension   [facility], Vickki Muff, PA-C   1 year ago Benign hypertension   [facility], Vickki Muff, PA-C             . metoprolol tartrate (LOPRESSOR) 25 MG tablet [Pharmacy Med Name: METOPROLOL TARTRATE 25 MG TAB] 180 tablet 1    Sig: TAKE 1 TABLET BY MOUTH TWICE A DAY     Cardiovascular:  Beta Blockers Passed - 12/21/2020  4:06 AM      Passed - Last BP in normal range    BP Readings from Last 1 Encounters:  06/27/20 124/84         Passed - Last Heart Rate in normal range    Pulse Readings from Last 1 Encounters:  06/27/20 79         Passed - Valid encounter within last 6  months    Recent Outpatient Visits          5 months ago Essential hypertension   [facility], Vickki Muff, PA-C   11 months ago Benign hypertension   [facility], Vickki Muff, PA-C   1 year ago Foot pain, left   [facility] [place], [place], [place]   1 year ago Benign hypertension   [facility], Vickki Muff, PA-C   1 year ago Benign hypertension   [facility], Dennis E, PA-C             . amLODipine (NORVASC) 5 MG tablet [Pharmacy Med Name: AMLODIPINE BESYLATE 5 MG TAB] 90 tablet 1    Sig: TAKE 1 TABLET BY MOUTH EVERY DAY     Cardiovascular:  Calcium Channel Blockers Passed - 12/21/2020  4:06 AM      Passed - Last BP in normal  range    BP Readings from Last 1 Encounters:  06/27/20 124/84         Passed - Valid encounter within last 6 months    Recent Outpatient Visits          5 months ago Essential hypertension   [place], PA-C   11 months ago Benign hypertension   [place], PA-C   1 year ago Foot pain, left   [facility] [place], [place], [place]   1 year ago Benign hypertension   [facility], Vickki Muff, PA-C   1 year ago Benign hypertension   [facility], Vickki Muff, [place]

## 2021-02-07 DIAGNOSIS — D2271 Melanocytic nevi of right lower limb, including hip: Secondary | ICD-10-CM | POA: Diagnosis not present

## 2021-02-07 DIAGNOSIS — Z85828 Personal history of other malignant neoplasm of skin: Secondary | ICD-10-CM | POA: Diagnosis not present

## 2021-02-07 DIAGNOSIS — D2262 Melanocytic nevi of left upper limb, including shoulder: Secondary | ICD-10-CM | POA: Diagnosis not present

## 2021-02-07 DIAGNOSIS — L821 Other seborrheic keratosis: Secondary | ICD-10-CM | POA: Diagnosis not present

## 2021-02-07 DIAGNOSIS — D2261 Melanocytic nevi of right upper limb, including shoulder: Secondary | ICD-10-CM | POA: Diagnosis not present

## 2021-02-09 DIAGNOSIS — M546 Pain in thoracic spine: Secondary | ICD-10-CM | POA: Diagnosis not present

## 2021-03-05 DIAGNOSIS — M546 Pain in thoracic spine: Secondary | ICD-10-CM | POA: Diagnosis not present

## 2021-03-05 DIAGNOSIS — M545 Low back pain, unspecified: Secondary | ICD-10-CM | POA: Diagnosis not present

## 2021-03-05 DIAGNOSIS — M542 Cervicalgia: Secondary | ICD-10-CM | POA: Diagnosis not present

## 2021-03-16 DIAGNOSIS — M546 Pain in thoracic spine: Secondary | ICD-10-CM | POA: Diagnosis not present

## 2021-03-21 DIAGNOSIS — M546 Pain in thoracic spine: Secondary | ICD-10-CM | POA: Diagnosis not present

## 2021-05-04 DIAGNOSIS — M542 Cervicalgia: Secondary | ICD-10-CM | POA: Diagnosis not present

## 2021-05-07 DIAGNOSIS — M546 Pain in thoracic spine: Secondary | ICD-10-CM | POA: Diagnosis not present

## 2021-05-07 DIAGNOSIS — M479 Spondylosis, unspecified: Secondary | ICD-10-CM | POA: Diagnosis not present

## 2021-05-07 DIAGNOSIS — M47814 Spondylosis without myelopathy or radiculopathy, thoracic region: Secondary | ICD-10-CM | POA: Diagnosis not present

## 2021-05-31 ENCOUNTER — Encounter: Payer: Self-pay | Admitting: Family Medicine

## 2021-05-31 ENCOUNTER — Ambulatory Visit (INDEPENDENT_AMBULATORY_CARE_PROVIDER_SITE_OTHER): Payer: Medicare HMO | Admitting: Family Medicine

## 2021-05-31 ENCOUNTER — Other Ambulatory Visit: Payer: Self-pay

## 2021-05-31 VITALS — BP 114/70 | HR 70 | Resp 16 | Wt 161.4 lb

## 2021-05-31 DIAGNOSIS — Z23 Encounter for immunization: Secondary | ICD-10-CM | POA: Insufficient documentation

## 2021-05-31 DIAGNOSIS — E559 Vitamin D deficiency, unspecified: Secondary | ICD-10-CM | POA: Insufficient documentation

## 2021-05-31 DIAGNOSIS — I10 Essential (primary) hypertension: Secondary | ICD-10-CM | POA: Diagnosis not present

## 2021-05-31 DIAGNOSIS — K900 Celiac disease: Secondary | ICD-10-CM | POA: Diagnosis not present

## 2021-05-31 DIAGNOSIS — E782 Mixed hyperlipidemia: Secondary | ICD-10-CM | POA: Insufficient documentation

## 2021-05-31 DIAGNOSIS — R7309 Other abnormal glucose: Secondary | ICD-10-CM | POA: Diagnosis not present

## 2021-05-31 DIAGNOSIS — R69 Illness, unspecified: Secondary | ICD-10-CM | POA: Diagnosis not present

## 2021-05-31 DIAGNOSIS — I471 Supraventricular tachycardia: Secondary | ICD-10-CM | POA: Diagnosis not present

## 2021-05-31 DIAGNOSIS — F17210 Nicotine dependence, cigarettes, uncomplicated: Secondary | ICD-10-CM | POA: Insufficient documentation

## 2021-05-31 MED ORDER — METOPROLOL TARTRATE 25 MG PO TABS: 25.0000 mg | ORAL_TABLET | Freq: Two times a day (BID) | ORAL | 3 refills | Status: DC

## 2021-05-31 MED ORDER — ATORVASTATIN CALCIUM 40 MG PO TABS: 40.0000 mg | ORAL_TABLET | Freq: Every day | ORAL | 3 refills | Status: DC

## 2021-05-31 MED ORDER — ZOSTER VAC RECOMB ADJUVANTED 50 MCG/0.5ML IM SUSR: 0.5000 mL | Freq: Once | INTRAMUSCULAR | 0 refills | Status: AC

## 2021-05-31 MED ORDER — AMLODIPINE BESYLATE 5 MG PO TABS: 5.0000 mg | ORAL_TABLET | Freq: Every day | ORAL | 3 refills | Status: DC

## 2021-05-31 NOTE — Assessment & Plan Note (Signed)
Refill provided recommend diet low in saturated fat and regular exercise - 30 min at least 5 times per week

## 2021-05-31 NOTE — Assessment & Plan Note (Signed)
Refused to quit at this time; denied PNA vaccine

## 2021-05-31 NOTE — Progress Notes (Signed)
Established patient visit   Patient: Katelyn Bartlett   DOB: [birth year]   65 y.o. Female  MRN: [id number] Visit Date: 05/31/2021  Today's healthcare provider: Gwyneth Sprout, FNP   Chief Complaint  Patient presents with   Hypertension   Hyperlipidemia   Subjective    HPI  Hypertension, follow-up  BP Readings from Last 3 Encounters:  05/31/21 114/70  06/27/20 124/84  03/17/20 126/75   Wt Readings from Last 3 Encounters:  05/31/21 161 lb 6.4 oz (73.2 kg)  06/27/20 175 lb (79.4 kg)  03/17/20 178 lb 8 oz (81 kg)     She was last seen for hypertension 11 months ago.  BP at that visit was 124/84. Management since that visit includes none continue Lopressor and Amlodipine.  She reports excellent compliance with treatment. She is not having side effects.  She is following a  poor  diet, patient reports she eats one meal a day. . She is not exercising. She does smoke.  Use of agents associated with hypertension: NSAIDS.   Outside blood pressures are not being checked . Symptoms: No chest pain No chest pressure  No palpitations No syncope  No dyspnea No orthopnea  No paroxysmal nocturnal dyspnea Yes lower extremity edema   Pertinent labs: Lab Results  Component Value Date   CHOL 105 06/27/2020   HDL 40 06/27/2020   LDLCALC 52 06/27/2020   TRIG 54 06/27/2020   CHOLHDL 2.6 06/27/2020   Lab Results  Component Value Date   NA 141 06/27/2020   K 3.9 06/27/2020   CREATININE 0.68 06/27/2020   GFRNONAA 92 06/27/2020   GLUCOSE 107 (H) 06/27/2020   TSH 4.080 06/27/2020     The ASCVD Risk score (Arnett DK, et al., 2019) failed to calculate for the following reasons:   The valid total cholesterol range is 130 to 320 mg/dL   ---------------------------------------------------------------------------------------------------  Lipid/Cholesterol, Follow-up  Last lipid panel Other pertinent labs  Lab Results  Component Value Date   CHOL 105 06/27/2020   HDL 40  06/27/2020   LDLCALC 52 06/27/2020   TRIG 54 06/27/2020   CHOLHDL 2.6 06/27/2020   Lab Results  Component Value Date   ALT 25 06/27/2020   AST 24 06/27/2020   PLT 258 06/27/2020   TSH 4.080 06/27/2020     She was last seen for this 11 months ago.  Management since that visit includes work on low fat diet and continue Atorvastatin.  She reports excellent compliance with treatment. She is not having side effects.   Symptoms: No chest pain No chest pressure/discomfort  No dyspnea Yes lower extremity edema  No numbness or tingling of extremity No orthopnea  No palpitations No paroxysmal nocturnal dyspnea  No speech difficulty No syncope   Current diet: in general, an "unhealthy" diet Current exercise: not asked  The ASCVD Risk score (Arnett DK, et al., 2019) failed to calculate for the following reasons:   The valid total cholesterol range is 130 to 320 mg/dL  ---------------------------------------------------------------------------------------------------     Medications: Outpatient Medications Prior to Visit  Medication Sig   aspirin EC 81 MG tablet Take 81 mg by mouth daily.   MULTIPLE VITAMIN PO Take 1 tablet by mouth daily.   [DISCONTINUED] amLODipine (NORVASC) 5 MG tablet TAKE 1 TABLET BY MOUTH EVERY DAY   [DISCONTINUED] atorvastatin (LIPITOR) 40 MG tablet TAKE 1 TABLET BY MOUTH EVERY DAY   [DISCONTINUED] metoprolol tartrate (LOPRESSOR) 25 MG tablet TAKE 1 TABLET BY  MOUTH TWICE A DAY   [DISCONTINUED] OMEGA-3 FATTY ACIDS PO Take 1 capsule by mouth daily.   No facility-administered medications prior to visit.    Review of Systems     Objective    BP 114/70   Pulse 70   Resp 16   Wt 161 lb 6.4 oz (73.2 kg)   SpO2 100%   BMI 26.05 kg/m    Physical Exam Vitals and nursing note reviewed.  Constitutional:      General: She is not in acute distress.    Appearance: Normal appearance. She is overweight. She is not ill-appearing, toxic-appearing or  diaphoretic.  HENT:     Head: Normocephalic and atraumatic.  Cardiovascular:     Rate and Rhythm: Normal rate and regular rhythm.     Pulses: Normal pulses.     Heart sounds: Normal heart sounds. No murmur heard.   No friction rub. No gallop.  Pulmonary:     Effort: Pulmonary effort is normal. No respiratory distress.     Breath sounds: Normal breath sounds. No stridor. No wheezing, rhonchi or rales.  Chest:     Chest wall: No tenderness.  Abdominal:     General: Bowel sounds are normal.     Palpations: Abdomen is soft.  Musculoskeletal:        General: No swelling, tenderness, deformity or signs of injury. Normal range of motion.     Right lower leg: No edema.     Left lower leg: No edema.  Skin:    General: Skin is warm and dry.     Capillary Refill: Capillary refill takes less than 2 seconds.     Coloration: Skin is not jaundiced or pale.     Findings: No bruising, erythema, lesion or rash.  Neurological:     General: No focal deficit present.     Mental Status: She is alert and oriented to person, place, and time. Mental status is at baseline.     Cranial Nerves: No cranial nerve deficit.     Sensory: No sensory deficit.     Motor: No weakness.     Coordination: Coordination normal.  Psychiatric:        Mood and Affect: Mood normal.        Behavior: Behavior normal.        Thought Content: Thought content normal.        Judgment: Judgment normal.      No results found for any visits on 05/31/21.  Assessment & Plan     Problem List Items Addressed This Visit       Cardiovascular and Mediastinum   Supraventricular tachycardia ([place])    Stable on BID dosing of BB; denies other palpitations Denies anxiety Denies insomnia      Relevant Medications   amLODipine (NORVASC) 5 MG tablet   atorvastatin (LIPITOR) 40 MG tablet   metoprolol tartrate (LOPRESSOR) 25 MG tablet   Essential hypertension    Chronic, stable Due for medication refills No SOB, DOE, no LE  edema      Relevant Medications   amLODipine (NORVASC) 5 MG tablet   atorvastatin (LIPITOR) 40 MG tablet   metoprolol tartrate (LOPRESSOR) 25 MG tablet     Digestive   Celiac disease    Limits meals; d/t increase in BMs 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 Eats 3 meals on w/es        Other   Combined fat and carbohydrate induced hyperlipemia  Refill provided recommend diet low in saturated fat and regular exercise - 30 min at least 5 times per week       Relevant Medications   amLODipine (NORVASC) 5 MG tablet   atorvastatin (LIPITOR) 40 MG tablet   metoprolol tartrate (LOPRESSOR) 25 MG tablet   Elevated glucose - Primary    Due for a1c Check chemistry Denies sweets/snacks      Relevant Orders   Hemoglobin A1c   Comprehensive metabolic panel   Mixed hyperlipidemia    Repeat lipid panel Purposeful weight loss      Relevant Medications   amLODipine (NORVASC) 5 MG tablet   atorvastatin (LIPITOR) 40 MG tablet   metoprolol tartrate (LOPRESSOR) 25 MG tablet   Other Relevant Orders   Lipid panel   Continuous dependence on cigarette smoking    Refused to quit at this time; denied PNA vaccine      Vitamin D deficiency    Request for repeat lab work; aware there may be a a cost associated with lab test Has been taking supplements since ankle fracture      Relevant Orders   Vitamin D (25 hydroxy)   Need for shingles vaccine    Rx provided; had shingles in 20s        Return in about 6 months (around 11/28/2021) for annual examination.      Vonna Kotyk, FNP, have reviewed all documentation for this visit. The documentation on 05/31/21 for the exam, diagnosis, procedures, and orders are all accurate and complete.    Gwyneth Sprout, [place] [phone] (phone) [phone] (fax)  [place]

## 2021-05-31 NOTE — Assessment & Plan Note (Signed)
Request for repeat lab work; aware there may be a a cost associated with lab test Has been taking supplements since ankle fracture

## 2021-05-31 NOTE — Assessment & Plan Note (Signed)
Due for a1c Check chemistry Denies sweets/snacks

## 2021-05-31 NOTE — Assessment & Plan Note (Signed)
Repeat lipid panel Purposeful weight loss

## 2021-05-31 NOTE — Assessment & Plan Note (Signed)
Stable on BID dosing of BB; denies other palpitations Denies anxiety Denies insomnia

## 2021-05-31 NOTE — Assessment & Plan Note (Signed)
Rx provided; had shingles in 20s

## 2021-05-31 NOTE — Assessment & Plan Note (Signed)
Chronic, stable Due for medication refills No SOB, DOE, no LE edema

## 2021-05-31 NOTE — Assessment & Plan Note (Signed)
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

## 2021-06-01 LAB — COMPREHENSIVE METABOLIC PANEL
ALT: 23 IU/L (ref 0–32)
AST: 20 IU/L (ref 0–40)
Albumin/Globulin Ratio: 2.6 — ABNORMAL HIGH (ref 1.2–2.2)
Albumin: 5 g/dL — ABNORMAL HIGH (ref 3.8–4.8)
Alkaline Phosphatase: 79 IU/L (ref 44–121)
BUN/Creatinine Ratio: 19 (ref 12–28)
BUN: 11 mg/dL (ref 8–27)
Bilirubin Total: 0.5 mg/dL (ref 0.0–1.2)
CO2: 24 mmol/L (ref 20–29)
Calcium: 9.4 mg/dL (ref 8.7–10.3)
Chloride: 105 mmol/L (ref 96–106)
Creatinine, Ser: 0.58 mg/dL (ref 0.57–1.00)
Globulin, Total: 1.9 g/dL (ref 1.5–4.5)
Glucose: 102 mg/dL — ABNORMAL HIGH (ref 70–99)
Potassium: 3.7 mmol/L (ref 3.5–5.2)
Sodium: 143 mmol/L (ref 134–144)
Total Protein: 6.9 g/dL (ref 6.0–8.5)
eGFR: 100 mL/min/{1.73_m2} (ref >59)

## 2021-06-01 LAB — LIPID PANEL
Chol/HDL Ratio: 3.1 ratio (ref 0.0–4.4)
Cholesterol, Total: 120 mg/dL (ref 100–199)
HDL: 39 mg/dL — ABNORMAL LOW (ref >39)
LDL Chol Calc (NIH): 62 mg/dL (ref 0–99)
Triglycerides: 103 mg/dL (ref 0–149)
VLDL Cholesterol Cal: 19 mg/dL (ref 5–40)

## 2021-06-01 LAB — HEMOGLOBIN A1C
Est. average glucose Bld gHb Est-mCnc: 120 mg/dL
Hgb A1c MFr Bld: 5.8 % — ABNORMAL HIGH (ref 4.8–5.6)

## 2021-06-01 LAB — VITAMIN D 25 HYDROXY (VIT D DEFICIENCY, FRACTURES): Vit D, 25-Hydroxy: 40.7 ng/mL (ref 30.0–100.0)

## 2021-06-01 NOTE — Progress Notes (Signed)
New dx of prediabetes.  Would recommend that we work to find a happy medium to eat more frequently to control Bms frequency at work.  Smaller/more frequent meals are helpful in preventing spikes in blood sugar.  Recommend increasing healthier fats- fish, nuts, olive oils, avocados, and increasing exercise to assist with your HDL/good cholesterol to protect your heart.  Vit D is stable at this time; I think a supplement is helpful to prevent bone loss/deterioration and further breaks. You could try a larger supplement weekly or switch to something every other day to make it less of a burden.  Please let us know if you have any questions.  Thank you,  Tally Joe, FNP

## 2021-06-06 DIAGNOSIS — M545 Low back pain, unspecified: Secondary | ICD-10-CM | POA: Diagnosis not present

## 2021-06-06 DIAGNOSIS — M47817 Spondylosis without myelopathy or radiculopathy, lumbosacral region: Secondary | ICD-10-CM | POA: Diagnosis not present

## 2021-06-18 DIAGNOSIS — M479 Spondylosis, unspecified: Secondary | ICD-10-CM | POA: Diagnosis not present

## 2021-06-18 DIAGNOSIS — M791 Myalgia, unspecified site: Secondary | ICD-10-CM | POA: Diagnosis not present

## 2021-08-06 DIAGNOSIS — M479 Spondylosis, unspecified: Secondary | ICD-10-CM | POA: Diagnosis not present

## 2021-08-06 DIAGNOSIS — M791 Myalgia, unspecified site: Secondary | ICD-10-CM | POA: Diagnosis not present

## 2021-08-08 DIAGNOSIS — M5459 Other low back pain: Secondary | ICD-10-CM | POA: Diagnosis not present

## 2021-09-18 DIAGNOSIS — M479 Spondylosis, unspecified: Secondary | ICD-10-CM | POA: Diagnosis not present

## 2021-09-18 DIAGNOSIS — M791 Myalgia, unspecified site: Secondary | ICD-10-CM | POA: Diagnosis not present

## 2021-10-02 DIAGNOSIS — Z1231 Encounter for screening mammogram for malignant neoplasm of breast: Secondary | ICD-10-CM | POA: Diagnosis not present

## 2021-10-02 LAB — HM MAMMOGRAPHY

## 2021-10-03 ENCOUNTER — Telehealth: Payer: Self-pay | Admitting: Family Medicine

## 2021-10-03 NOTE — Telephone Encounter (Signed)
Spoke with patient to schedule Medicare Annual Wellness Visit (AWV) either virtually or in office. ? ?Patient declined stating she will do with pcp. She stated it is hard for her to get off work ? ? ? ? ?

## 2021-10-11 DIAGNOSIS — H2513 Age-related nuclear cataract, bilateral: Secondary | ICD-10-CM | POA: Diagnosis not present

## 2021-11-27 DIAGNOSIS — M545 Low back pain, unspecified: Secondary | ICD-10-CM | POA: Diagnosis not present

## 2021-11-28 NOTE — Progress Notes (Signed)
? ? ? ?Complete physical exam ? ? ?Patient: Katelyn Bartlett   DOB: 1955/10/01   66 y.o. Female  MRN: [id number] ?Visit Date: 11/30/2021 ? ?Today's healthcare provider: Gwyneth Sprout, FNP  ?Re Introduced to nurse practitioner role and practice setting.  All questions answered.  Discussed provider/patient relationship and expectations. ? ? ?[employer] as a Education administrator for Gwyneth Sprout, FNP.,have documented all relevant documentation on the behalf of Gwyneth Sprout, FNP,as directed by  Gwyneth Sprout, FNP while in the presence of Gwyneth Sprout, FNP.  ?Chief Complaint  ?Patient presents with  ? Annual Exam  ? ?Subjective  ?  ?Katelyn Bartlett is a 66 y.o. female who presents today for a complete physical exam.  ?She reports consuming a  poor  diet, 1 meal a day patient reports that she eats 2 meals a day on weekends. The patient does not participate in regular exercise at present. She generally feels poorly. She reports sleeping fairly well. She does have additional problems to discuss today, patient reports for the past week she has been experiencing sciatic nerve pain on the right side and states that she is currently on prednisone with no relief. Patient reports chronic back pain has been present for a year now.  ?HPI  ? ? ?Past Medical History:  ?Diagnosis Date  ? Abnormal weight gain 01/16/2015  ? Hypercholesteremia   ? Tachycardia   ? ?Past Surgical History:  ?Procedure Laterality Date  ? ABDOMINAL HYSTERECTOMY  1990  ? CHOLECYSTECTOMY  1989  ? COLONOSCOPY WITH PROPOFOL N/A 01/12/2020  ? Procedure: COLONOSCOPY WITH PROPOFOL;  Surgeon: Lin Landsman, MD;  Location: [facility] ENDOSCOPY;  Service: Gastroenterology;  Laterality: N/A;  ? ESOPHAGOGASTRODUODENOSCOPY (EGD) WITH PROPOFOL N/A 01/12/2020  ? Procedure: ESOPHAGOGASTRODUODENOSCOPY (EGD) WITH PROPOFOL;  Surgeon: Lin Landsman, MD;  Location: [facility] ENDOSCOPY;  Service: Gastroenterology;  Laterality: N/A;  ? ROTATOR CUFF REPAIR Right 2007  ? SKIN  CANCER EXCISION  2011  ? ABOVE LIP  ? ?Social History  ? ?Socioeconomic History  ? Marital status: Widowed  ?  Spouse name: Not on file  ? Number of children: Not on file  ? Years of education: Not on file  ? Highest education level: Not on file  ?Occupational History  ? Not on file  ?Tobacco Use  ? Smoking status: Every Day  ?  Packs/day: 0.50  ?  Years: 30.00  ?  Pack years: 15.00  ?  Types: Cigarettes  ? Smokeless tobacco: Never  ?Substance and Sexual Activity  ? Alcohol use: No  ?  Alcohol/week: 0.0 standard drinks  ? Drug use: No  ? Sexual activity: Not on file  ?Other Topics Concern  ? Not on file  ?Social History Narrative  ? Not on file  ? ?Social Determinants of Health  ? ?Financial Resource Strain: Not on file  ?Food Insecurity: Not on file  ?Transportation Needs: Not on file  ?Physical Activity: Not on file  ?Stress: Not on file  ?Social Connections: Not on file  ?Intimate Partner Violence: Not on file  ? ?No family status information on file.  ? ?Family History  ?Adopted: Yes  ?Family history unknown: Yes  ? ?Allergies  ?Allergen Reactions  ? Doxycycline Hyclate   ? Morphine Sulfate   ?  BP drops  ?  ?Patient Care Team: ?Gwyneth Sprout, FNP as PCP - General (Family Medicine)  ? ?Medications: ?Outpatient Medications Prior to Visit  ?Medication Sig  ? amLODipine (  NORVASC) 5 MG tablet Take 1 tablet (5 mg total) by mouth daily.  ? aspirin EC 81 MG tablet Take 81 mg by mouth daily.  ? atorvastatin (LIPITOR) 40 MG tablet Take 1 tablet (40 mg total) by mouth daily.  ? metoprolol tartrate (LOPRESSOR) 25 MG tablet Take 1 tablet (25 mg total) by mouth 2 (two) times daily.  ? MULTIPLE VITAMIN PO Take 1 tablet by mouth daily.  ? ?No facility-administered medications prior to visit.  ? ? ?Review of Systems  ?HENT:  Positive for drooling, ear discharge and tinnitus.   ?Gastrointestinal:  Positive for diarrhea.  ?Musculoskeletal:  Positive for back pain.  ?All other systems reviewed and are negative. ? ? ? Objective   ? ?  ?BP (!) 113/94   Pulse 72   Temp 98.6 ?F (37 ?C) (Oral)   Resp 16   Wt 163 lb (73.9 kg)   SpO2 96%   BMI 26.31 kg/m?  ? ? ? ?Physical Exam ?Vitals and nursing note reviewed.  ?Constitutional:   ?   General: She is awake. She is not in acute distress. ?   Appearance: Normal appearance. She is well-developed, well-groomed and overweight. She is not ill-appearing, toxic-appearing or diaphoretic.  ?HENT:  ?   Head: Normocephalic and atraumatic.  ?   Jaw: There is normal jaw occlusion. No trismus, tenderness, swelling or pain on movement.  ?   Right Ear: Hearing, tympanic membrane, ear canal and external ear normal. There is no impacted cerumen.  ?   Left Ear: Hearing, tympanic membrane, ear canal and external ear normal. There is no impacted cerumen.  ?   Ears:  ?   Comments: Complaints of bilateral tinnitus; denies loss of hearing ?   Nose: Nose normal. No congestion or rhinorrhea.  ?   Right Turbinates: Not enlarged, swollen or pale.  ?   Left Turbinates: Not enlarged, swollen or pale.  ?   Right Sinus: No maxillary sinus tenderness or frontal sinus tenderness.  ?   Left Sinus: No maxillary sinus tenderness or frontal sinus tenderness.  ?   Mouth/Throat:  ?   Lips: Pink.  ?   Mouth: Mucous membranes are moist. No injury.  ?   Tongue: No lesions.  ?   Pharynx: Oropharynx is clear. Uvula midline. No pharyngeal swelling, oropharyngeal exudate, posterior oropharyngeal erythema or uvula swelling.  ?   Tonsils: No tonsillar exudate or tonsillar abscesses.  ?Eyes:  ?   General: Lids are normal. Lids are everted, no foreign bodies appreciated. Vision grossly intact. Gaze aligned appropriately. No allergic shiner or visual field deficit.    ?   Right eye: No discharge.     ?   Left eye: No discharge.  ?   Extraocular Movements: Extraocular movements intact.  ?   Conjunctiva/sclera: Conjunctivae normal.  ?   Right eye: Right conjunctiva is not injected. No exudate. ?   Left eye: Left conjunctiva is not injected. No  exudate. ?   Pupils: Pupils are equal, round, and reactive to light.  ?Neck:  ?   Thyroid: No thyroid mass, thyromegaly or thyroid tenderness.  ?   Vascular: No carotid bruit.  ?   Trachea: Trachea normal.  ?Cardiovascular:  ?   Rate and Rhythm: Normal rate and regular rhythm.  ?   Pulses: Normal pulses.     ?     Carotid pulses are 2+ on the right side and 2+ on the left side. ?  Radial pulses are 2+ on the right side and 2+ on the left side.  ?     Dorsalis pedis pulses are 2+ on the right side and 2+ on the left side.  ?     Posterior tibial pulses are 2+ on the right side and 2+ on the left side.  ?   Heart sounds: Normal heart sounds, S1 normal and S2 normal. No murmur heard. ?  No friction rub. No gallop.  ?Pulmonary:  ?   Effort: Pulmonary effort is normal. No respiratory distress.  ?   Breath sounds: Normal breath sounds and air entry. No stridor. No wheezing, rhonchi or rales.  ?Chest:  ?   Chest wall: No tenderness.  ?Abdominal:  ?   General: Abdomen is flat. Bowel sounds are normal. There is no distension.  ?   Palpations: Abdomen is soft. There is no mass.  ?   Tenderness: There is no abdominal tenderness. There is no right CVA tenderness, left CVA tenderness, guarding or rebound.  ?   Hernia: No hernia is present.  ?Genitourinary: ?   Comments: Exam deferred; denies complaints ?Musculoskeletal:     ?   General: Tenderness present. No swelling, deformity or signs of injury. Normal range of motion.  ?   Cervical back: Full passive range of motion without pain, normal range of motion and neck supple. No edema, rigidity or tenderness. No muscular tenderness.  ?   Right lower leg: No edema.  ?   Left lower leg: Edema present.  ?   Comments: Reports occasional LLE ankle edema   ?Lymphadenopathy:  ?   Cervical: No cervical adenopathy.  ?   Right cervical: No superficial, deep or posterior cervical adenopathy. ?   Left cervical: No superficial, deep or posterior cervical adenopathy.  ?Skin: ?   General: Skin  is warm and dry.  ?   Capillary Refill: Capillary refill takes less than 2 seconds.  ?   Coloration: Skin is not jaundiced or pale.  ?   Findings: No bruising, erythema, lesion or rash.  ?Neurological:  ?   Gen

## 2021-11-29 ENCOUNTER — Encounter: Payer: Medicare HMO | Admitting: Family Medicine

## 2021-11-30 ENCOUNTER — Encounter: Payer: Self-pay | Admitting: Family Medicine

## 2021-11-30 ENCOUNTER — Ambulatory Visit (INDEPENDENT_AMBULATORY_CARE_PROVIDER_SITE_OTHER): Payer: Medicare HMO | Admitting: Family Medicine

## 2021-11-30 VITALS — BP 113/94 | HR 72 | Temp 98.6°F | Resp 16 | Wt 163.0 lb

## 2021-11-30 DIAGNOSIS — I471 Supraventricular tachycardia: Secondary | ICD-10-CM

## 2021-11-30 DIAGNOSIS — M5441 Lumbago with sciatica, right side: Secondary | ICD-10-CM

## 2021-11-30 DIAGNOSIS — I10 Essential (primary) hypertension: Secondary | ICD-10-CM | POA: Diagnosis not present

## 2021-11-30 DIAGNOSIS — K900 Celiac disease: Secondary | ICD-10-CM

## 2021-11-30 DIAGNOSIS — Z0000 Encounter for general adult medical examination without abnormal findings: Secondary | ICD-10-CM | POA: Insufficient documentation

## 2021-11-30 DIAGNOSIS — R69 Illness, unspecified: Secondary | ICD-10-CM | POA: Diagnosis not present

## 2021-11-30 DIAGNOSIS — R7303 Prediabetes: Secondary | ICD-10-CM

## 2021-11-30 DIAGNOSIS — M25551 Pain in right hip: Secondary | ICD-10-CM | POA: Diagnosis not present

## 2021-11-30 DIAGNOSIS — I7300 Raynaud's syndrome without gangrene: Secondary | ICD-10-CM | POA: Diagnosis not present

## 2021-11-30 DIAGNOSIS — G8929 Other chronic pain: Secondary | ICD-10-CM | POA: Insufficient documentation

## 2021-11-30 DIAGNOSIS — K529 Noninfective gastroenteritis and colitis, unspecified: Secondary | ICD-10-CM

## 2021-11-30 DIAGNOSIS — E782 Mixed hyperlipidemia: Secondary | ICD-10-CM

## 2021-11-30 DIAGNOSIS — F17210 Nicotine dependence, cigarettes, uncomplicated: Secondary | ICD-10-CM

## 2021-11-30 NOTE — Assessment & Plan Note (Signed)
Acute on chronic, currently on prednisone ?Currently taking Vicodin and Excedrin to assist ?Icing every night after work  ?F/B Ortho ?

## 2021-11-30 NOTE — Assessment & Plan Note (Signed)
Chronic, elevated ?Previously on Lipitor at 40 mg ?recommend diet low in saturated fat and regular exercise - 30 min at least 5 times per week ?Recommend smoking cessation ?

## 2021-11-30 NOTE — Assessment & Plan Note (Signed)
Chronic, stable ?Continue 5 mg of Norvasc ?Slight worsening L ankle edema at end of day; none present at time of exam ?

## 2021-11-30 NOTE — Assessment & Plan Note (Signed)
Chronic, stable ?Denies any current/recent exacerbations  ?Continue Metop 25 mg BID ?

## 2021-11-30 NOTE — Assessment & Plan Note (Signed)
Chronic, stable ?Continues to have >6 BMs/day ?Limits meals d/t GI complaints ?Normal invasive screenings completed by GI ?Discussed use of FODMAP to assist; does not wish to be on medication d/t cost  ?

## 2021-11-30 NOTE — Assessment & Plan Note (Signed)
Chronic, stable ?Controlled with diet and exercise ?Repeat A1c ?Continue to recommend balanced, lower carb meals. Smaller meal size, adding snacks. Choosing water as drink of choice and increasing purposeful exercise. ? ?

## 2021-11-30 NOTE — Assessment & Plan Note (Signed)
Repeat labs today; recommend 6 month RTC given chronic conditions ? ?

## 2021-11-30 NOTE — Assessment & Plan Note (Signed)
Chronic, stable ?Does not wish to stop/reduce ?Associated drooling with turn down of corner of R side of mouth and repeated down looking at work ?

## 2021-11-30 NOTE — Assessment & Plan Note (Signed)
Chronic, elevated; likely d/t pain ?Denies CP ?Denies SOB/ DOE ?Denies low blood pressure/hypotension ?Denies vision changes ?No LE Edema noted on exam; reports of LLE ankle edema ?Continue medication, Metop 25 mg BID, Norvasc 5 mg ?Denies side effects ?RTC 6 months ?Seek emergent care if you develop chest pain or chest pressure ? ?

## 2021-11-30 NOTE — Assessment & Plan Note (Signed)
Discussed vaccines needed; patient does not wish to get at this time ?Things to do to keep yourself healthy  ?- Exercise at least 30-45 minutes a day, 3-4 days a week.  ?- Eat a low-fat diet with lots of fruits and vegetables, up to 7-9 servings per day.  ?- Seatbelts can save your life. Wear them always.  ?- Smoke detectors on every level of your home, check batteries every year.  ?- Eye Doctor - have an eye exam every 1-2 years  ?- Safe sex - if you may be exposed to STDs, use a condom.  ?- Alcohol -  If you drink, do it moderately, less than 2 drinks per day.  ?- [place]. Choose someone to speak for you if you are not able.  ?- Depression is common in our stressful world.If you're feeling down or losing interest in things you normally enjoy, please come in for a visit.  ?- Violence - If anyone is threatening or hurting you, please call immediately. ? ? ?

## 2021-12-01 LAB — COMPREHENSIVE METABOLIC PANEL
ALT: 21 IU/L (ref 0–32)
AST: 18 IU/L (ref 0–40)
Albumin/Globulin Ratio: 2 (ref 1.2–2.2)
Albumin: 5.3 g/dL — ABNORMAL HIGH (ref 3.8–4.8)
Alkaline Phosphatase: 77 IU/L (ref 44–121)
BUN/Creatinine Ratio: 25 (ref 12–28)
BUN: 18 mg/dL (ref 8–27)
Bilirubin Total: 0.8 mg/dL (ref 0.0–1.2)
CO2: 22 mmol/L (ref 20–29)
Calcium: 10 mg/dL (ref 8.7–10.3)
Chloride: 101 mmol/L (ref 96–106)
Creatinine, Ser: 0.72 mg/dL (ref 0.57–1.00)
Globulin, Total: 2.6 g/dL (ref 1.5–4.5)
Glucose: 94 mg/dL (ref 70–99)
Potassium: 4.3 mmol/L (ref 3.5–5.2)
Sodium: 140 mmol/L (ref 134–144)
Total Protein: 7.9 g/dL (ref 6.0–8.5)
eGFR: 92 mL/min/{1.73_m2} (ref >59)

## 2021-12-01 LAB — LIPID PANEL
Chol/HDL Ratio: 3.4 ratio (ref 0.0–4.4)
Cholesterol, Total: 150 mg/dL (ref 100–199)
HDL: 44 mg/dL (ref >39)
LDL Chol Calc (NIH): 84 mg/dL (ref 0–99)
Triglycerides: 124 mg/dL (ref 0–149)
VLDL Cholesterol Cal: 22 mg/dL (ref 5–40)

## 2021-12-01 LAB — CBC WITH DIFFERENTIAL/PLATELET
Basophils Absolute: 0 x10E3/uL (ref 0.0–0.2)
Basos: 0 %
EOS (ABSOLUTE): 0 x10E3/uL (ref 0.0–0.4)
Eos: 0 %
Hematocrit: 46.9 % — ABNORMAL HIGH (ref 34.0–46.6)
Hemoglobin: 16 g/dL — ABNORMAL HIGH (ref 11.1–15.9)
Immature Grans (Abs): 0 x10E3/uL (ref 0.0–0.1)
Immature Granulocytes: 0 %
Lymphocytes Absolute: 1.9 x10E3/uL (ref 0.7–3.1)
Lymphs: 16 %
MCH: 31.6 pg (ref 26.6–33.0)
MCHC: 34.1 g/dL (ref 31.5–35.7)
MCV: 93 fL (ref 79–97)
Monocytes Absolute: 1 x10E3/uL — ABNORMAL HIGH (ref 0.1–0.9)
Monocytes: 8 %
Neutrophils Absolute: 8.8 x10E3/uL — ABNORMAL HIGH (ref 1.4–7.0)
Neutrophils: 76 %
Platelets: 315 x10E3/uL (ref 150–450)
RBC: 5.07 x10E6/uL (ref 3.77–5.28)
RDW: 12.5 % (ref 11.7–15.4)
WBC: 11.7 x10E3/uL — ABNORMAL HIGH (ref 3.4–10.8)

## 2021-12-01 LAB — T4, FREE: Free T4: 1.36 ng/dL (ref 0.82–1.77)

## 2021-12-01 LAB — TSH: TSH: 2.15 u[IU]/mL (ref 0.450–4.500)

## 2021-12-01 LAB — HEMOGLOBIN A1C
Est. average glucose Bld gHb Est-mCnc: 120 mg/dL
Hgb A1c MFr Bld: 5.8 % — ABNORMAL HIGH (ref 4.8–5.6)

## 2021-12-03 ENCOUNTER — Ambulatory Visit
Admission: RE | Admit: 2021-12-03 | Discharge: 2021-12-03 | Disposition: A | Discharge status: Home / Self Care | Payer: Medicare HMO | Source: Ambulatory Visit | Attending: Family Medicine | Admitting: Family Medicine

## 2021-12-03 ENCOUNTER — Other Ambulatory Visit: Payer: Self-pay | Admitting: Family Medicine

## 2021-12-03 ENCOUNTER — Encounter: Payer: Self-pay | Admitting: Family Medicine

## 2021-12-03 ENCOUNTER — Ambulatory Visit
Admission: RE | Admit: 2021-12-03 | Discharge: 2021-12-03 | Disposition: A | Discharge status: Home / Self Care | Payer: Medicare HMO | Attending: Family Medicine | Admitting: Family Medicine

## 2021-12-03 DIAGNOSIS — I878 Other specified disorders of veins: Secondary | ICD-10-CM | POA: Diagnosis not present

## 2021-12-03 DIAGNOSIS — G8929 Other chronic pain: Secondary | ICD-10-CM

## 2021-12-03 DIAGNOSIS — M25551 Pain in right hip: Secondary | ICD-10-CM | POA: Diagnosis not present

## 2021-12-03 DIAGNOSIS — M47816 Spondylosis without myelopathy or radiculopathy, lumbar region: Secondary | ICD-10-CM | POA: Diagnosis not present

## 2021-12-03 DIAGNOSIS — M5441 Lumbago with sciatica, right side: Secondary | ICD-10-CM | POA: Insufficient documentation

## 2021-12-03 DIAGNOSIS — M1611 Unilateral primary osteoarthritis, right hip: Secondary | ICD-10-CM | POA: Diagnosis not present

## 2021-12-03 DIAGNOSIS — M8588 Other specified disorders of bone density and structure, other site: Secondary | ICD-10-CM | POA: Diagnosis not present

## 2021-12-03 IMAGING — CR DG HIP (WITH OR WITHOUT PELVIS) 2-3V*R*
1 series · 3 of 3 positions shown · non-contrast
Comparison: CT [DATE].

CLINICAL DATA: Chronic right-sided low back pain with right-sided
sciatica. Pain right hip. No known injury.

EXAM:
DG HIP (WITH OR WITHOUT PELVIS) 2-3V RIGHT

[Series 1: dg hip unilat w or w/o pelvis 2-3 views  · non-contrast · 0.14mm/px · 3 of 3 slices shown]
[im 1/3]
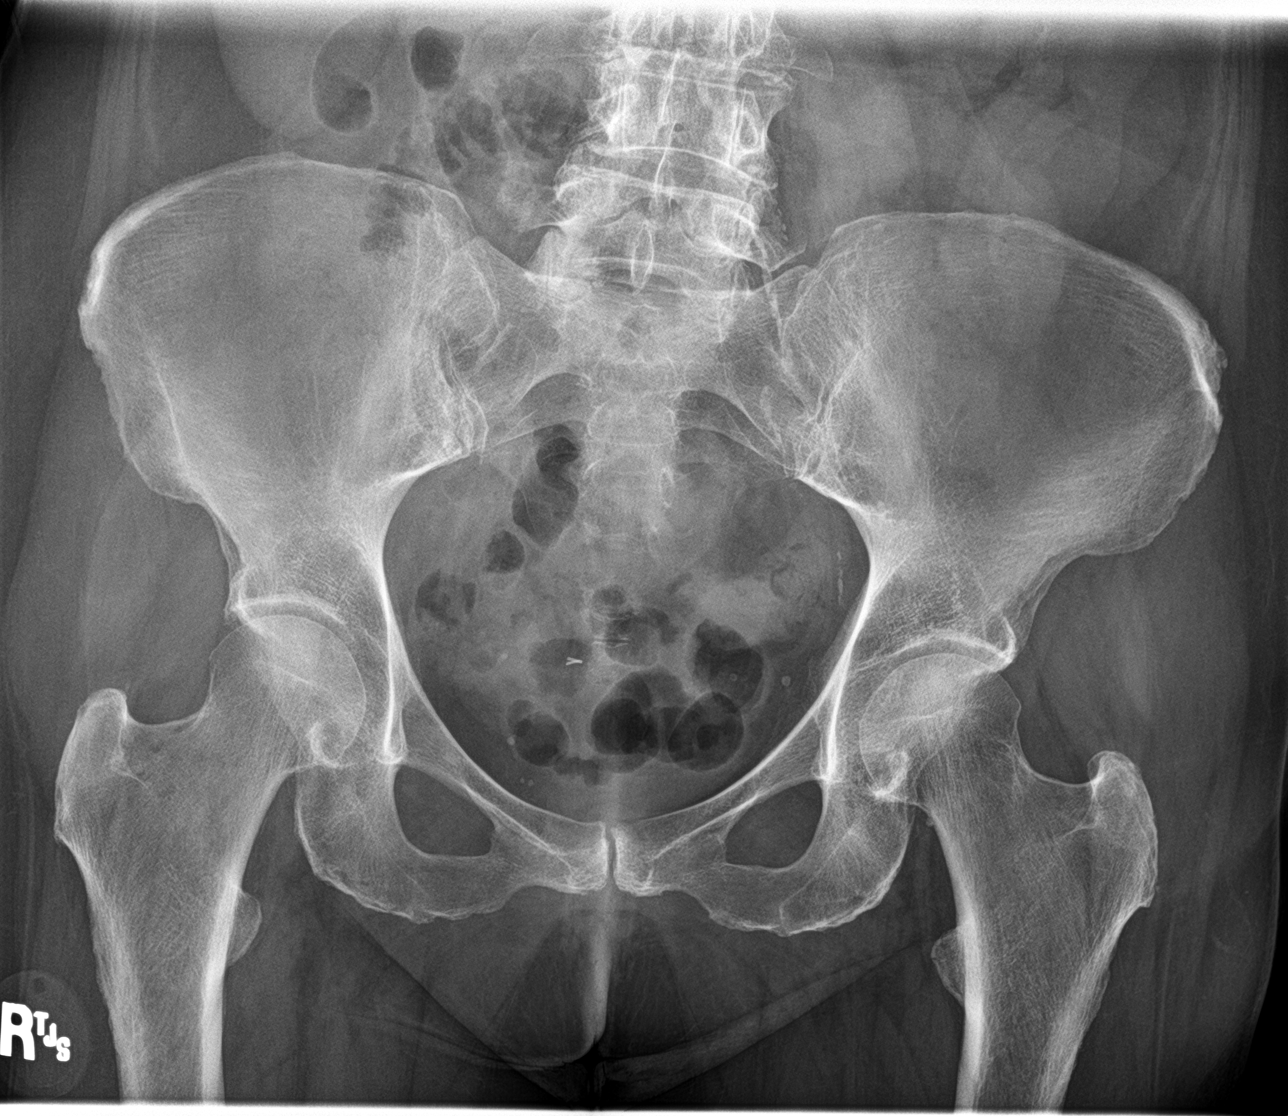
[im 2/3]
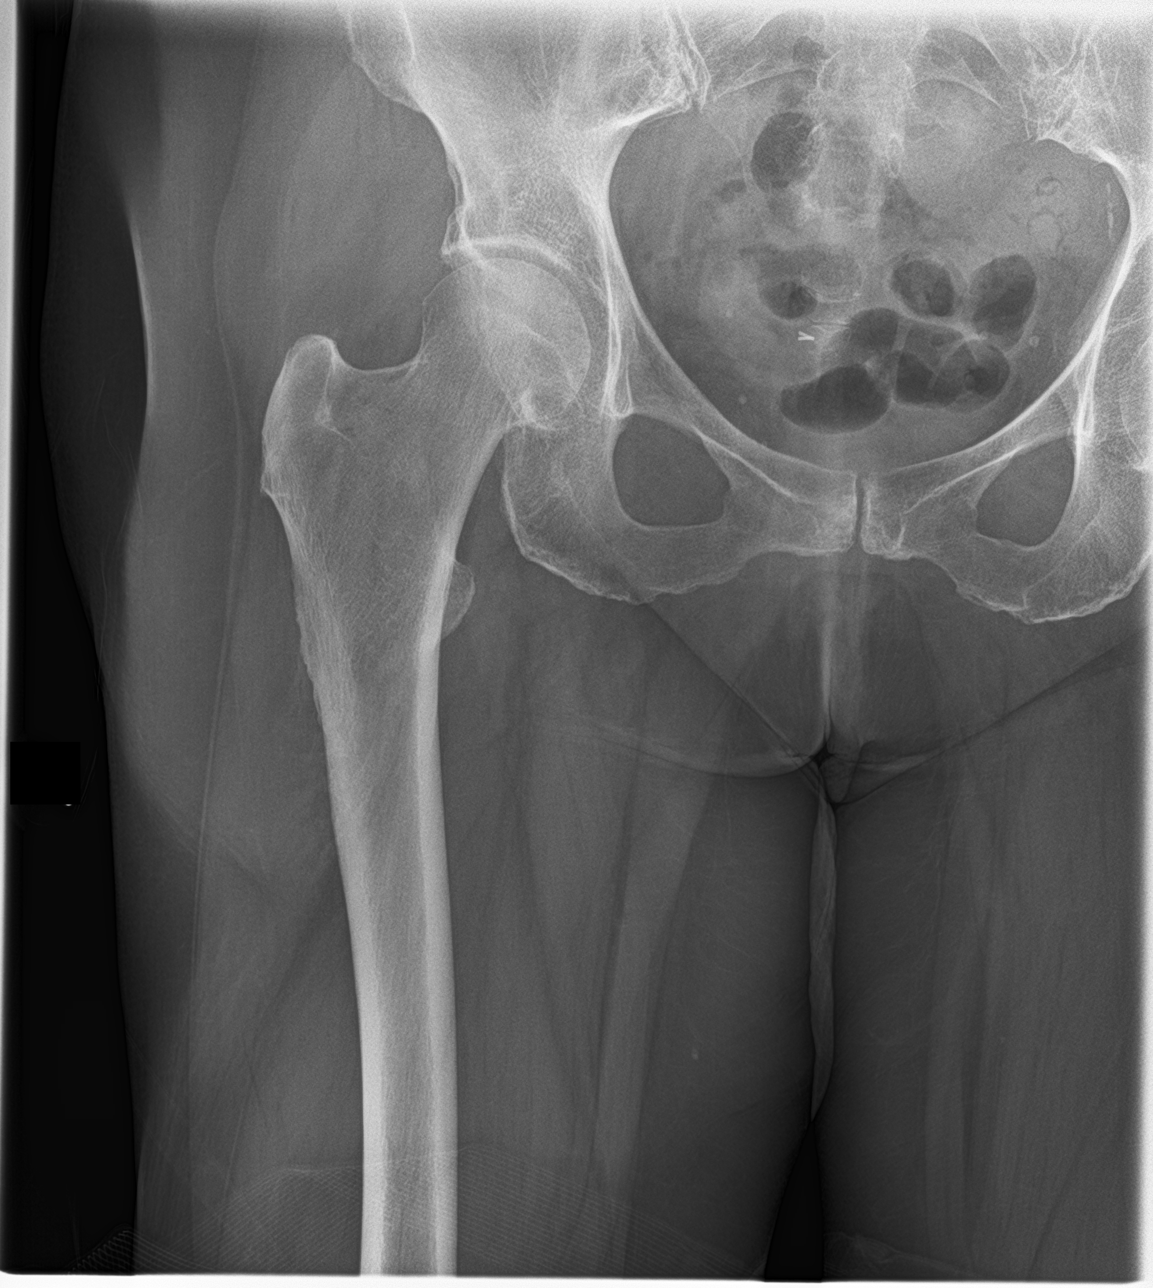
[im 3/3]
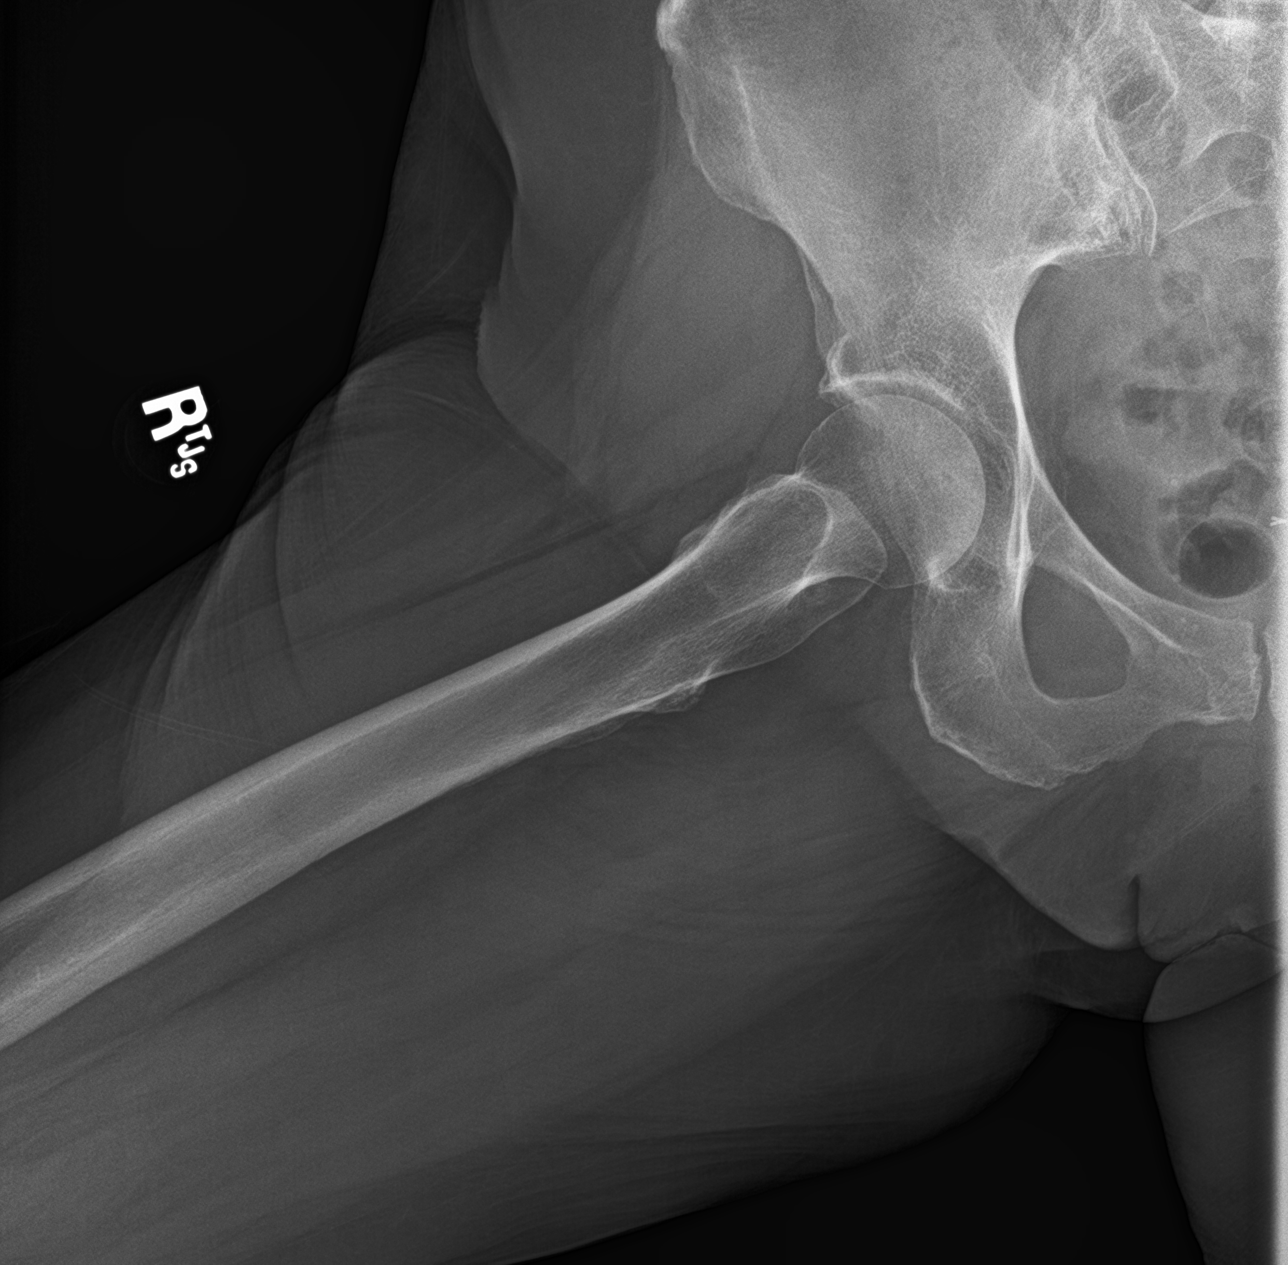

[3 of 3 positions shown; findings below may reference images not displayed]

FINDINGS: Diffuse osteopenia. Degenerative changes lumbar spine and both hips.
Faint sclerotic changes noted of both femoral heads. Although these
changes may be degenerative, bilateral avascular necrosis cannot be
excluded. MRI of the hips can be obtained to further evaluate. No
evidence of fracture or dislocation. Surgical clips noted the
pelvis. Pelvic calcifications consistent phleboliths. Aortoiliac
atherosclerotic vascular calcification.
IMPRESSION: 1. Diffuse osteopenia and degenerative change. Faint sclerotic
changes noted of both femoral heads. Although these changes may be
degenerative, bilateral avascular necrosis cannot be excluded. MRI
of the hips can be obtained to further evaluate. No evidence of
fracture or dislocation.

2.  Aortoiliac atherosclerotic vascular disease.

## 2021-12-04 ENCOUNTER — Encounter: Payer: Self-pay | Admitting: Family Medicine

## 2021-12-04 ENCOUNTER — Ambulatory Visit: Payer: Self-pay

## 2021-12-04 DIAGNOSIS — M791 Myalgia, unspecified site: Secondary | ICD-10-CM | POA: Diagnosis not present

## 2021-12-04 DIAGNOSIS — M479 Spondylosis, unspecified: Secondary | ICD-10-CM | POA: Diagnosis not present

## 2021-12-04 DIAGNOSIS — D72829 Elevated white blood cell count, unspecified: Secondary | ICD-10-CM

## 2021-12-04 DIAGNOSIS — R202 Paresthesia of skin: Secondary | ICD-10-CM | POA: Diagnosis not present

## 2021-12-04 NOTE — Telephone Encounter (Signed)
Appt feel off Sunday night had few body aches Sunday. ?BP was high  at OV due to pain ?Monday still felt like she was getting sick . Saw that WBC count is up. ?This am body aches but thermometer reading 96.6.  ?BP elevated.Covid neg. ?Digital oral thermometer.  ?No taste and feeling foggy. X 2 days ?96.1 Pt asked if she needs an appointment. ?95.6-96.1 ?Will test for covid again in am and monitor sx. Advised to call back if worsens. ?Reason for Disposition ? Non-urgent call redirected to PCP's office because it is open ? ?Protocols used: No Contact or Duplicate Contact Call-A-AH ? ?

## 2021-12-05 ENCOUNTER — Other Ambulatory Visit: Payer: Self-pay | Admitting: Family Medicine

## 2021-12-05 DIAGNOSIS — M87059 Idiopathic aseptic necrosis of unspecified femur: Secondary | ICD-10-CM

## 2021-12-06 NOTE — Addendum Note (Signed)
Addended by: Doristine Devoid on: 12/06/2021 10:19 AM   Modules accepted: Orders

## 2021-12-12 DIAGNOSIS — R202 Paresthesia of skin: Secondary | ICD-10-CM | POA: Diagnosis not present

## 2021-12-26 DIAGNOSIS — M5416 Radiculopathy, lumbar region: Secondary | ICD-10-CM | POA: Diagnosis not present

## 2021-12-27 ENCOUNTER — Ambulatory Visit
Admission: RE | Admit: 2021-12-27 | Discharge: 2021-12-27 | Disposition: A | Discharge status: Home / Self Care | Payer: Medicare HMO | Source: Ambulatory Visit | Attending: Family Medicine | Admitting: Family Medicine

## 2021-12-27 ENCOUNTER — Ambulatory Visit: Admission: RE | Admit: 2021-12-27 | Payer: Medicare HMO | Source: Ambulatory Visit

## 2021-12-27 DIAGNOSIS — M87059 Idiopathic aseptic necrosis of unspecified femur: Secondary | ICD-10-CM | POA: Insufficient documentation

## 2021-12-27 DIAGNOSIS — M1611 Unilateral primary osteoarthritis, right hip: Secondary | ICD-10-CM | POA: Diagnosis not present

## 2021-12-27 DIAGNOSIS — M1612 Unilateral primary osteoarthritis, left hip: Secondary | ICD-10-CM | POA: Diagnosis not present

## 2021-12-27 IMAGING — MR MR HIP*R* WO/W CM
8 series · 40 of 40 positions shown · IV contrast (gadavist)
Comparison: Pelvis and right hip radiographs [DATE]; CT abdomen
and pelvis [DATE]

CLINICAL DATA: Hip pain.  Osteonecrosis suspected.  X-ray done.

EXAM:
MRI OF THE RIGHT HIP WITHOUT AND WITH CONTRAST; MRI OF THE LEFT HIP
WITHOUT AND WITH CONTRAST
TECHNIQUE: Multiplanar, multisequence MR imaging was performed both before and
after administration of intravenous contrast.
CONTRAST:  7mL GADAVIST GADOBUTROL 1 MMOL/ML IV SOLN

[Series 8: T1 · coronal · B · 4.0mm · 1.19mm/px · 5 of 40 slices shown]
[im 1/40]
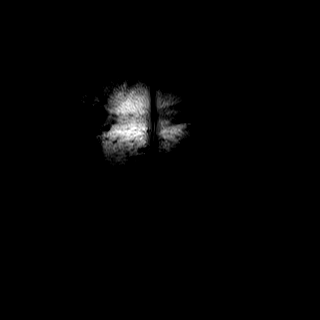
[im 10/40]
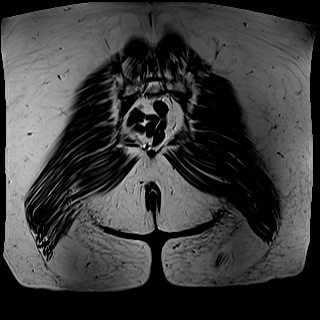
[im 20/40]
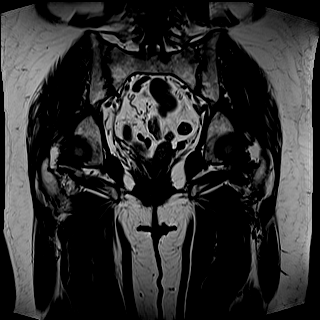
[im 30/40]
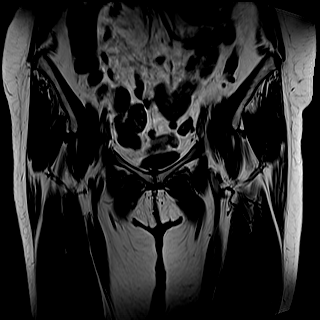
[im 40/40]
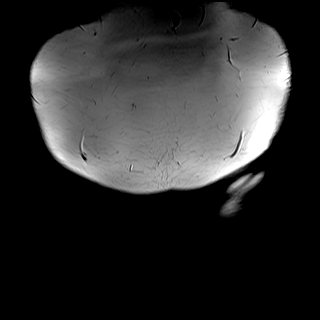

[Series 9: STIR · coronal · B · 4.0mm · 1.19mm/px · 5 of 40 slices shown]
[im 1/40]
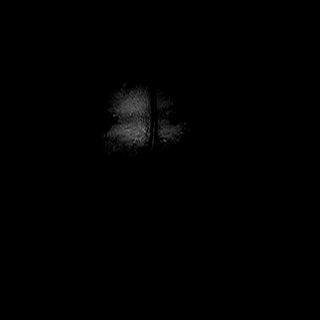
[im 10/40]
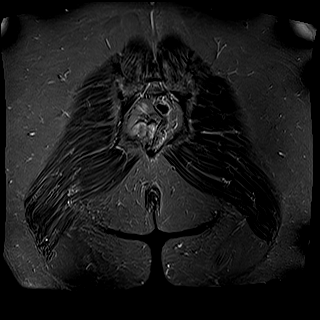
[im 20/40]
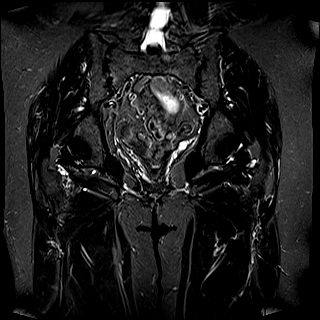
[im 30/40]
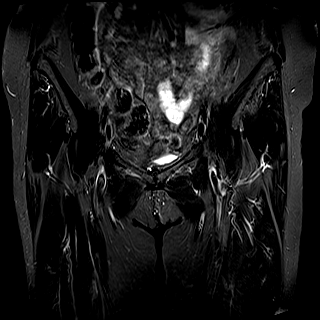
[im 40/40]
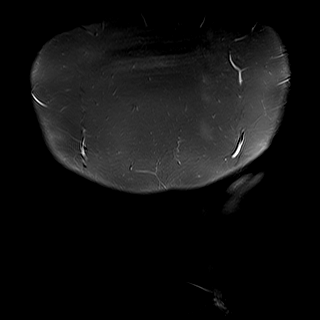

[Series 10: T2 fat-sat · axial · B · 4.0mm · 0.70mm/px · z∈[-125,+20]mm · 5 of 30 slices shown]
[im 1/30]
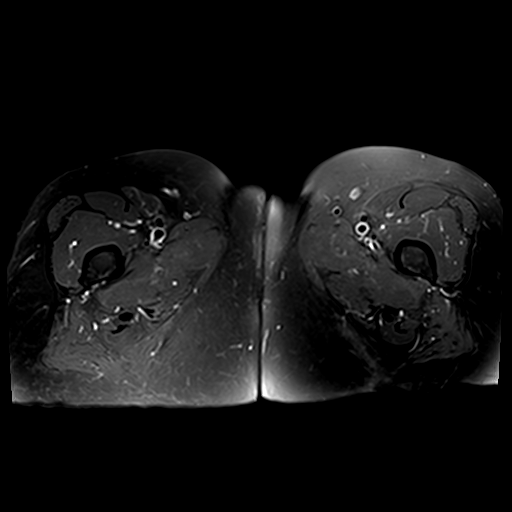
[im 8/30]
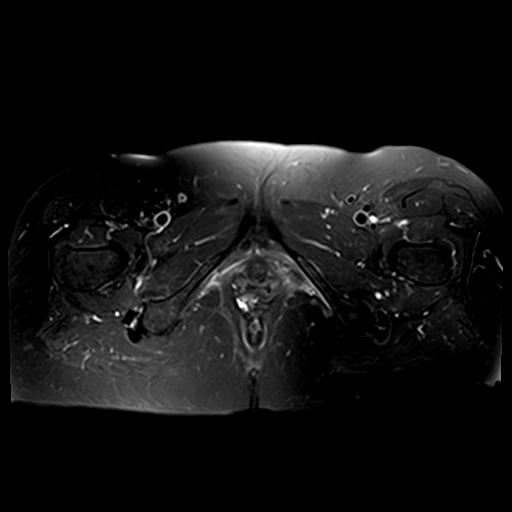
[im 15/30]
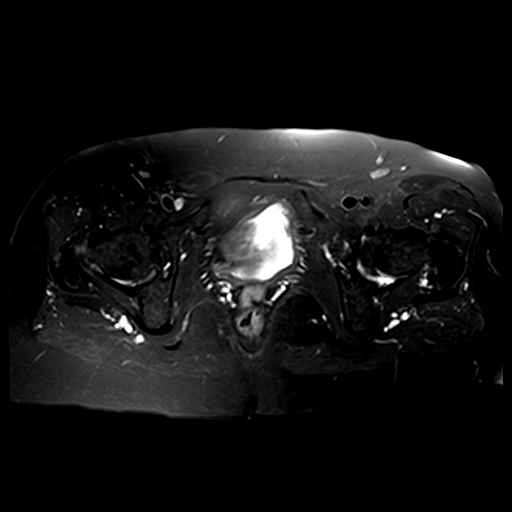
[im 22/30]
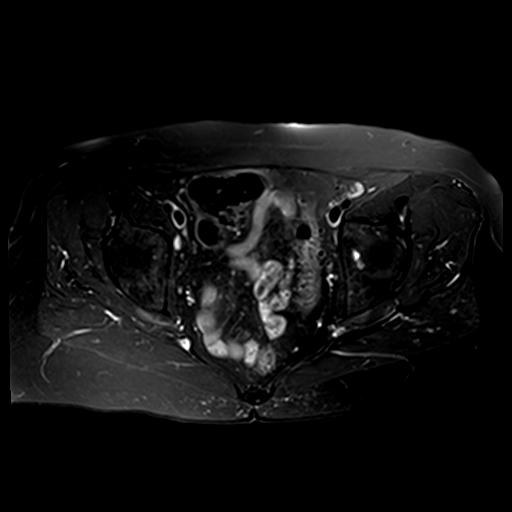
[im 30/30]
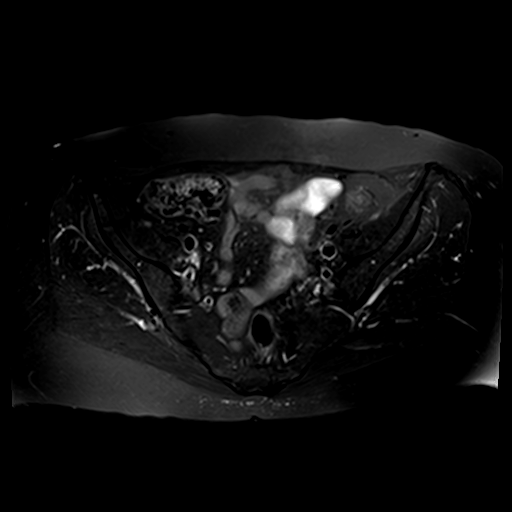

[Series 11: T1 fat-sat · axial · non-contrast · B · 4.0mm · 0.70mm/px · z∈[-127,+18]mm · 5 of 30 slices shown]
[im 1/30]
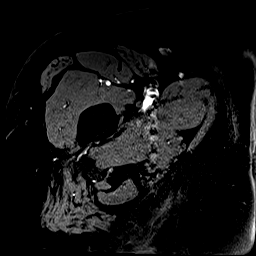
[im 8/30]
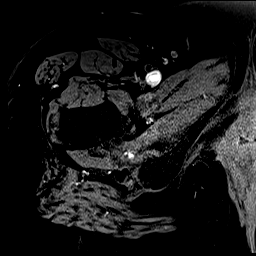
[im 15/30]
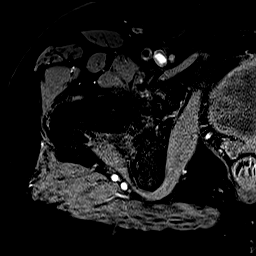
[im 22/30]
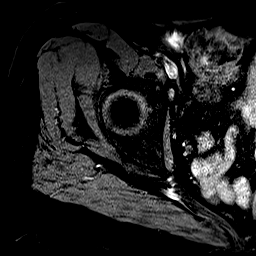
[im 30/30]
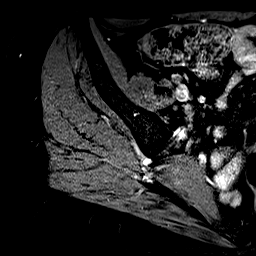

[Series 12: PD fat-sat · sagittal · B · 4.0mm · 0.70mm/px · 5 of 29 slices shown (1 of 2)]
[im 1/29]
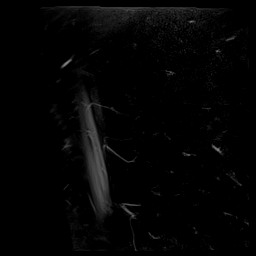
[im 8/29]
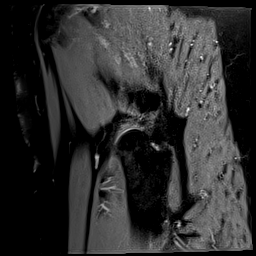
[im 15/29]
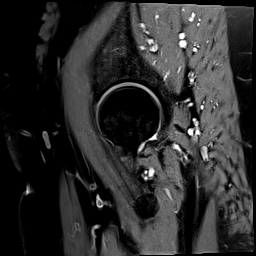
[im 22/29]
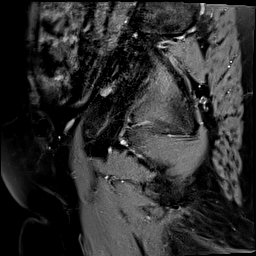
[im 29/29]
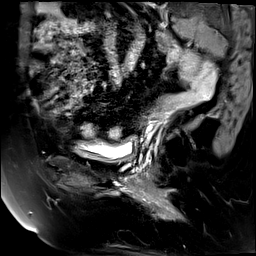

[Series 13: PD fat-sat · coronal · B · 4.0mm · 0.70mm/px · 5 of 29 slices shown (2 of 2)]
[im 1/29]
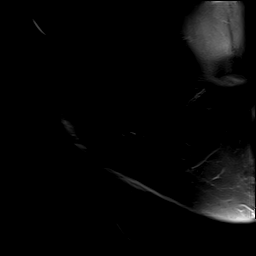
[im 8/29]
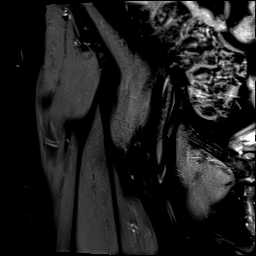
[im 15/29]
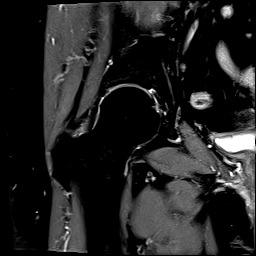
[im 22/29]
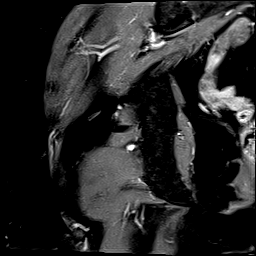
[im 29/29]
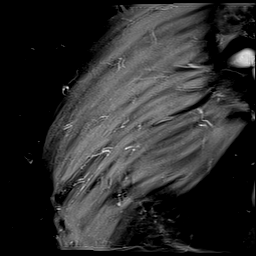

[Series 17: T1 fat-sat post-contrast · axial · B · 4.0mm · 0.70mm/px · z∈[-127,+18]mm · 5 of 30 slices shown (1 of 2)]
[im 1/30]
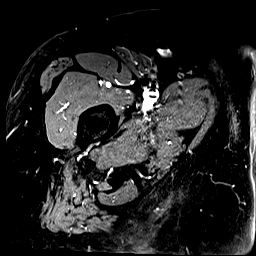
[im 8/30]
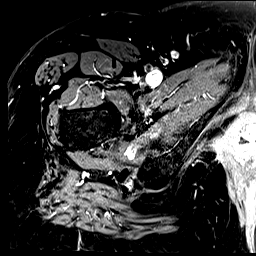
[im 15/30]
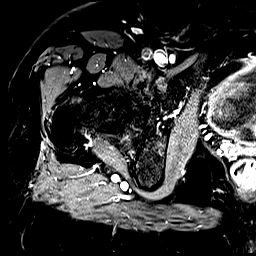
[im 22/30]
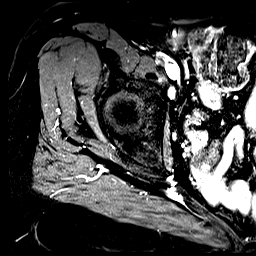
[im 30/30]
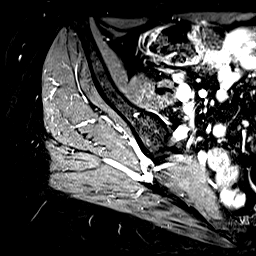

[Series 18: T1 fat-sat post-contrast · coronal · B · 4.0mm · 0.70mm/px · 5 of 29 slices shown (2 of 2)]
[im 1/29]
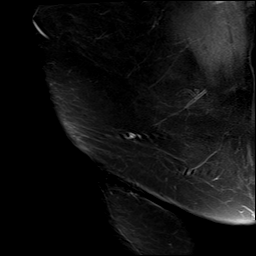
[im 8/29]
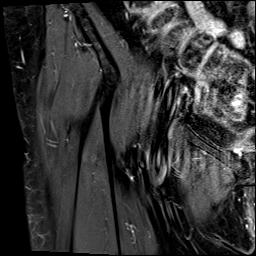
[im 15/29]
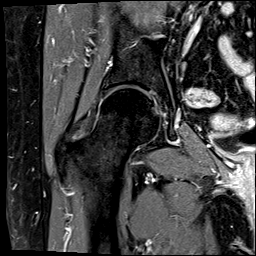
[im 22/29]
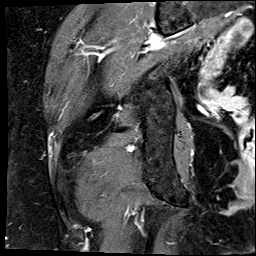
[im 29/29]
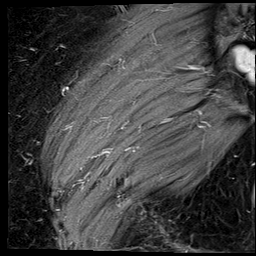

[40 of 40 positions shown; findings below may reference images not displayed]

FINDINGS: Bones: No evidence of avascular necrosis within the visualized
portion of the pelvis or either proximal femur. Mild-to-moderate
pubic symphysis joint space narrowing and peripheral osteophytosis.
No acute fracture.

Mild L4-5 disc space narrowing and decreased disc hydration.

Articular cartilage and labrum

Right hip:

Articular cartilage: Very mild anterior superior left femoral head
and mild to moderate anterosuperior left acetabular cartilage
thinning. No subchondral cystic change. Normal morphology of the
left femoral head-neck junction without significant CAM-type bump
deformity.

Labrum: Lack of intra-articular fluid limits evaluation of the left
glenoid labrum. There appears to be mild attenuation and
degenerative irregularity of the anterior superior left acetabular
labrum (left hip coronal series 16 images 11 through 13).

Left hip:

Articular cartilage: Very mild anterior superior right femoral head
and mild-to-moderate anterior superior right acetabular cartilage
thinning (sagittal series 12, image 14). No subchondral cystic
change. Normal morphology of the right femoral head-neck junction
without significant CAM-type bump deformity.

Labrum: Lack of intra-articular fluid limits evaluation of the
glenoid labrum. There appears to be mild attenuation and
degenerative fraying of the anterior superior right acetabular
labrum (coronal series 13 images 12 and 13, sagittal series 12 image
14).

Joint or bursal effusion

Joint effusion:  No joint effusion within either hip.

Bursae:  No trochanteric bursa on either side.

Muscles and tendons

Muscles and tendons: The origins of the bilateral rectus femoris
tendons and the bilateral common hamstring tendon origins are
intact. The insertions of the bilateral gluteus minimus gluteus
medius and iliopsoas tendons are intact.

Other findings

Miscellaneous: The uterus appears to be surgically absent. There is
mild-to-moderate sigmoid diverticulosis.

No suspicious bone or soft tissue enhancement is seen.
IMPRESSION: 1. Bilateral femoroacetabular very mild anterior superior femoral
head and mild-to-moderate anterior superior acetabular cartilage
thinning.
2. Mild bilateral anterior superior acetabular attenuation and
degenerative irregularity.
3. No evidence of avascular necrosis, with attention to the
bilateral femoral heads.

## 2021-12-27 IMAGING — MR MR HIP*L* WO/W CM
8 series · 40 of 40 positions shown · IV contrast (gadavist)
Comparison: Pelvis and right hip radiographs [DATE]; CT abdomen
and pelvis [DATE]

CLINICAL DATA: Hip pain.  Osteonecrosis suspected.  X-ray done.

EXAM:
MRI OF THE RIGHT HIP WITHOUT AND WITH CONTRAST; MRI OF THE LEFT HIP
WITHOUT AND WITH CONTRAST
TECHNIQUE: Multiplanar, multisequence MR imaging was performed both before and
after administration of intravenous contrast.
CONTRAST:  7mL GADAVIST GADOBUTROL 1 MMOL/ML IV SOLN

[Series 8: T1 · coronal · B · 4.0mm · 1.19mm/px · 5 of 40 slices shown]
[im 1/40]
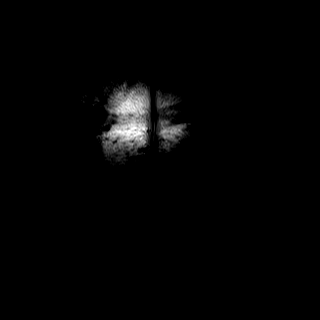
[im 10/40]
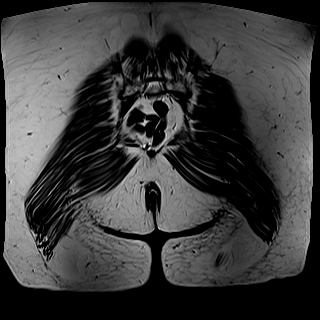
[im 20/40]
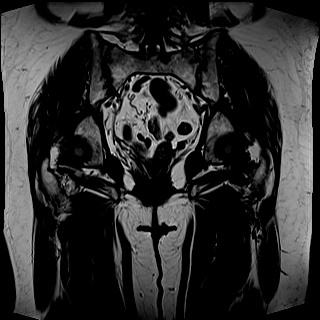
[im 30/40]
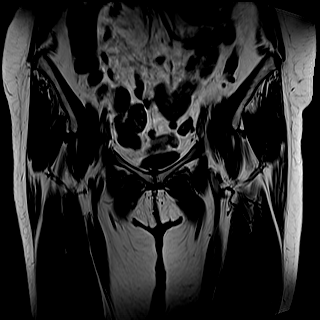
[im 40/40]
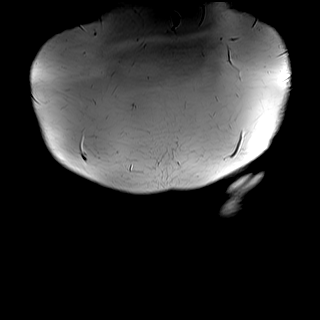

[Series 9: STIR · coronal · B · 4.0mm · 1.19mm/px · 5 of 40 slices shown]
[im 1/40]
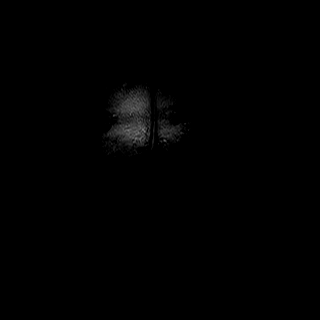
[im 10/40]
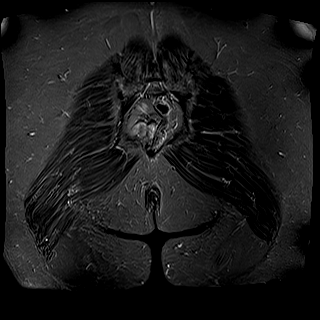
[im 20/40]
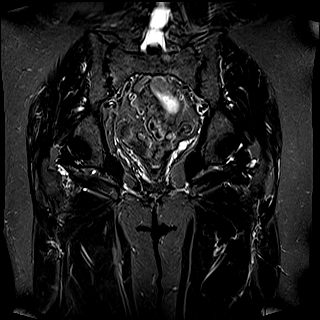
[im 30/40]
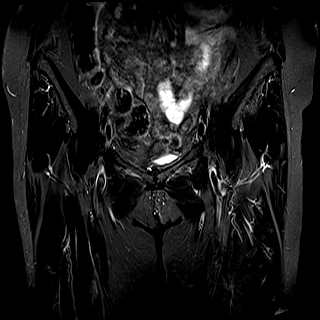
[im 40/40]
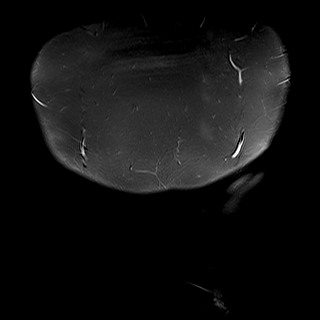

[Series 10: T2 fat-sat · axial · B · 4.0mm · 0.70mm/px · z∈[-125,+20]mm · 5 of 30 slices shown]
[im 1/30]
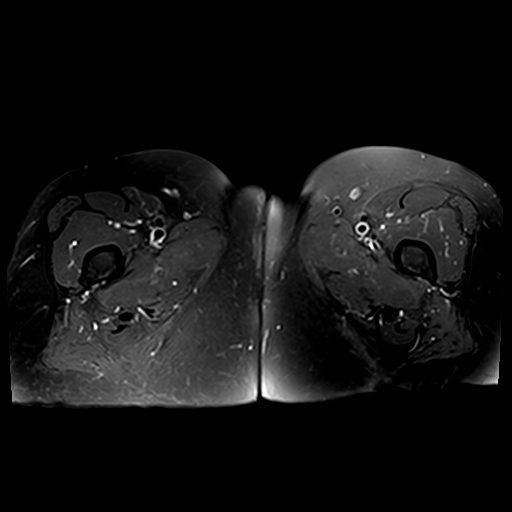
[im 8/30]
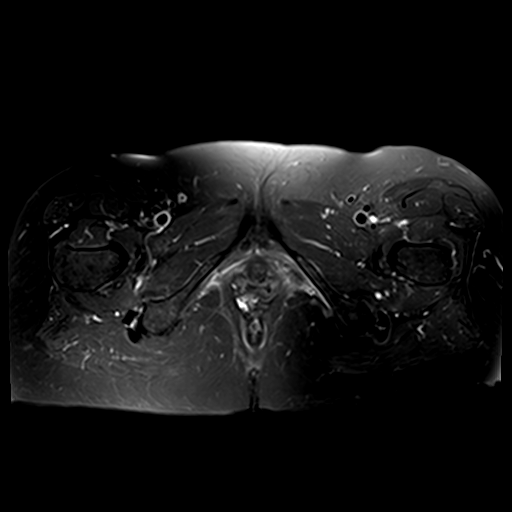
[im 15/30]
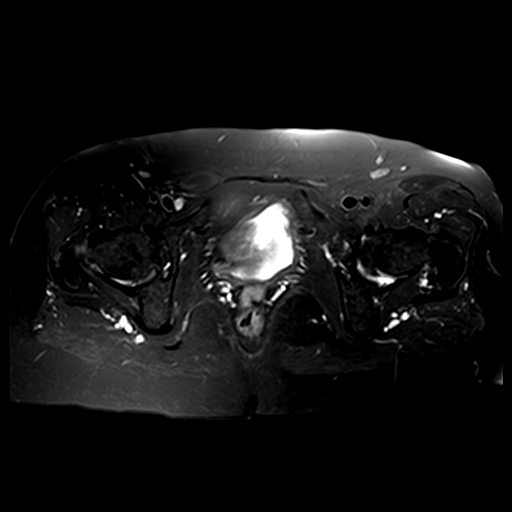
[im 22/30]
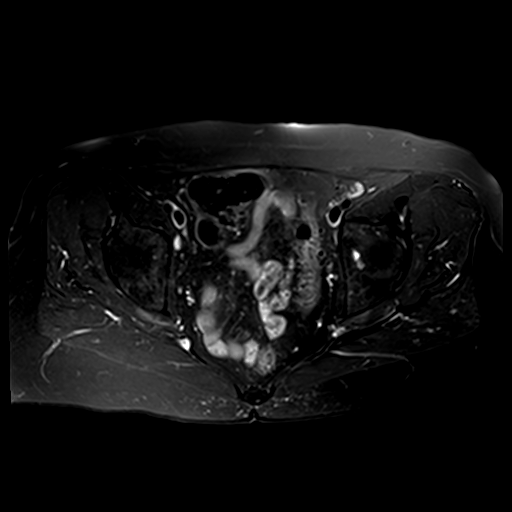
[im 30/30]
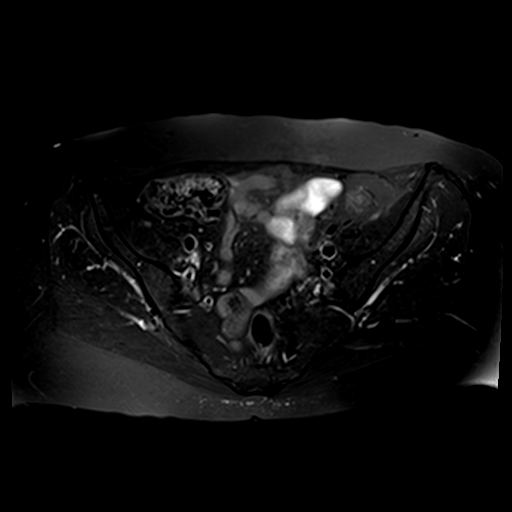

[Series 14: T1 fat-sat · axial · non-contrast · B · 4.0mm · 0.70mm/px · z∈[-132,+13]mm · 5 of 30 slices shown]
[im 1/30]
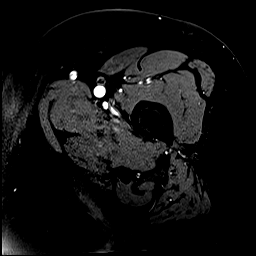
[im 8/30]
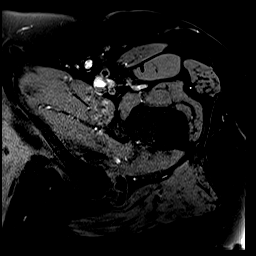
[im 15/30]
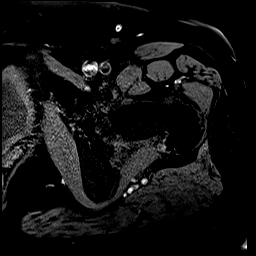
[im 22/30]
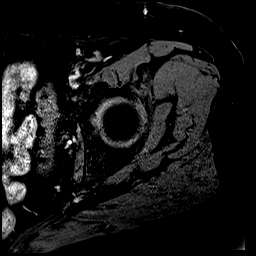
[im 30/30]
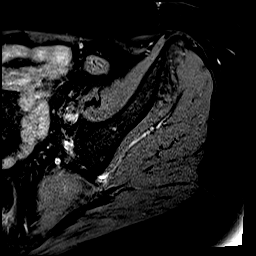

[Series 15: PD fat-sat · sagittal · B · 4.0mm · 0.70mm/px · 5 of 29 slices shown (1 of 2)]
[im 1/29]
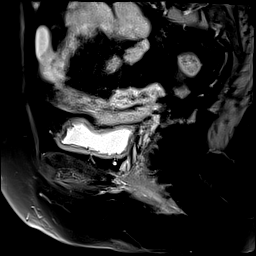
[im 8/29]
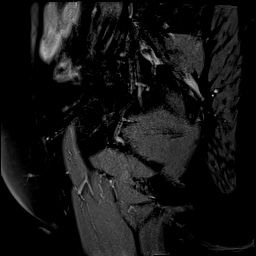
[im 15/29]
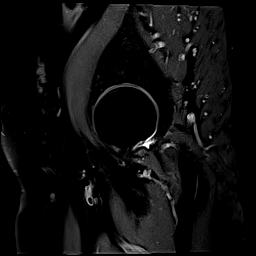
[im 22/29]
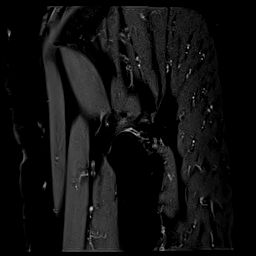
[im 29/29]
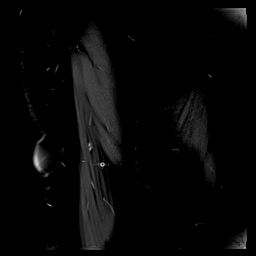

[Series 16: PD fat-sat · coronal · B · 4.0mm · 0.70mm/px · 5 of 29 slices shown (2 of 2)]
[im 1/29]
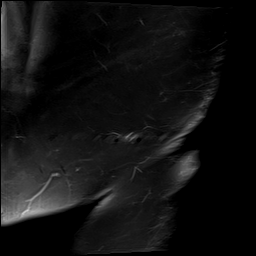
[im 8/29]
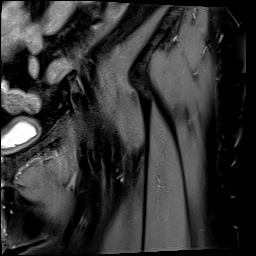
[im 15/29]
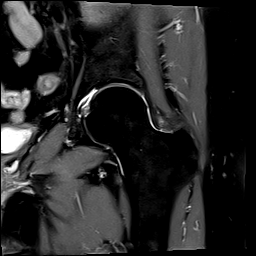
[im 22/29]
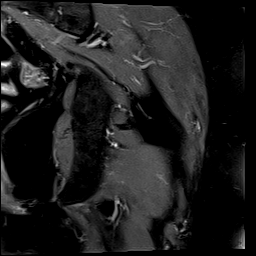
[im 29/29]
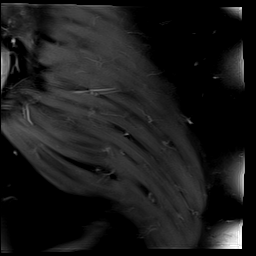

[Series 19: T1 fat-sat post-contrast · axial · B · 4.0mm · 0.70mm/px · z∈[-132,+13]mm · 5 of 30 slices shown (1 of 2)]
[im 1/30]
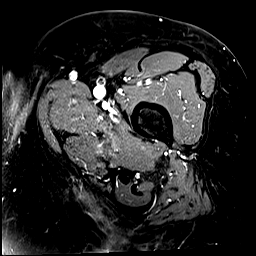
[im 8/30]
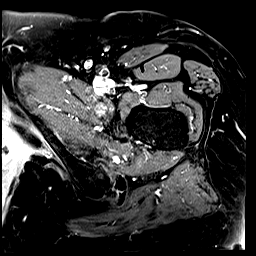
[im 15/30]
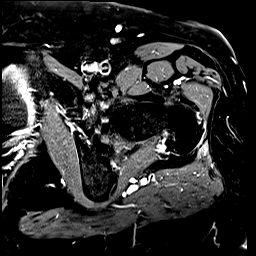
[im 22/30]
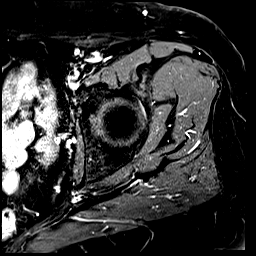
[im 30/30]
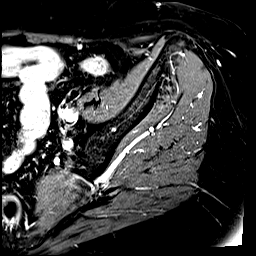

[Series 20: T1 fat-sat post-contrast · coronal · B · 3.0mm · 0.70mm/px · 5 of 29 slices shown (2 of 2)]
[im 1/29]
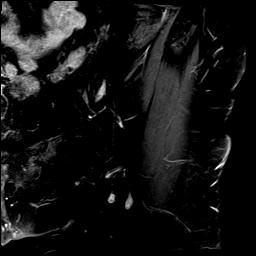
[im 8/29]
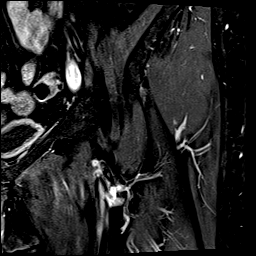
[im 15/29]
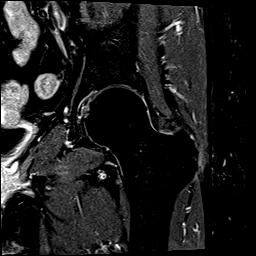
[im 22/29]
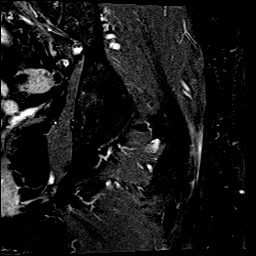
[im 29/29]
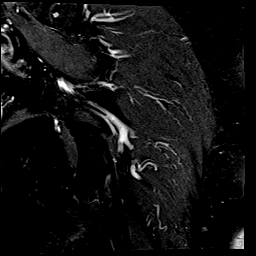

[40 of 40 positions shown; findings below may reference images not displayed]

FINDINGS: Bones: No evidence of avascular necrosis within the visualized
portion of the pelvis or either proximal femur. Mild-to-moderate
pubic symphysis joint space narrowing and peripheral osteophytosis.
No acute fracture.

Mild L4-5 disc space narrowing and decreased disc hydration.

Articular cartilage and labrum

Right hip:

Articular cartilage: Very mild anterior superior left femoral head
and mild to moderate anterosuperior left acetabular cartilage
thinning. No subchondral cystic change. Normal morphology of the
left femoral head-neck junction without significant CAM-type bump
deformity.

Labrum: Lack of intra-articular fluid limits evaluation of the left
glenoid labrum. There appears to be mild attenuation and
degenerative irregularity of the anterior superior left acetabular
labrum (left hip coronal series 16 images 11 through 13).

Left hip:

Articular cartilage: Very mild anterior superior right femoral head
and mild-to-moderate anterior superior right acetabular cartilage
thinning (sagittal series 12, image 14). No subchondral cystic
change. Normal morphology of the right femoral head-neck junction
without significant CAM-type bump deformity.

Labrum: Lack of intra-articular fluid limits evaluation of the
glenoid labrum. There appears to be mild attenuation and
degenerative fraying of the anterior superior right acetabular
labrum (coronal series 13 images 12 and 13, sagittal series 12 image
14).

Joint or bursal effusion

Joint effusion:  No joint effusion within either hip.

Bursae:  No trochanteric bursa on either side.

Muscles and tendons

Muscles and tendons: The origins of the bilateral rectus femoris
tendons and the bilateral common hamstring tendon origins are
intact. The insertions of the bilateral gluteus minimus gluteus
medius and iliopsoas tendons are intact.

Other findings

Miscellaneous: The uterus appears to be surgically absent. There is
mild-to-moderate sigmoid diverticulosis.

No suspicious bone or soft tissue enhancement is seen.
IMPRESSION: 1. Bilateral femoroacetabular very mild anterior superior femoral
head and mild-to-moderate anterior superior acetabular cartilage
thinning.
2. Mild bilateral anterior superior acetabular attenuation and
degenerative irregularity.
3. No evidence of avascular necrosis, with attention to the
bilateral femoral heads.

## 2021-12-27 MED ORDER — GADOBUTROL 1 MMOL/ML IV SOLN
7.0000 mL | Freq: Once | INTRAVENOUS | Status: AC | PRN
  Administered 2021-12-27: 7 mL via INTRAVENOUS

## 2021-12-30 NOTE — Progress Notes (Signed)
Reassuring MRI -no evidence of joint/bone death, necrosis -some slight arthritis and narrowing within the hip and pelvis -mild to moderate cartilage changes noted at the head of the femur.  I would continue to recommend Ortho follow up.  Gwyneth Sprout, [address] [place], [place] [address] [phone] (phone) [phone] (fax) [place]

## 2021-12-31 ENCOUNTER — Other Ambulatory Visit: Payer: Self-pay

## 2021-12-31 ENCOUNTER — Emergency Department
Admission: EM | Admit: 2021-12-31 | Discharge: 2021-12-31 | Disposition: A | Discharge status: Home / Self Care | Payer: Medicare HMO | Attending: Emergency Medicine | Admitting: Emergency Medicine

## 2021-12-31 DIAGNOSIS — M545 Low back pain, unspecified: Secondary | ICD-10-CM | POA: Insufficient documentation

## 2021-12-31 MED ORDER — KETOROLAC TROMETHAMINE 30 MG/ML IJ SOLN
30.0000 mg | Freq: Once | INTRAMUSCULAR | Status: DC
  Filled 2021-12-31: qty 1

## 2021-12-31 MED ORDER — METHYLPREDNISOLONE SODIUM SUCC 125 MG IJ SOLR
125.0000 mg | Freq: Once | INTRAMUSCULAR | Status: DC
  Filled 2021-12-31: qty 2

## 2021-12-31 NOTE — ED Provider Notes (Signed)
[facility] Provider Note  Patient Contact: 9:25 PM (approximate)   History   Back Pain   HPI  Katelyn Bartlett is a 66 y.o. female presents to the emergency department with persistent low back pain for the past 6 weeks.  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  Patient has an appointment with neurosurgeon Dr. Trenton Gammon next Wednesday      Physical Exam   Triage Vital Signs: ED Triage Vitals [12/31/21 1650]  Enc Vitals Group     BP 137/75     Pulse Rate 94     Resp 18     Temp 98 F (36.7 C)     Temp Source Oral     SpO2 96 %     Weight      Height 5' 6"  (1.676 m)     Head Circumference      Peak Flow      Pain Score 10     Pain Loc      Pain Edu?      Excl. in [place]?     Most recent vital signs: Vitals:   12/31/21 1650  BP: 137/75  Pulse: 94  Resp: 18  Temp: 98 F (36.7 C)  SpO2: 96%     General: Alert and in no acute distress. Eyes:  PERRL. EOMI. Head: No acute traumatic findings ENT:      Nose: No congestion/rhinnorhea.      Mouth/Throat: Mucous membranes are moist. Neck: No stridor. No cervical spine tenderness to palpation. Cardiovascular:  Good peripheral perfusion Respiratory: Normal respiratory effort without tachypnea or retractions. Lungs CTAB. Good air entry to the bases with no decreased or absent breath sounds. Gastrointestinal: Bowel sounds 4 quadrants. Soft and nontender to palpation. No guarding or rigidity. No palpable masses. No distention. No CVA tenderness. Musculoskeletal: Full range of motion to all extremities.  Neurologic:  No gross focal neurologic deficits are appreciated.  Skin:   No rash noted Other:   ED Results / Procedures / Treatments   Labs (all labs ordered are listed, but only abnormal results are displayed) Labs Reviewed - No data to display     PROCEDURES:  Critical Care performed:  No  Procedures   MEDICATIONS ORDERED IN ED: Medications  methylPREDNISolone sodium succinate (SOLU-MEDROL) 125 mg/2 mL injection 125 mg (has no administration in time range)  ketorolac (TORADOL) 30 MG/ML injection 30 mg (has no administration in time range)     IMPRESSION / MDM / ASSESSMENT AND PLAN / ED COURSE  I reviewed the triage vital signs and the nursing notes.                              Assessment and plan Low back pain 66 year old female presents to the emergency department with persistent low back pain for the past 6 weeks.  Vital signs are reassuring at triage.  On physical exam, patient seemed very uncomfortable.  I reviewed MRI lumbar spine results which indicated this herniation 3 L4 and L4-L5.  I offered patient Toradol and Solu-Medrol until patient can be evaluated by neurosurgery next week.  Patient left the emergency department prior to medication administration.      FINAL CLINICAL IMPRESSION(S) / ED DIAGNOSES   Final diagnoses:  Acute low back pain, unspecified back pain laterality, unspecified whether sciatica present     Rx /  DC Orders   ED Discharge Orders     None        Note:  This document was prepared using Dragon voice recognition software and may include unintentional dictation errors.   Vallarie Mare [place], Hershal Coria 12/31/21 2133    Delman Kitten, MD 12/31/21 2251

## 2021-12-31 NOTE — ED Notes (Signed)
Pt not in room to administer medication. Attempted to call listed phone number without answer.

## 2021-12-31 NOTE — ED Triage Notes (Signed)
Patient to ER via [facility], reports lower back pain for six weeks. States she had an epidural injection 3 days ago which initially helped but states pain is now back and worse. Reports she had an MRI which shows she has L3/4/5 herniated discs. States pain radiates into right leg and foot.   Patient in significant discomfort in triage.

## 2021-12-31 NOTE — Progress Notes (Signed)
Spoke with patient, she stated her understanding.

## 2022-01-08 DIAGNOSIS — M479 Spondylosis, unspecified: Secondary | ICD-10-CM | POA: Diagnosis not present

## 2022-01-08 DIAGNOSIS — M5416 Radiculopathy, lumbar region: Secondary | ICD-10-CM | POA: Diagnosis not present

## 2022-01-08 DIAGNOSIS — M791 Myalgia, unspecified site: Secondary | ICD-10-CM | POA: Diagnosis not present

## 2022-01-09 DIAGNOSIS — M5416 Radiculopathy, lumbar region: Secondary | ICD-10-CM | POA: Diagnosis not present

## 2022-01-09 DIAGNOSIS — M4156 Other secondary scoliosis, lumbar region: Secondary | ICD-10-CM | POA: Diagnosis not present

## 2022-01-17 DIAGNOSIS — D72829 Elevated white blood cell count, unspecified: Secondary | ICD-10-CM | POA: Diagnosis not present

## 2022-01-18 ENCOUNTER — Other Ambulatory Visit: Payer: Self-pay | Admitting: Family Medicine

## 2022-01-18 ENCOUNTER — Encounter: Payer: Self-pay | Admitting: Family Medicine

## 2022-01-18 DIAGNOSIS — D72829 Elevated white blood cell count, unspecified: Secondary | ICD-10-CM

## 2022-01-18 LAB — BASIC METABOLIC PANEL WITH GFR
BUN/Creatinine Ratio: 19 (ref 12–28)
BUN: 14 mg/dL (ref 8–27)
CO2: 23 mmol/L (ref 20–29)
Calcium: 9.7 mg/dL (ref 8.7–10.3)
Chloride: 103 mmol/L (ref 96–106)
Creatinine, Ser: 0.74 mg/dL (ref 0.57–1.00)
Glucose: 103 mg/dL — ABNORMAL HIGH (ref 70–99)
Potassium: 4.2 mmol/L (ref 3.5–5.2)
Sodium: 141 mmol/L (ref 134–144)
eGFR: 89 mL/min/1.73 (ref >59)

## 2022-01-24 DIAGNOSIS — D72829 Elevated white blood cell count, unspecified: Secondary | ICD-10-CM | POA: Diagnosis not present

## 2022-01-25 DIAGNOSIS — M461 Sacroiliitis, not elsewhere classified: Secondary | ICD-10-CM | POA: Diagnosis not present

## 2022-01-25 DIAGNOSIS — M47816 Spondylosis without myelopathy or radiculopathy, lumbar region: Secondary | ICD-10-CM | POA: Diagnosis not present

## 2022-01-25 LAB — CBC WITH DIFFERENTIAL/PLATELET
Basophils Absolute: 0.1 10*3/uL (ref 0.0–0.2)
Basos: 1 %
EOS (ABSOLUTE): 0.2 10*3/uL (ref 0.0–0.4)
Eos: 2 %
Hematocrit: 45.6 % (ref 34.0–46.6)
Hemoglobin: 15.7 g/dL (ref 11.1–15.9)
Immature Grans (Abs): 0.1 10*3/uL (ref 0.0–0.1)
Immature Granulocytes: 1 %
Lymphocytes Absolute: 2.2 10*3/uL (ref 0.7–3.1)
Lymphs: 28 %
MCH: 31.3 pg (ref 26.6–33.0)
MCHC: 34.4 g/dL (ref 31.5–35.7)
MCV: 91 fL (ref 79–97)
Monocytes Absolute: 0.7 10*3/uL (ref 0.1–0.9)
Monocytes: 9 %
Neutrophils Absolute: 4.7 10*3/uL (ref 1.4–7.0)
Neutrophils: 59 %
Platelets: 286 10*3/uL (ref 150–450)
RBC: 5.01 x10E6/uL (ref 3.77–5.28)
RDW: 12.9 % (ref 11.7–15.4)
WBC: 7.9 10*3/uL (ref 3.4–10.8)

## 2022-01-25 NOTE — Progress Notes (Signed)
Debbie, cell counts have improved. No longer have infection cells/WBCs, neutrophils and monocytes and red cells, hemoglobin and hematocrit have also stabilized.  Gwyneth Sprout, [address] [place], [place] [address] [phone] (phone) [phone] (fax) [place]

## 2022-01-31 NOTE — Progress Notes (Signed)
I,Sulibeya S Dimas,acting as a Education administrator for Gwyneth Sprout, FNP.,have documented all relevant documentation on the behalf of Gwyneth Sprout, FNP,as directed by  Gwyneth Sprout, FNP while in the presence of Gwyneth Sprout, FNP.   Established patient visit   Patient: Katelyn Bartlett   DOB: 1956/01/21   66 y.o. Female  MRN: [id number] Visit Date: 02/05/2022  Today's healthcare provider: Gwyneth Sprout, FNP  Re Introduced to nurse practitioner role and practice setting.  All questions answered.  Discussed provider/patient relationship and expectations.   Chief Complaint  Patient presents with   Hernia   Subjective    HPI  Patient here today C/O hernia growing in size and having problems after eating. Patient reports her upper abdomen area feels bloated and nauseated.   Patient reports lump on her left side of abdomen area.   Medications: Outpatient Medications Prior to Visit  Medication Sig   amLODipine (NORVASC) 5 MG tablet Take 1 tablet (5 mg total) by mouth daily.   aspirin EC 81 MG tablet Take 81 mg by mouth daily.   atorvastatin (LIPITOR) 40 MG tablet Take 1 tablet (40 mg total) by mouth daily.   gabapentin (NEURONTIN) 100 MG capsule Take 300 mg by mouth 3 (three) times daily.   metoprolol tartrate (LOPRESSOR) 25 MG tablet Take 1 tablet (25 mg total) by mouth 2 (two) times daily.   MULTIPLE VITAMIN PO Take 1 tablet by mouth daily.   No facility-administered medications prior to visit.    Review of Systems  Constitutional:  Negative for appetite change, chills, fatigue, fever and unexpected weight change.  Respiratory:  Negative for chest tightness and shortness of breath.   Cardiovascular:  Positive for leg swelling. Negative for chest pain and palpitations.  Gastrointestinal:  Positive for abdominal distention, diarrhea and nausea. Negative for blood in stool, constipation and vomiting.  Genitourinary:  Negative for dysuria and hematuria.       Objective    BP 134/64  (BP Location: Right Arm, Patient Position: Sitting, Cuff Size: Large)   Pulse 66   Temp 98 F (36.7 C) (Oral)   Resp 16   Wt 165 lb (74.8 kg)   BMI 26.63 kg/m    Physical Exam Vitals and nursing note reviewed.  Constitutional:      General: She is not in acute distress.    Appearance: Normal appearance. She is overweight. She is not ill-appearing, toxic-appearing or diaphoretic.  HENT:     Head: Normocephalic and atraumatic.  Cardiovascular:     Rate and Rhythm: Normal rate and regular rhythm.     Pulses: Normal pulses.     Heart sounds: Normal heart sounds. No murmur heard.    No friction rub. No gallop.  Pulmonary:     Effort: Pulmonary effort is normal. No respiratory distress.     Breath sounds: Normal breath sounds. No stridor. No wheezing, rhonchi or rales.  Chest:     Chest wall: No tenderness.  Abdominal:     General: Bowel sounds are normal. There is distension.     Palpations: Abdomen is soft.     Tenderness: There is abdominal tenderness.     Hernia: A hernia is present.  Musculoskeletal:        General: No swelling, tenderness, deformity or signs of injury. Normal range of motion.     Right lower leg: No edema.     Left lower leg: No edema.  Skin:    General:  Skin is warm and dry.     Capillary Refill: Capillary refill takes less than 2 seconds.     Coloration: Skin is not jaundiced or pale.     Findings: No bruising, erythema, lesion or rash.  Neurological:     General: No focal deficit present.     Mental Status: She is alert and oriented to person, place, and time. Mental status is at baseline.     Cranial Nerves: No cranial nerve deficit.     Sensory: No sensory deficit.     Motor: No weakness.     Coordination: Coordination normal.  Psychiatric:        Mood and Affect: Mood normal.        Behavior: Behavior normal.        Thought Content: Thought content normal.        Judgment: Judgment normal.      No results found for any visits on  02/05/22.  Assessment & Plan     Problem List Items Addressed This Visit       Digestive   Celiac disease    Chronic, acute on chronic exacerbations Does not eat during the day at work d/t GI complaints Eats 3 meals on the weekends Previously seen by GI, Vanga, and completed colonoscopy Previously have discussed other dietary treatments including use of FOD-MAP to assist       Relevant Orders   Ambulatory referral to General Surgery   Gastroesophageal reflux disease with esophagitis without hemorrhage    Chronic, not treated Worse on R side at night Wakes with vomiting into mouth Has taken PPI, Prilosec, before however, never long term Reports that Prilosec can assist with post-prandial GI complaints and bloating; explained pharmacodynamics of medication to patient and recommend small, bland foods and frequent meals/snacks Trial of medication daily for 3 months Patient wishes to defer return to GI at this time       Relevant Medications   omeprazole (PRILOSEC) 20 MG capsule   Other Relevant Orders   Ambulatory referral to General Surgery     Other   Cigarette smoker    Chronic, stable Remains pre-contemplative about quitting       Relevant Orders   Ambulatory referral to General Surgery   Generalized abdominal pain - Primary    Complaints of epigastric pain and LUQ pain Epigastric pain has worsened and LUQ pain is new Declines referral to gastric at this time       Relevant Orders   DG Abd 2 Views   Ambulatory referral to General Surgery   Ventral hernia without obstruction or gangrene    Small ventral hernia seen on previous imaging; pt complaint that abdominal distention has worsened and exacerbated GERD Denies changes in bowel/stool habits/patterns      Relevant Orders   Ambulatory referral to General Surgery     Return if symptoms worsen or fail to improve.      Vonna Kotyk, FNP, have reviewed all documentation for this visit. The documentation  on 02/05/22 for the exam, diagnosis, procedures, and orders are all accurate and complete.    Gwyneth Sprout, [place] [phone] (phone) [phone] (fax)  [place]

## 2022-02-05 ENCOUNTER — Encounter: Payer: Self-pay | Admitting: Family Medicine

## 2022-02-05 ENCOUNTER — Ambulatory Visit (INDEPENDENT_AMBULATORY_CARE_PROVIDER_SITE_OTHER): Payer: Medicare HMO | Admitting: Family Medicine

## 2022-02-05 VITALS — BP 134/64 | HR 66 | Temp 98.0°F | Resp 16 | Wt 165.0 lb

## 2022-02-05 DIAGNOSIS — K900 Celiac disease: Secondary | ICD-10-CM

## 2022-02-05 DIAGNOSIS — R1084 Generalized abdominal pain: Secondary | ICD-10-CM

## 2022-02-05 DIAGNOSIS — K439 Ventral hernia without obstruction or gangrene: Secondary | ICD-10-CM | POA: Diagnosis not present

## 2022-02-05 DIAGNOSIS — F17210 Nicotine dependence, cigarettes, uncomplicated: Secondary | ICD-10-CM

## 2022-02-05 DIAGNOSIS — K2100 Gastro-esophageal reflux disease with esophagitis, without bleeding: Secondary | ICD-10-CM | POA: Diagnosis not present

## 2022-02-05 DIAGNOSIS — R69 Illness, unspecified: Secondary | ICD-10-CM | POA: Diagnosis not present

## 2022-02-05 MED ORDER — OMEPRAZOLE 20 MG PO CPDR: 20.0000 mg | DELAYED_RELEASE_CAPSULE | Freq: Every day | ORAL | 1 refills | Status: DC

## 2022-02-05 NOTE — Assessment & Plan Note (Signed)
Small ventral hernia seen on previous imaging; pt complaint that abdominal distention has worsened and exacerbated GERD Denies changes in bowel/stool habits/patterns

## 2022-02-05 NOTE — Assessment & Plan Note (Signed)
Chronic, stable Remains pre-contemplative about quitting

## 2022-02-05 NOTE — Assessment & Plan Note (Signed)
Chronic, not treated Worse on R side at night Wakes with vomiting into mouth Has taken PPI, Prilosec, before however, never long term Reports that Prilosec can assist with post-prandial GI complaints and bloating; explained pharmacodynamics of medication to patient and recommend small, bland foods and frequent meals/snacks Trial of medication daily for 3 months Patient wishes to defer return to GI at this time

## 2022-02-05 NOTE — Assessment & Plan Note (Signed)
Chronic, acute on chronic exacerbations Does not eat during the day at work d/t GI complaints Eats 3 meals on the weekends Previously seen by GI, Vanga, and completed colonoscopy Previously have discussed other dietary treatments including use of FOD-MAP to assist

## 2022-02-05 NOTE — Assessment & Plan Note (Signed)
Complaints of epigastric pain and LUQ pain Epigastric pain has worsened and LUQ pain is new Declines referral to gastric at this time

## 2022-02-06 ENCOUNTER — Ambulatory Visit
Admission: RE | Admit: 2022-02-06 | Discharge: 2022-02-06 | Disposition: A | Discharge status: Home / Self Care | Payer: Medicare HMO | Source: Ambulatory Visit | Attending: Family Medicine | Admitting: Family Medicine

## 2022-02-06 DIAGNOSIS — R1084 Generalized abdominal pain: Secondary | ICD-10-CM | POA: Diagnosis not present

## 2022-02-06 DIAGNOSIS — R109 Unspecified abdominal pain: Secondary | ICD-10-CM | POA: Diagnosis not present

## 2022-02-06 NOTE — Progress Notes (Signed)
Large stool burden noted on Xray. Encourage increase in fiber to assist with complete BM.  Gwyneth Sprout, [address] [place], [place] [address] [phone] (phone) [phone] (fax) [place]

## 2022-02-07 DIAGNOSIS — L570 Actinic keratosis: Secondary | ICD-10-CM | POA: Diagnosis not present

## 2022-02-07 DIAGNOSIS — D2261 Melanocytic nevi of right upper limb, including shoulder: Secondary | ICD-10-CM | POA: Diagnosis not present

## 2022-02-07 DIAGNOSIS — D2262 Melanocytic nevi of left upper limb, including shoulder: Secondary | ICD-10-CM | POA: Diagnosis not present

## 2022-02-07 DIAGNOSIS — D2272 Melanocytic nevi of left lower limb, including hip: Secondary | ICD-10-CM | POA: Diagnosis not present

## 2022-02-07 DIAGNOSIS — X32XXXA Exposure to sunlight, initial encounter: Secondary | ICD-10-CM | POA: Diagnosis not present

## 2022-02-07 DIAGNOSIS — Z85828 Personal history of other malignant neoplasm of skin: Secondary | ICD-10-CM | POA: Diagnosis not present

## 2022-02-11 DIAGNOSIS — M5416 Radiculopathy, lumbar region: Secondary | ICD-10-CM | POA: Diagnosis not present

## 2022-02-22 DIAGNOSIS — M5416 Radiculopathy, lumbar region: Secondary | ICD-10-CM | POA: Diagnosis not present

## 2022-02-22 DIAGNOSIS — R69 Illness, unspecified: Secondary | ICD-10-CM | POA: Diagnosis not present

## 2022-02-22 DIAGNOSIS — M47816 Spondylosis without myelopathy or radiculopathy, lumbar region: Secondary | ICD-10-CM | POA: Diagnosis not present

## 2022-02-22 DIAGNOSIS — Z6826 Body mass index (BMI) 26.0-26.9, adult: Secondary | ICD-10-CM | POA: Diagnosis not present

## 2022-02-22 DIAGNOSIS — M461 Sacroiliitis, not elsewhere classified: Secondary | ICD-10-CM | POA: Diagnosis not present

## 2022-03-12 DIAGNOSIS — M5416 Radiculopathy, lumbar region: Secondary | ICD-10-CM | POA: Diagnosis not present

## 2022-04-11 DIAGNOSIS — Z6826 Body mass index (BMI) 26.0-26.9, adult: Secondary | ICD-10-CM | POA: Diagnosis not present

## 2022-04-11 DIAGNOSIS — M4310 Spondylolisthesis, site unspecified: Secondary | ICD-10-CM | POA: Diagnosis not present

## 2022-04-17 DIAGNOSIS — M2392 Unspecified internal derangement of left knee: Secondary | ICD-10-CM | POA: Diagnosis not present

## 2022-04-17 DIAGNOSIS — M25562 Pain in left knee: Secondary | ICD-10-CM | POA: Diagnosis not present

## 2022-04-23 DIAGNOSIS — M47816 Spondylosis without myelopathy or radiculopathy, lumbar region: Secondary | ICD-10-CM | POA: Diagnosis not present

## 2022-05-15 DIAGNOSIS — M47816 Spondylosis without myelopathy or radiculopathy, lumbar region: Secondary | ICD-10-CM | POA: Diagnosis not present

## 2022-05-27 DIAGNOSIS — M47816 Spondylosis without myelopathy or radiculopathy, lumbar region: Secondary | ICD-10-CM | POA: Diagnosis not present

## 2022-05-28 NOTE — Progress Notes (Unsigned)
I,[name],acting as a Education administrator for [employer], PA-C.,have documented all relevant documentation on the behalf of [name], PA-C,as directed by  [employer], PA-C while in the presence of [employer], PA-C.   Established patient visit   Patient: Katelyn Bartlett   DOB: September 03, 1955   66 y.o. Female  MRN: [id number] Visit Date: 05/31/2022  Today's healthcare provider: [name], PA-C   Chief Complaint  Patient presents with   Follow-up Chronic Disease   Anemia   Subjective    HPI  Patient here for 6 months follow-up. She also with concerns about a knot/lump on Left side at lower ribcage.  Also would like provider to take a look a spot on left side elbow, since a month ago that she notice it.. Reports that she was offered a appointment August 24, 2022 with Dr. Kellie Moor with [facility].  Influenza vaccine: Declined Pneumonia vaccine: Declined Shingles Vaccine Declined  Hypertension, follow-up  BP Readings from Last 3 Encounters:  05/31/22 117/77  02/05/22 134/64  12/31/21 137/75   Wt Readings from Last 3 Encounters:  05/31/22 165 lb (74.8 kg)  02/05/22 165 lb (74.8 kg)  11/30/21 163 lb (73.9 kg)     She was last seen for hypertension 6 months ago.  BP at that visit was 113/94. Management since that visit includes Metop 25 mg BID, Norvasc 5 mg .  She reports excellent compliance with treatment. She is not having side effects.   Outside blood pressures are 110/70's. Symptoms: No chest pain No chest pressure  No palpitations No syncope  No dyspnea No orthopnea  No paroxysmal nocturnal dyspnea Yes lower extremity edema-Left ankle   Pertinent labs Lab Results  Component Value Date   CHOL 150 11/30/2021   HDL 44 11/30/2021   LDLCALC 84 11/30/2021   TRIG 124 11/30/2021   CHOLHDL 3.4 11/30/2021   Lab Results  Component Value Date   NA 141 01/17/2022   K 4.2 01/17/2022   CREATININE 0.74 01/17/2022   EGFR 89 01/17/2022   GLUCOSE  103 (H) 01/17/2022   TSH 2.150 11/30/2021     The 10-year ASCVD risk score (Arnett DK, et al., 2019) is: 12.7%  ---------------------------------------------------------------------------------------------------  Lipid/Cholesterol, Follow-up  Last lipid panel Other pertinent labs  Lab Results  Component Value Date   CHOL 150 11/30/2021   HDL 44 11/30/2021   LDLCALC 84 11/30/2021   TRIG 124 11/30/2021   CHOLHDL 3.4 11/30/2021   Lab Results  Component Value Date   ALT 21 11/30/2021   AST 18 11/30/2021   PLT 286 01/24/2022   TSH 2.150 11/30/2021     She was last seen for this 6 months ago.  Management since that visit includes Atorvastatin 59m.  She reports excellent compliance with treatment. She is not having side effects.   Symptoms: No chest pain No chest pressure/discomfort  No dyspnea Yes lower extremity edema  No numbness or tingling of extremity No orthopnea  No palpitations No paroxysmal nocturnal dyspnea  No speech difficulty No syncope   Current diet: well balanced Current exercise: none  The 10-year ASCVD risk score (Arnett DK, et al., 2019) is: 12.7%  ---------------------------------------------------------------------------------------------------   Medications: Outpatient Medications Prior to Visit  Medication Sig   amLODipine (NORVASC) 5 MG tablet Take 1 tablet (5 mg total) by mouth daily.   metoprolol tartrate (LOPRESSOR) 25 MG tablet Take 1 tablet (25 mg total) by mouth 2 (two) times daily.   MULTIPLE VITAMIN PO  Take 1 tablet by mouth daily.   omeprazole (PRILOSEC) 20 MG capsule Take 1 capsule (20 mg total) by mouth daily.   [DISCONTINUED] atorvastatin (LIPITOR) 40 MG tablet Take 1 tablet (40 mg total) by mouth daily.   aspirin EC 81 MG tablet Take 81 mg by mouth daily.   gabapentin (NEURONTIN) 100 MG capsule Take 300 mg by mouth 3 (three) times daily.   No facility-administered medications prior to visit.    Review of Systems  All other  systems reviewed and are negative. Except see HPI  {Labs  Heme  Chem  Endocrine  Serology  Results Review (optional):23779}   Objective    BP 117/77 (BP Location: Right Arm, Patient Position: Sitting, Cuff Size: Normal)   Pulse 77   Resp 16   Ht _0  (1.676 m)   Wt 165 lb (74.8 kg)   BMI 26.63 kg/m  {Show previous vital signs (optional):23777}  Physical Exam Vitals reviewed.  Constitutional:      General: She is not in acute distress.    Appearance: Normal appearance. She is well-developed. She is not diaphoretic.  HENT:     Head: Normocephalic and atraumatic.  Eyes:     General: No scleral icterus.    Conjunctiva/sclera: Conjunctivae normal.  Neck:     Thyroid: No thyromegaly.  Cardiovascular:     Rate and Rhythm: Normal rate and regular rhythm.     Pulses: Normal pulses.     Heart sounds: Normal heart sounds. No murmur heard. Pulmonary:     Effort: Pulmonary effort is normal. No respiratory distress.     Breath sounds: Normal breath sounds. No wheezing, rhonchi or rales.  Musculoskeletal:     Cervical back: Neck supple.     Right lower leg: No edema.     Left lower leg: No edema.  Lymphadenopathy:     Cervical: No cervical adenopathy.  Skin:    General: Skin is warm and dry.     Findings: Lesion (60m in diameter skin-colored lesion/medial aspect of the left elbow) present. No rash.  Neurological:     Mental Status: She is alert and oriented to person, place, and time. Mental status is at baseline.  Psychiatric:        Mood and Affect: Mood normal.        Behavior: Behavior normal.      No results found for any visits on 05/31/22.  Assessment & Plan     1. Celiac disease *** - HgB A1c - Comprehensive metabolic panel  2. Gastroesophageal reflux disease with esophagitis without hemorrhage *** - Comprehensive metabolic panel  3. Cigarette smoker Pt has been smoking up to 1/2 pack a day, per chart review We will check chest XR We will discuss her  imaging results at her next appt  4. Generalized abdominal pain Most likely related to IBS-D Chronic and stable Continue her current diet, avoid trigger food Will check electrolytes as she has been having a few days of diarrhea recently. Will monitor  5. Ventral hernia without obstruction or gangrene Chronic and stable Asymptomatic at the moment Red flags were discussed with pt. Pt expressed her understanding and agreed to monitor. Will monitor  6. Essential hypertension Chronic and stable Continue to take metoprolol 25 mg BID and Norvasc 5 mg daily Continue low-salt diet and daily exercise - Comprehensive metabolic panel - Lipid panel Will FU in 6 mo  7. Raynaud's syndrome without gangrene Chronic and stable. Continue taking Norvasc 5 mg Will FU  8. Supraventricular tachycardia Chronic and stable. Her HR WNL Continue taking metoprolol 25 mg BID - Comprehensive metabolic panel Will FU   9. Chronic right-sided low back pain with right-sided sciatica Chronic and stable? Pt noticed that Excedrin OTC is helping. Gabapentin was not working for pain control. Continue Excedrin as needed Pt was encouraged to stay active as tolerated. Provided a sample of exercise for back and joints to try as tolerated. Pt has been scheduled with Neurology   10. Mixed hyperlipidemia Chronic and stable Her lipid from 11/30/21 were Wnl Ldl cholesterol was 84 - Comprehensive metabolic panel - Lipid panel Continue low-carb low -fat diet and daily exercise as tolerated/ has a chronic low back pain with sciatica  11. Prediabetes Chronic and stable Her last A1C was 5.8 Pt is worried that by gaining weight recently her status/prediabetic will change Continue to adhere to low-carb diet and daily exercise as tolerated - HgB A1c - Comprehensive metabolic panel Will FU in 6 mo  12. Weight gain Her current BMI 26.63 Pt believes that she gained a few pounds recently/her weight fluctuates - HgB  A1c - Comprehensive metabolic panel - Lipid panel Weight loss of 5% recommended via healthy low-carb low-cholesterol low-calorie diet and daily exercise as tolerated Will fu in 6 mo  13. Lump of rib Chronic problem. Painful lump on the left ribcage. Worried if it is a problem with a rib. - DG Chest 2 View; Future to rule out any lung abnormalities - DG Ribs Unilateral Left; Future to rule out any bone abnormalities Pt is a current day smoker. Will reassess after imaging results  FU in 6 mo for chronic conditions    The patient was advised to call back or seek an in-person evaluation if the symptoms worsen or if the condition fails to improve as anticipated.  I discussed the assessment and treatment plan with the patient. The patient was provided an opportunity to ask questions and all were answered. The patient agreed with the plan and demonstrated an understanding of the instructions.  The entirety of the information documented in the History of Present Illness, Review of Systems and Physical Exam were personally obtained by me. Portions of this information were initially documented by the CMA and reviewed by me for thoroughness and accuracy.    [name], [facility], [place] [phone] (phone) [phone] (fax)  [place]

## 2022-05-31 ENCOUNTER — Encounter: Payer: Self-pay | Admitting: Physician Assistant

## 2022-05-31 ENCOUNTER — Ambulatory Visit (INDEPENDENT_AMBULATORY_CARE_PROVIDER_SITE_OTHER): Payer: Medicare HMO | Admitting: Physician Assistant

## 2022-05-31 ENCOUNTER — Other Ambulatory Visit: Payer: Self-pay | Admitting: Family Medicine

## 2022-05-31 VITALS — BP 117/77 | HR 77 | Resp 16 | Ht 66.0 in | Wt 165.0 lb

## 2022-05-31 DIAGNOSIS — R1084 Generalized abdominal pain: Secondary | ICD-10-CM

## 2022-05-31 DIAGNOSIS — M899 Disorder of bone, unspecified: Secondary | ICD-10-CM | POA: Diagnosis not present

## 2022-05-31 DIAGNOSIS — E782 Mixed hyperlipidemia: Secondary | ICD-10-CM | POA: Diagnosis not present

## 2022-05-31 DIAGNOSIS — M5441 Lumbago with sciatica, right side: Secondary | ICD-10-CM | POA: Diagnosis not present

## 2022-05-31 DIAGNOSIS — K439 Ventral hernia without obstruction or gangrene: Secondary | ICD-10-CM

## 2022-05-31 DIAGNOSIS — I471 Supraventricular tachycardia, unspecified: Secondary | ICD-10-CM | POA: Diagnosis not present

## 2022-05-31 DIAGNOSIS — F17210 Nicotine dependence, cigarettes, uncomplicated: Secondary | ICD-10-CM | POA: Diagnosis not present

## 2022-05-31 DIAGNOSIS — G8929 Other chronic pain: Secondary | ICD-10-CM

## 2022-05-31 DIAGNOSIS — L989 Disorder of the skin and subcutaneous tissue, unspecified: Secondary | ICD-10-CM | POA: Diagnosis not present

## 2022-05-31 DIAGNOSIS — K2100 Gastro-esophageal reflux disease with esophagitis, without bleeding: Secondary | ICD-10-CM

## 2022-05-31 DIAGNOSIS — I10 Essential (primary) hypertension: Secondary | ICD-10-CM

## 2022-05-31 DIAGNOSIS — I7300 Raynaud's syndrome without gangrene: Secondary | ICD-10-CM

## 2022-05-31 DIAGNOSIS — R7303 Prediabetes: Secondary | ICD-10-CM | POA: Diagnosis not present

## 2022-05-31 DIAGNOSIS — R635 Abnormal weight gain: Secondary | ICD-10-CM | POA: Diagnosis not present

## 2022-05-31 DIAGNOSIS — R69 Illness, unspecified: Secondary | ICD-10-CM | POA: Diagnosis not present

## 2022-05-31 DIAGNOSIS — K900 Celiac disease: Secondary | ICD-10-CM

## 2022-05-31 NOTE — Telephone Encounter (Signed)
Requested Prescriptions  Pending Prescriptions Disp Refills   atorvastatin (LIPITOR) 40 MG tablet [Pharmacy Med Name: ATORVASTATIN 40 MG TABLET] 90 tablet 0    Sig: TAKE 1 TABLET BY MOUTH EVERY DAY     Cardiovascular:  Antilipid - Statins Failed - 05/31/2022  2:20 AM      Failed - Lipid Panel in normal range within the last 12 months    Cholesterol, Total  Date Value Ref Range Status  11/30/2021 150 100 - 199 mg/dL Final   LDL Chol Calc (NIH)  Date Value Ref Range Status  11/30/2021 84 0 - 99 mg/dL Final   HDL  Date Value Ref Range Status  11/30/2021 44 >39 mg/dL Final   Triglycerides  Date Value Ref Range Status  11/30/2021 124 0 - 149 mg/dL Final         Passed - Patient is not pregnant      Passed - Valid encounter within last 12 months    Recent Outpatient Visits           Today Celiac disease   [employer], [place], PA-C   3 months ago Generalized abdominal pain   [facility] Gwyneth Sprout, FNP   6 months ago Encounter for subsequent annual wellness visit (AWV) in Medicare patient   [facility] Tally Joe T, FNP   1 year ago Elevated glucose   [facility] Gwyneth Sprout, FNP   1 year ago Essential hypertension   [place], Vickki Muff, [place]

## 2022-06-01 ENCOUNTER — Other Ambulatory Visit: Payer: Self-pay | Admitting: Family Medicine

## 2022-06-01 DIAGNOSIS — I471 Supraventricular tachycardia, unspecified: Secondary | ICD-10-CM

## 2022-06-01 DIAGNOSIS — I10 Essential (primary) hypertension: Secondary | ICD-10-CM

## 2022-06-02 NOTE — Progress Notes (Incomplete)
I,Katelyn Bartlett,acting as a Education administrator for [employer], PA-C.,have documented all relevant documentation on the behalf of Katelyn Speak, PA-C,as directed by  [employer], PA-C while in the presence of [employer], PA-C.   Established patient visit   Patient: Katelyn Bartlett   DOB: 08-24-1955   66 y.o. Female  MRN: [id number] Visit Date: 05/31/2022  Today's healthcare provider: [name], PA-C   Chief Complaint  Patient presents with  . Follow-up Chronic Disease  . Anemia   Subjective    HPI  Patient here for 6 months follow-up. She also with concerns about a knot/lump on Left side at lower ribcage.  Also would like provider to take a look a spot on left side elbow, since a month ago that she notice it.. Reports that she was offered a appointment August 24, 2022 with Dr. Kellie Moor with [facility].  Influenza vaccine: Declined Pneumonia vaccine: Declined Shingles Vaccine Declined  Hypertension, follow-up  BP Readings from Last 3 Encounters:  05/31/22 117/77  02/05/22 134/64  12/31/21 137/75   Wt Readings from Last 3 Encounters:  05/31/22 165 lb (74.8 kg)  02/05/22 165 lb (74.8 kg)  11/30/21 163 lb (73.9 kg)     She was last seen for hypertension 6 months ago.  BP at that visit was 113/94. Management since that visit includes Metop 25 mg BID, Norvasc 5 mg .  She reports excellent compliance with treatment. She is not having side effects.   Outside blood pressures are 110/70's. Symptoms: No chest pain No chest pressure  No palpitations No syncope  No dyspnea No orthopnea  No paroxysmal nocturnal dyspnea Yes lower extremity edema-Left ankle   Pertinent labs Lab Results  Component Value Date   CHOL 150 11/30/2021   HDL 44 11/30/2021   LDLCALC 84 11/30/2021   TRIG 124 11/30/2021   CHOLHDL 3.4 11/30/2021   Lab Results  Component Value Date   NA 141 01/17/2022   K 4.2 01/17/2022   CREATININE 0.74 01/17/2022   EGFR 89 01/17/2022    GLUCOSE 103 (H) 01/17/2022   TSH 2.150 11/30/2021     The 10-year ASCVD risk score (Arnett DK, et al., 2019) is: 12.7%  ---------------------------------------------------------------------------------------------------  Lipid/Cholesterol, Follow-up  Last lipid panel Other pertinent labs  Lab Results  Component Value Date   CHOL 150 11/30/2021   HDL 44 11/30/2021   LDLCALC 84 11/30/2021   TRIG 124 11/30/2021   CHOLHDL 3.4 11/30/2021   Lab Results  Component Value Date   ALT 21 11/30/2021   AST 18 11/30/2021   PLT 286 01/24/2022   TSH 2.150 11/30/2021     She was last seen for this 6 months ago.  Management since that visit includes Atorvastatin 20m.  She reports excellent compliance with treatment. She is not having side effects.   Symptoms: No chest pain No chest pressure/discomfort  No dyspnea Yes lower extremity edema  No numbness or tingling of extremity No orthopnea  No palpitations No paroxysmal nocturnal dyspnea  No speech difficulty No syncope   Current diet: well balanced Current exercise: none  The 10-year ASCVD risk score (Arnett DK, et al., 2019) is: 12.7%  ---------------------------------------------------------------------------------------------------   Medications: Outpatient Medications Prior to Visit  Medication Sig  . amLODipine (NORVASC) 5 MG tablet Take 1 tablet (5 mg total) by mouth daily.  . metoprolol tartrate (LOPRESSOR) 25 MG tablet Take 1 tablet (25 mg total) by mouth 2 (two) times daily.  . MULTIPLE VITAMIN PO  Take 1 tablet by mouth daily.  Marland Kitchen omeprazole (PRILOSEC) 20 MG capsule Take 1 capsule (20 mg total) by mouth daily.  . [DISCONTINUED] atorvastatin (LIPITOR) 40 MG tablet Take 1 tablet (40 mg total) by mouth daily.  Marland Kitchen aspirin EC 81 MG tablet Take 81 mg by mouth daily.  Marland Kitchen gabapentin (NEURONTIN) 100 MG capsule Take 300 mg by mouth 3 (three) times daily.   No facility-administered medications prior to visit.    Review of Systems   All other systems reviewed and are negative. Except see HPI  {Labs  Heme  Chem  Endocrine  Serology  Results Review (optional):23779}   Objective    BP 117/77 (BP Location: Right Arm, Patient Position: Sitting, Cuff Size: Normal)   Pulse 77   Resp 16   Ht _0  (1.676 m)   Wt 165 lb (74.8 kg)   BMI 26.63 kg/m  {Show previous vital signs (optional):23777}  Physical Exam Vitals reviewed.  Constitutional:      General: She is not in acute distress.    Appearance: Normal appearance. She is well-developed. She is not diaphoretic.  HENT:     Head: Normocephalic and atraumatic.  Eyes:     General: No scleral icterus.    Conjunctiva/sclera: Conjunctivae normal.  Neck:     Thyroid: No thyromegaly.  Cardiovascular:     Rate and Rhythm: Normal rate and regular rhythm.     Pulses: Normal pulses.     Heart sounds: Normal heart sounds. No murmur heard. Pulmonary:     Effort: Pulmonary effort is normal. No respiratory distress.     Breath sounds: Normal breath sounds. No wheezing, rhonchi or rales.  Musculoskeletal:     Cervical back: Neck supple.     Right lower leg: No edema.     Left lower leg: No edema.  Lymphadenopathy:     Cervical: No cervical adenopathy.  Skin:    General: Skin is warm and dry.     Findings: Lesion (33m in diameter skin-colored lesion/medial aspect of the left elbow) present. No rash.  Neurological:     Mental Status: She is alert and oriented to person, place, and time. Mental status is at baseline.  Psychiatric:        Mood and Affect: Mood normal.        Behavior: Behavior normal.      No results found for any visits on 05/31/22.  Assessment & Plan     Cigarette smoker Pt has been smoking up to 1/2 pack a day, per chart review We will check chest XR We will discuss her imaging results at her next appt  Generalized abdominal pain Most likely related to IBS-D Chronic and stable Continue her current diet, avoid trigger food Will check  electrolytes as she has been having a few days of diarrhea recently. Will monitor In the past, she was diagnosed with celiac disease, tried a coupld of dietary treatment including use of FODMAP diet. Was seen by GI and completed colonoscopy. In the past, she was treated for GERD, has been on a trial for the past 3 mo. Has enough refills for 6 mo.   Ventral hernia without obstruction or gangrene Chronic and stable Asymptomatic at the moment Red flags were discussed with pt. Pt expressed her understanding and agreed to monitor. Will monitor  Essential hypertension Chronic and stable Continue to take metoprolol 25 mg BID and Norvasc 5 mg daily Continue low-salt diet and daily exercise - Comprehensive metabolic panel - Lipid  panel Will FU in 6 mo Raynaud's syndrome without gangrene Chronic and stable. Continue taking Norvasc 5 mg Will FU   Supraventricular tachycardia Chronic and stable. Her HR WNL Continue taking metoprolol 25 mg BID - Comprehensive metabolic panel Will FU   Chronic right-sided low back pain with right-sided sciatica Chronic and stable? Pt noticed that Excedrin OTC is helping. Gabapentin was not working for pain control. Continue Excedrin as needed Pt was encouraged to stay active as tolerated. Provided a sample of exercise for back and joints to try as tolerated. Pt has been scheduled with Neurology    Mixed hyperlipidemia Chronic and stable Her lipid from 11/30/21 were Wnl Ldl cholesterol was 84 - Comprehensive metabolic panel - Lipid panel Continue low-carb low -fat diet and daily exercise as tolerated/ has a chronic low back pain with sciatica  Prediabetes Chronic and stable Her last A1C was 5.8 Pt is worried that by gaining weight recently her status/prediabetic will change Continue to adhere to low-carb diet and daily exercise as tolerated - HgB A1c - Comprehensive metabolic panel Will FU in 6 mo  Weight gain Her current BMI 26.63 Pt  believes that she gained a few pounds recently/her weight fluctuates - HgB A1c - Comprehensive metabolic panel - Lipid panel Weight loss of 5% recommended via healthy low-carb low-cholesterol low-calorie diet and daily exercise as tolerated Will fu in 6 mo  Lump of rib Chronic problem. Painful lump on the left ribcage. Worried if it is a problem with a rib. - DG Chest 2 View; Future to rule out any lung abnormalities - DG Ribs Unilateral Left; Future to rule out any bone abnormalities Pt is a current day smoker. Will reassess after imaging results  FU in 6 mo for chronic conditions    The patient was advised to call back or seek an in-person evaluation if the symptoms worsen or if the condition fails to improve as anticipated.  I discussed the assessment and treatment plan with the patient. The patient was provided an opportunity to ask questions and all were answered. The patient agreed with the plan and demonstrated an understanding of the instructions.  The entirety of the information documented in the History of Present Illness, Review of Systems and Physical Exam were personally obtained by me. Portions of this information were initially documented by the CMA and reviewed by me for thoroughness and accuracy.    Katelyn Bartlett, [facility], [place] [phone] (phone) [phone] (fax)  [place]

## 2022-06-03 ENCOUNTER — Ambulatory Visit: Payer: Medicare HMO | Admitting: Family Medicine

## 2022-06-03 NOTE — Telephone Encounter (Signed)
Please, pt know that 2 XR were ordered : chest to rule out pulmonary abnormalities and rib to evaluate rib pain. If she will agree. If pt has any questions /concerns she might contact me via mychart or call to clinic.

## 2022-06-04 DIAGNOSIS — R7303 Prediabetes: Secondary | ICD-10-CM | POA: Diagnosis not present

## 2022-06-04 DIAGNOSIS — K900 Celiac disease: Secondary | ICD-10-CM | POA: Diagnosis not present

## 2022-06-04 DIAGNOSIS — E782 Mixed hyperlipidemia: Secondary | ICD-10-CM | POA: Diagnosis not present

## 2022-06-04 DIAGNOSIS — I10 Essential (primary) hypertension: Secondary | ICD-10-CM | POA: Diagnosis not present

## 2022-06-04 DIAGNOSIS — R635 Abnormal weight gain: Secondary | ICD-10-CM | POA: Diagnosis not present

## 2022-06-05 LAB — COMPREHENSIVE METABOLIC PANEL
ALT: 20 IU/L (ref 0–32)
AST: 17 IU/L (ref 0–40)
Albumin/Globulin Ratio: 2.2 (ref 1.2–2.2)
Albumin: 4.7 g/dL (ref 3.9–4.9)
Alkaline Phosphatase: 77 IU/L (ref 44–121)
BUN/Creatinine Ratio: 24 (ref 12–28)
BUN: 16 mg/dL (ref 8–27)
Bilirubin Total: 0.5 mg/dL (ref 0.0–1.2)
CO2: 25 mmol/L (ref 20–29)
Calcium: 9.6 mg/dL (ref 8.7–10.3)
Chloride: 102 mmol/L (ref 96–106)
Creatinine, Ser: 0.68 mg/dL (ref 0.57–1.00)
Globulin, Total: 2.1 g/dL (ref 1.5–4.5)
Glucose: 97 mg/dL (ref 70–99)
Potassium: 4.1 mmol/L (ref 3.5–5.2)
Sodium: 143 mmol/L (ref 134–144)
Total Protein: 6.8 g/dL (ref 6.0–8.5)
eGFR: 95 mL/min/{1.73_m2} (ref >59)

## 2022-06-05 LAB — LIPID PANEL
Chol/HDL Ratio: 3 ratio (ref 0.0–4.4)
Cholesterol, Total: 143 mg/dL (ref 100–199)
HDL: 47 mg/dL (ref >39)
LDL Chol Calc (NIH): 75 mg/dL (ref 0–99)
Triglycerides: 119 mg/dL (ref 0–149)
VLDL Cholesterol Cal: 21 mg/dL (ref 5–40)

## 2022-06-05 LAB — HEMOGLOBIN A1C
Est. average glucose Bld gHb Est-mCnc: 123 mg/dL
Hgb A1c MFr Bld: 5.9 % — ABNORMAL HIGH (ref 4.8–5.6)

## 2022-06-05 NOTE — Progress Notes (Signed)
Liver , electrolytes and kidney functions WNL, A1C is still in prediabetic range but appreciated a slight increase - 5.9 vs 5.8 6 mo ago. Life style modifications / low-carb diet and daily exercise as tolerated advised. If you have any concerns or questions, please, contact me via mychart or call.  [name], [facility], MMS [facility] [phone])

## 2022-06-06 ENCOUNTER — Ambulatory Visit
Admission: RE | Admit: 2022-06-06 | Discharge: 2022-06-06 | Disposition: A | Discharge status: Home / Self Care | Payer: Medicare HMO | Source: Ambulatory Visit | Attending: Physician Assistant | Admitting: Physician Assistant

## 2022-06-06 ENCOUNTER — Ambulatory Visit
Admission: RE | Admit: 2022-06-06 | Discharge: 2022-06-06 | Disposition: A | Discharge status: Home / Self Care | Payer: Medicare HMO | Attending: Physician Assistant | Admitting: Physician Assistant

## 2022-06-06 DIAGNOSIS — M47816 Spondylosis without myelopathy or radiculopathy, lumbar region: Secondary | ICD-10-CM | POA: Diagnosis not present

## 2022-06-06 DIAGNOSIS — F17210 Nicotine dependence, cigarettes, uncomplicated: Secondary | ICD-10-CM | POA: Insufficient documentation

## 2022-06-06 DIAGNOSIS — R222 Localized swelling, mass and lump, trunk: Secondary | ICD-10-CM | POA: Diagnosis not present

## 2022-06-06 DIAGNOSIS — J984 Other disorders of lung: Secondary | ICD-10-CM | POA: Diagnosis not present

## 2022-06-06 DIAGNOSIS — M899 Disorder of bone, unspecified: Secondary | ICD-10-CM | POA: Diagnosis not present

## 2022-06-06 DIAGNOSIS — I700 Atherosclerosis of aorta: Secondary | ICD-10-CM | POA: Diagnosis not present

## 2022-06-06 DIAGNOSIS — R0781 Pleurodynia: Secondary | ICD-10-CM | POA: Diagnosis not present

## 2022-06-06 DIAGNOSIS — R69 Illness, unspecified: Secondary | ICD-10-CM | POA: Diagnosis not present

## 2022-06-10 NOTE — Progress Notes (Signed)
Please, let pt know that XR showed  1. No evidence for acute abnormality. 2. Aortic atherosclerosis. 3. Degenerative changes in the lumbar spine. Minimal scarring or atelectasis noted in the LATERAL LEFT lung base. Could be due to recent viral infection? Costochondritis, pleurisy. Smoking could contribute as well. She might need to take NSAIDs for 3-5 days or prednisone for 3-5 days. Advised smoking cessation. If pt agrees, we could proceed with CT lung scan due to her smoking hx. Advised to take deep breathing/exercise.

## 2022-06-11 ENCOUNTER — Other Ambulatory Visit: Payer: Self-pay | Admitting: Physician Assistant

## 2022-06-11 DIAGNOSIS — F17210 Nicotine dependence, cigarettes, uncomplicated: Secondary | ICD-10-CM

## 2022-06-11 DIAGNOSIS — R0781 Pleurodynia: Secondary | ICD-10-CM

## 2022-06-11 DIAGNOSIS — M899 Disorder of bone, unspecified: Secondary | ICD-10-CM

## 2022-06-11 MED ORDER — PREDNISONE 20 MG PO TABS: 20.0000 mg | ORAL_TABLET | Freq: Two times a day (BID) | ORAL | 0 refills | Status: DC

## 2022-06-19 ENCOUNTER — Encounter: Payer: Self-pay | Admitting: Physician Assistant

## 2022-06-19 DIAGNOSIS — Z6826 Body mass index (BMI) 26.0-26.9, adult: Secondary | ICD-10-CM | POA: Diagnosis not present

## 2022-06-19 DIAGNOSIS — M4310 Spondylolisthesis, site unspecified: Secondary | ICD-10-CM | POA: Diagnosis not present

## 2022-07-30 DIAGNOSIS — M4310 Spondylolisthesis, site unspecified: Secondary | ICD-10-CM | POA: Diagnosis not present

## 2022-07-30 DIAGNOSIS — R69 Illness, unspecified: Secondary | ICD-10-CM | POA: Diagnosis not present

## 2022-07-30 DIAGNOSIS — M461 Sacroiliitis, not elsewhere classified: Secondary | ICD-10-CM | POA: Diagnosis not present

## 2022-07-30 DIAGNOSIS — Z6826 Body mass index (BMI) 26.0-26.9, adult: Secondary | ICD-10-CM | POA: Diagnosis not present

## 2022-08-15 ENCOUNTER — Other Ambulatory Visit: Payer: Self-pay | Admitting: Family Medicine

## 2022-08-15 DIAGNOSIS — K2100 Gastro-esophageal reflux disease with esophagitis, without bleeding: Secondary | ICD-10-CM

## 2022-08-26 DIAGNOSIS — R69 Illness, unspecified: Secondary | ICD-10-CM | POA: Diagnosis not present

## 2022-08-26 DIAGNOSIS — M5416 Radiculopathy, lumbar region: Secondary | ICD-10-CM | POA: Diagnosis not present

## 2022-08-26 DIAGNOSIS — M461 Sacroiliitis, not elsewhere classified: Secondary | ICD-10-CM | POA: Diagnosis not present

## 2022-08-26 DIAGNOSIS — F1120 Opioid dependence, uncomplicated: Secondary | ICD-10-CM | POA: Diagnosis not present

## 2022-08-26 DIAGNOSIS — M4310 Spondylolisthesis, site unspecified: Secondary | ICD-10-CM | POA: Diagnosis not present

## 2022-08-26 DIAGNOSIS — Z6827 Body mass index (BMI) 27.0-27.9, adult: Secondary | ICD-10-CM | POA: Diagnosis not present

## 2022-08-27 ENCOUNTER — Other Ambulatory Visit: Payer: Self-pay | Admitting: Family Medicine

## 2022-08-27 DIAGNOSIS — E782 Mixed hyperlipidemia: Secondary | ICD-10-CM

## 2022-09-04 ENCOUNTER — Telehealth: Payer: Self-pay

## 2022-09-04 NOTE — Telephone Encounter (Signed)
Spoke with patient regarding lung cancer screening and LDCT.  To enter Lung cancer screening, program requires a shared decision visit to understand elements of the program, share patient health/smoking history info and offer smoking cessation.  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  She states she would like a CT chest but is not ready to commit to annual LDCT.  She has declined the LCS program and would ask that her referring provider make further recommendations for her to have a CT Chest in order to follow up on the painful lump in her rib area.  Will close referral to LCS and copy PCP on this note.  Patient is requesting for PCP recommendations and follow up. Patient acknowledged understanding.

## 2022-09-12 NOTE — Telephone Encounter (Signed)
Call to patient- she is very upset that she never heard back from November request to have CT scan at another facility- other than  [facility]. See imaging note. Patient states she is note sure who Buckner,RN is with- but she called her at work and wanted her to listen to a video- which she could not do.Patient would follow up from provider to [place] results and recommended follow up since she is confused about what the scan showed and does not understand the explanations.

## 2022-09-19 NOTE — Telephone Encounter (Signed)
Patient called and advised per Letitia Libra, she says she will wait until May to discuss. She says if Letitia Libra thinks she should have an earlier scan prior to May based on the report of Minimal scarring in the LEFT mid lung zone and LEFT lung base on the chest xray done in November. She says this is what she's wanting and if the CT is done, she doesn't want it done at [facility].

## 2022-10-08 DIAGNOSIS — Z1231 Encounter for screening mammogram for malignant neoplasm of breast: Secondary | ICD-10-CM | POA: Diagnosis not present

## 2022-10-08 LAB — HM MAMMOGRAPHY

## 2022-10-24 DIAGNOSIS — M4310 Spondylolisthesis, site unspecified: Secondary | ICD-10-CM | POA: Diagnosis not present

## 2022-10-24 DIAGNOSIS — R69 Illness, unspecified: Secondary | ICD-10-CM | POA: Diagnosis not present

## 2022-10-24 DIAGNOSIS — M461 Sacroiliitis, not elsewhere classified: Secondary | ICD-10-CM | POA: Diagnosis not present

## 2022-11-22 ENCOUNTER — Other Ambulatory Visit: Payer: Self-pay | Admitting: Family Medicine

## 2022-11-22 DIAGNOSIS — E782 Mixed hyperlipidemia: Secondary | ICD-10-CM

## 2022-11-22 NOTE — Telephone Encounter (Signed)
Requested medication (s) are due for refill today: yes  Requested medication (s) are on the active medication list: yes  Last refill:  08/27/22 #90 0 refills  Future visit scheduled: yes in 1 week  Notes to clinic:  no refills remain. Do you want to refill Rx? Last labs 06/04/22.     Requested Prescriptions  Pending Prescriptions Disp Refills   atorvastatin (LIPITOR) 40 MG tablet [Pharmacy Med Name: ATORVASTATIN 40 MG TABLET] 90 tablet 0    Sig: TAKE 1 TABLET BY MOUTH EVERY DAY     Cardiovascular:  Antilipid - Statins Failed - 11/22/2022  2:15 AM      Failed - Lipid Panel in normal range within the last 12 months    Cholesterol, Total  Date Value Ref Range Status  06/04/2022 143 100 - 199 mg/dL Final   LDL Chol Calc (NIH)  Date Value Ref Range Status  06/04/2022 75 0 - 99 mg/dL Final   HDL  Date Value Ref Range Status  06/04/2022 47 >39 mg/dL Final   Triglycerides  Date Value Ref Range Status  06/04/2022 119 0 - 149 mg/dL Final         Passed - Patient is not pregnant      Passed - Valid encounter within last 12 months    Recent Outpatient Visits           5 months ago Essential hypertension   [facility] [place], Mc[place]ath, PA-C   9 months ago Generalized abdominal pain   [facility] Jacky Kindle, FNP   11 months ago Encounter for subsequent annual wellness visit (AWV) in Medicare patient   [facility] Merita Norton T, FNP   1 year ago Elevated glucose   [facility] Merita Norton T, FNP   2 years ago Essential hypertension   [facility], Jodell Cipro, PA-C       Future Appointments             In 1 week Debera Lat, PA-C [facility], [facility]

## 2022-11-29 ENCOUNTER — Ambulatory Visit (INDEPENDENT_AMBULATORY_CARE_PROVIDER_SITE_OTHER): Payer: Medicare HMO | Admitting: Physician Assistant

## 2022-11-29 VITALS — BP 124/61 | HR 68 | Temp 98.1°F | Wt 167.0 lb

## 2022-11-29 DIAGNOSIS — M5441 Lumbago with sciatica, right side: Secondary | ICD-10-CM

## 2022-11-29 DIAGNOSIS — F17210 Nicotine dependence, cigarettes, uncomplicated: Secondary | ICD-10-CM | POA: Diagnosis not present

## 2022-11-29 DIAGNOSIS — I7300 Raynaud's syndrome without gangrene: Secondary | ICD-10-CM

## 2022-11-29 DIAGNOSIS — K439 Ventral hernia without obstruction or gangrene: Secondary | ICD-10-CM | POA: Diagnosis not present

## 2022-11-29 DIAGNOSIS — I10 Essential (primary) hypertension: Secondary | ICD-10-CM

## 2022-11-29 DIAGNOSIS — G8929 Other chronic pain: Secondary | ICD-10-CM

## 2022-11-29 DIAGNOSIS — R7303 Prediabetes: Secondary | ICD-10-CM

## 2022-11-29 DIAGNOSIS — E782 Mixed hyperlipidemia: Secondary | ICD-10-CM

## 2022-11-29 DIAGNOSIS — I471 Supraventricular tachycardia, unspecified: Secondary | ICD-10-CM

## 2022-11-29 DIAGNOSIS — R1084 Generalized abdominal pain: Secondary | ICD-10-CM

## 2022-11-29 DIAGNOSIS — L989 Disorder of the skin and subcutaneous tissue, unspecified: Secondary | ICD-10-CM

## 2022-11-29 DIAGNOSIS — K900 Celiac disease: Secondary | ICD-10-CM

## 2022-11-29 NOTE — Progress Notes (Unsigned)
Established patient visit  Patient: Katelyn Bartlett   DOB: 11-04-1955   67 y.o. Female  MRN: [id number] Visit Date: 11/29/2022  Today's healthcare provider: Debera Lat, PA-C   No chief complaint on file.  Subjective    HPI   Patient is a 67 year old female that presents for 6 month follow up of chronic issues. Patient reports the following symptoms: Lack of energy, loss of interest in doing things: Doig anything wears her down, she stopped looking forward to Sunday, she feels achy, her appetite is off, she feels semi letargic. She is working without any problem but there are problems with "off work time."  Pt reports being cold all the time, "at 70 degree she cracking the thermostat up. All winter and early spring , she wears long sleeves to work . "  Pt endorses having worsening of her venous circulation/ varicose veins, she is wondering if she needs to go to vascular specialist, during her recent vacation her legs were swollen, from plane ride.  She has a hx of Raynaud's , she saw a rheumatology  and then, her pcp at that time  change her metoprolol to amlodipine. She stopped having any problem with Raynaud's since then.      11/29/2022    8:20 AM 05/31/2022    8:10 AM 02/05/2022    8:16 AM  Depression screen PHQ 2/9  Decreased Interest 1 0 0  Down, Depressed, Hopeless 0 0 0  PHQ - 2 Score 1 0 0  Altered sleeping 0 0 0  Tired, decreased energy 1 0 0  Change in appetite 1 0 0  Feeling bad or failure about yourself  0 0 0  Trouble concentrating 0 0 0  Moving slowly or fidgety/restless 0 0 0  Suicidal thoughts 0 0 0  PHQ-9 Score 3 0 0  Difficult doing work/chores Not difficult at all Not difficult at all Not difficult at all    Medications: Outpatient Medications Prior to Visit  Medication Sig   amLODipine (NORVASC) 5 MG tablet TAKE 1 TABLET (5 MG TOTAL) BY MOUTH DAILY.   atorvastatin (LIPITOR) 40 MG tablet TAKE 1 TABLET BY MOUTH EVERY DAY   metoprolol tartrate  (LOPRESSOR) 25 MG tablet TAKE 1 TABLET BY MOUTH TWICE A DAY   MULTIPLE VITAMIN PO Take 1 tablet by mouth daily.   omeprazole (PRILOSEC) 20 MG capsule TAKE 1 CAPSULE BY MOUTH EVERY DAY   predniSONE (DELTASONE) 20 MG tablet Take 1 tablet (20 mg total) by mouth 2 (two) times daily with a meal.   No facility-administered medications prior to visit.    Review of Systems  All other systems reviewed and are negative.  Except see HPI      Objective    BP 124/61 (BP Location: Left Arm, Patient Position: Sitting, Cuff Size: Normal)   Pulse 68   Temp 98.1 F (36.7 C) (Oral)   Wt 167 lb (75.8 kg)   SpO2 98%   BMI 26.95 kg/m    Physical Exam Vitals reviewed.  Constitutional:      General: She is not in acute distress.    Appearance: Normal appearance. She is well-developed. She is not diaphoretic.  HENT:     Head: Normocephalic and atraumatic.  Eyes:     General: No scleral icterus.    Conjunctiva/sclera: Conjunctivae normal.  Neck:     Thyroid: No thyromegaly.  Cardiovascular:     Rate and Rhythm: Normal rate and regular rhythm.  Pulses: Normal pulses.     Heart sounds: Normal heart sounds. No murmur heard. Pulmonary:     Effort: Pulmonary effort is normal. No respiratory distress.     Breath sounds: Normal breath sounds. No wheezing, rhonchi or rales.  Musculoskeletal:     Cervical back: Neck supple.     Right lower leg: No edema.     Left lower leg: No edema.  Lymphadenopathy:     Cervical: No cervical adenopathy.  Skin:    General: Skin is warm and dry.     Findings: No rash.  Neurological:     Mental Status: She is alert and oriented to person, place, and time. Mental status is at baseline.  Psychiatric:        Mood and Affect: Mood normal.        Behavior: Behavior normal.      No results found for any visits on 11/29/22.  Assessment & Plan    1. Cigarette smoker Chronic and advised smoking cessation Pt is reluctant Will revisit in her next 6 mo  FU Will check with our scheduler if a new order should be placed for CT scan/screening for lung cancer, she prefers to go to [facility].  2. Ventral hernia without obstruction or gangrene Chronic and stable.  Will monitor and reassess at her next FU or early if needed.  3. Essential hypertension Chronic and stable Her BP today WNL Continue amlodipine and metoprolol for supraventricular tachycardia Ordered : - CBC with Differential/Platelet - Hemoglobin A1c - Lipid Panel With LDL/HDL Ratio - Comprehensive metabolic panel - TSH Will FU  4. Raynaud's syndrome without gangrene Chronic and stable Continue taking amlodipine Advised smoking cessation, see above Will revisit at her next FU  5. Chronic right-sided low back pain with right-sided sciatica Chronic and stable Continue to use Excedrin but should be careful of bleeding potential of ASA Advised to continue staying active as tolerated. Will send to Neurology vs Orthopedics vs Pain clinic if pt agrees  6. Mixed hyperlipidemia Chronic and stable Continue atorvastatin 40mg  Ordered - CBC with Differential/Platelet - Hemoglobin A1c - Lipid Panel With LDL/HDL Ratio - Comprehensive metabolic panel - TSH Will reassess after  receiving lab results Will FU    7. Prediabetes Chronic and previously stable Continue lifestyle modifications - CBC with Differential/Platelet - Hemoglobin A1c - Lipid Panel With LDL/HDL Ratio - Comprehensive metabolic panel - TSH Will FU  No follow-ups on file.     The patient was advised to call back or seek an in-person evaluation if the symptoms worsen or if the condition fails to improve as anticipated.  I discussed the assessment and treatment plan with the patient. The patient was provided an opportunity to ask questions and all were answered. The patient agreed with the plan and demonstrated an understanding of the instructions.  I, Debera Lat, PA-C have reviewed all  documentation for this visit. The documentation on  11/29/22 for the exam, diagnosis, procedures, and orders are all accurate and complete.  Debera Lat, [facility], MMS [facility] [phone] (phone) [phone] (fax)  [facility]

## 2022-11-30 LAB — CBC WITH DIFFERENTIAL/PLATELET
Basophils Absolute: 0 10*3/uL (ref 0.0–0.2)
Basos: 1 %
EOS (ABSOLUTE): 0.1 10*3/uL (ref 0.0–0.4)
Eos: 2 %
Hematocrit: 47 % — ABNORMAL HIGH (ref 34.0–46.6)
Hemoglobin: 15.7 g/dL (ref 11.1–15.9)
Immature Grans (Abs): 0 10*3/uL (ref 0.0–0.1)
Immature Granulocytes: 0 %
Lymphocytes Absolute: 1.9 10*3/uL (ref 0.7–3.1)
Lymphs: 25 %
MCH: 30.9 pg (ref 26.6–33.0)
MCHC: 33.4 g/dL (ref 31.5–35.7)
MCV: 93 fL (ref 79–97)
Monocytes Absolute: 0.7 10*3/uL (ref 0.1–0.9)
Monocytes: 10 %
Neutrophils Absolute: 4.6 10*3/uL (ref 1.4–7.0)
Neutrophils: 62 %
Platelets: 263 10*3/uL (ref 150–450)
RBC: 5.08 x10E6/uL (ref 3.77–5.28)
RDW: 12.4 % (ref 11.7–15.4)
WBC: 7.4 10*3/uL (ref 3.4–10.8)

## 2022-11-30 LAB — COMPREHENSIVE METABOLIC PANEL
ALT: 20 IU/L (ref 0–32)
AST: 21 IU/L (ref 0–40)
Albumin/Globulin Ratio: 2 (ref 1.2–2.2)
Albumin: 4.5 g/dL (ref 3.9–4.9)
Alkaline Phosphatase: 89 IU/L (ref 44–121)
BUN/Creatinine Ratio: 20 (ref 12–28)
BUN: 12 mg/dL (ref 8–27)
Bilirubin Total: 0.4 mg/dL (ref 0.0–1.2)
CO2: 24 mmol/L (ref 20–29)
Calcium: 9.7 mg/dL (ref 8.7–10.3)
Chloride: 102 mmol/L (ref 96–106)
Creatinine, Ser: 0.59 mg/dL (ref 0.57–1.00)
Globulin, Total: 2.3 g/dL (ref 1.5–4.5)
Glucose: 91 mg/dL (ref 70–99)
Potassium: 4.4 mmol/L (ref 3.5–5.2)
Sodium: 142 mmol/L (ref 134–144)
Total Protein: 6.8 g/dL (ref 6.0–8.5)
eGFR: 99 mL/min/{1.73_m2} (ref >59)

## 2022-11-30 LAB — LIPID PANEL WITH LDL/HDL RATIO
Cholesterol, Total: 122 mg/dL (ref 100–199)
HDL: 35 mg/dL — ABNORMAL LOW (ref >39)
LDL Chol Calc (NIH): 60 mg/dL (ref 0–99)
LDL/HDL Ratio: 1.7 ratio (ref 0.0–3.2)
Triglycerides: 158 mg/dL — ABNORMAL HIGH (ref 0–149)
VLDL Cholesterol Cal: 27 mg/dL (ref 5–40)

## 2022-11-30 LAB — HEMOGLOBIN A1C
Est. average glucose Bld gHb Est-mCnc: 126 mg/dL
Hgb A1c MFr Bld: 6 % — ABNORMAL HIGH (ref 4.8–5.6)

## 2022-11-30 LAB — TSH: TSH: 4.02 u[IU]/mL (ref 0.450–4.500)

## 2022-12-01 ENCOUNTER — Encounter: Payer: Self-pay | Admitting: Physician Assistant

## 2022-12-04 ENCOUNTER — Telehealth: Payer: Self-pay | Admitting: Physician Assistant

## 2022-12-04 ENCOUNTER — Telehealth: Payer: Self-pay | Admitting: *Deleted

## 2022-12-04 NOTE — Telephone Encounter (Signed)
Pt called in and has questions about her A1C levels and what steps did her provider want her to take to control those levels. Please follow up with pt.

## 2022-12-04 NOTE — Telephone Encounter (Signed)
  Chief Complaint: Results Symptoms: NA Frequency: NA Pertinent Negatives: Patient denies NA Disposition: [] ED /[] Urgent Care (no appt availability in office) / [] Appointment(In office/virtual)/ []  [place] Virtual Care/ [] Home Care/ [] Refused Recommended Disposition /[] [place] Mobile Bus/ []  Follow-up with PCP Additional Notes: Pt calling for lab results. Did not see any message from PCP regarding results. Please advise

## 2022-12-10 ENCOUNTER — Encounter: Payer: Self-pay | Admitting: Physician Assistant

## 2022-12-12 ENCOUNTER — Ambulatory Visit (INDEPENDENT_AMBULATORY_CARE_PROVIDER_SITE_OTHER): Payer: Medicare HMO | Admitting: Physician Assistant

## 2022-12-12 ENCOUNTER — Encounter: Payer: Self-pay | Admitting: Physician Assistant

## 2022-12-12 VITALS — BP 111/71 | HR 72 | Temp 98.3°F | Ht 66.0 in | Wt 166.0 lb

## 2022-12-12 DIAGNOSIS — W57XXXA Bitten or stung by nonvenomous insect and other nonvenomous arthropods, initial encounter: Secondary | ICD-10-CM | POA: Diagnosis not present

## 2022-12-12 DIAGNOSIS — R21 Rash and other nonspecific skin eruption: Secondary | ICD-10-CM

## 2022-12-12 DIAGNOSIS — S80861A Insect bite (nonvenomous), right lower leg, initial encounter: Secondary | ICD-10-CM | POA: Diagnosis not present

## 2022-12-12 MED ORDER — AMOXICILLIN-POT CLAVULANATE 875-125 MG PO TABS: 1.0000 | ORAL_TABLET | Freq: Two times a day (BID) | ORAL | 0 refills | Status: DC

## 2022-12-12 NOTE — Progress Notes (Signed)
Established patient visit  Patient: Katelyn Bartlett   DOB: 1956-03-17   67 y.o. Female  MRN: [id number] Visit Date: 12/12/2022  Today's healthcare provider: Debera Lat, PA-C   Chief Complaint  Patient presents with   Rash    Tick was removed by pt on Sunday. Lt medial side back of the knee--redness, bruise, itching--5 days   Subjective    HPI  Pt states that she went to watch a movie to a theater on Sunday. She suspects that she was bitten by tick at that time. At home she removed a tick. The pic of the tick is in media file. Pt reports a rash developed around.     11/29/2022    8:35 AM 11/29/2022    8:20 AM 05/31/2022    8:10 AM  Depression screen PHQ 2/9  Decreased Interest 1 1 0  Down, Depressed, Hopeless 0 0 0  PHQ - 2 Score 1 1 0  Altered sleeping 0 0 0  Tired, decreased energy 1 1 0  Change in appetite 1 1 0  Feeling bad or failure about yourself  0 0 0  Trouble concentrating  0 0  Moving slowly or fidgety/restless 0 0 0  Suicidal thoughts 0 0 0  PHQ-9 Score 3 3 0  Difficult doing work/chores  Not difficult at all Not difficult at all       No data to display          Medications: Outpatient Medications Prior to Visit  Medication Sig   amLODipine (NORVASC) 5 MG tablet TAKE 1 TABLET (5 MG TOTAL) BY MOUTH DAILY.   atorvastatin (LIPITOR) 40 MG tablet TAKE 1 TABLET BY MOUTH EVERY DAY   metoprolol tartrate (LOPRESSOR) 25 MG tablet TAKE 1 TABLET BY MOUTH TWICE A DAY   MULTIPLE VITAMIN PO Take 1 tablet by mouth daily.   omeprazole (PRILOSEC) 20 MG capsule TAKE 1 CAPSULE BY MOUTH EVERY DAY   No facility-administered medications prior to visit.    Review of Systems  All other systems reviewed and are negative. Except see HPI      Objective    BP 111/71 (BP Location: Right Arm, Patient Position: Sitting, Cuff Size: Normal)   Pulse 72   Temp 98.3 F (36.8 C)   Ht 5\' 6"  (1.676 m)   Wt 166 lb (75.3 kg)   SpO2 98%   BMI 26.79 kg/m    Physical  Exam Vitals reviewed.  Constitutional:      General: She is not in acute distress.    Appearance: Normal appearance. She is well-developed. She is not diaphoretic.  HENT:     Head: Normocephalic and atraumatic.  Eyes:     General: No scleral icterus.    Conjunctiva/sclera: Conjunctivae normal.  Neck:     Thyroid: No thyromegaly.  Cardiovascular:     Rate and Rhythm: Normal rate and regular rhythm.     Pulses: Normal pulses.     Heart sounds: Normal heart sounds. No murmur heard. Pulmonary:     Effort: Pulmonary effort is normal. No respiratory distress.     Breath sounds: Normal breath sounds. No wheezing, rhonchi or rales.  Musculoskeletal:     Cervical back: Neck supple.     Right lower leg: No edema.     Left lower leg: No edema.  Lymphadenopathy:     Cervical: No cervical adenopathy.  Skin:    General: Skin is warm and dry.     Findings: Lesion and rash (see  pics in the media) present.  Neurological:     Mental Status: She is alert and oriented to person, place, and time. Mental status is at baseline.  Psychiatric:        Mood and Affect: Mood normal.        Behavior: Behavior normal.      No results found for any visits on 12/12/22.  Assessment & Plan    1. Rash 2. Tick bite of right lower leg, initial encounter Acute Could be due to Lyme disease with an early cutaneous manifestation/erythema multiforme? Advised: Ice, cool compresses. A paste of 3 tsp of baking soda and 1 tsp water may help salve bites. Routine wound care, wash with soap and water; antibiotics only if an infection Tetanus booster advised. Pt refused to be vaccinated , per chart review. Will advise her for a nurse visit or get it at local pharmacy Oral antihistamines / OTC benadryl may be helpful in control of itching/hives. Diphenhydramine adults: 25 to 50 mg PO every 4 to 6 hours. Daily maximum dose of 300 mg for adults Advised doxycycline but pt preferred to proceed with a different  antibiotic: - amoxicillin-clavulanate (AUGMENTIN) 875-125 MG tablet; Take 1 tablet by mouth 2 (two) times daily.  Dispense: 28 tablet; Refill: 0 The patient was advised to call back or seek an in-person evaluation if the symptoms worsen or if the condition fails to improve as anticipated.  No follow-ups on file.     I discussed the assessment and treatment plan with the patient. The patient was provided an opportunity to ask questions and all were answered. The patient agreed with the plan and demonstrated an understanding of the instructions.  I, Debera Lat, PA-C have reviewed all documentation for this visit. The documentation on  12/12/22 for the exam, diagnosis, procedures, and orders are all accurate and complete.  Debera Lat, [facility], MMS [facility] [phone] (phone) [phone] (fax)  [facility]

## 2022-12-19 ENCOUNTER — Encounter: Payer: Self-pay | Admitting: Physician Assistant

## 2022-12-19 ENCOUNTER — Ambulatory Visit: Payer: Self-pay | Admitting: *Deleted

## 2022-12-19 ENCOUNTER — Other Ambulatory Visit: Payer: Self-pay | Admitting: Physician Assistant

## 2022-12-19 DIAGNOSIS — B379 Candidiasis, unspecified: Secondary | ICD-10-CM

## 2022-12-19 MED ORDER — FLUCONAZOLE 150 MG PO TABS: 150.0000 mg | ORAL_TABLET | Freq: Once | ORAL | 1 refills | Status: AC

## 2022-12-19 NOTE — Telephone Encounter (Signed)
Message from Allen Kell sent at 12/19/2022 11:54 AM EDT  Summary: yeast infection?   Pt states she she was seen Thursday by her PCP for a tick bite and was prescribed and antibiotic. Pt states she is getting a yeast infection and is wanting to see if her PCP can call her in some medication for it. Please advise.  [id number] [place], [place] - [address] Phone: [phone] Fax: [phone]          Call History   Type Contact Phone/Fax User  12/19/2022 11:52 AM EDT Phone (Incoming) Zonia Kief, Stanton Kidney D "Debbie" (Self) [phone] (H) Allred, Jasmine Awe   Reason for Disposition  [1] Symptoms of a yeast infection (i.e., itchy, white discharge, not bad smelling) AND [2] not improved > 3 days following Care Advice    Taking Augmentin for a tick bite  Answer Assessment - Initial Assessment Questions 1. SYMPTOM: "What's the main symptom you're concerned about?" (e.g., pain, itching, dryness)     Itching and burning that started yesterday vaginally.    Taking Augmentin for a tick bite. 2. LOCATION: "Where is the  itching and burning located?" (e.g., inside/outside, left/right)     Vaginal  3. ONSET: "When did the  itching and burning  start?"     yesterday 4. PAIN: "Is there any pain?" If Yes, ask: "How bad is it?" (Scale: 1-10; mild, moderate, severe)   -  MILD (1-3): Doesn't interfere with normal activities.    -  MODERATE (4-7): Interferes with normal activities (e.g., work or school) or awakens from sleep.     -  SEVERE (8-10): Excruciating pain, unable to do any normal activities.     Mild burning at this point but it will only get worse without treatment. 5. ITCHING: "Is there any itching?" If Yes, ask: "How bad is it?" (Scale: 1-10; mild, moderate, severe)     Yes just starting to itch yesterday and a little worse today 6. CAUSE: "What do you think is causing the discharge?" "Have you had the same problem before? What happened then?"     The Augmentin she  is taking for the tick bite 7. OTHER SYMPTOMS: "Do you have any other symptoms?" (e.g., fever, itching, vaginal bleeding, pain with urination, injury to genital area, vaginal foreign body)     A little irritation in the vaginal area. 8. PREGNANCY: "Is there any chance you are pregnant?" "When was your last menstrual period?"     N/A due to age  Protocols used: Vaginal Symptoms-A-AH

## 2022-12-19 NOTE — Telephone Encounter (Signed)
  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 Symptoms: She is having mild itching and burning in her vaginal area and is requesting something for the vaginal yeast infection she tends to get when taking antibiotics.   Frequency: Started yesterday with mild itching and burning in vaginal area. Pertinent Negatives: Patient denies N/A Disposition: [] ED /[] Urgent Care (no appt availability in office) / [] Appointment(In office/virtual)/ []  [place] Virtual Care/ [] Home Care/ [] Refused Recommended Disposition /[] [place] Mobile Bus/ [x]  Follow-up with PCP Additional Notes: Message sent to Debera Lat, PA-C requesting medication for the vaginal infection.   [id number] [address]

## 2022-12-20 ENCOUNTER — Ambulatory Visit: Payer: Self-pay

## 2022-12-20 NOTE — Telephone Encounter (Signed)
  Pt called in wants to know if she should keep taking the antibiotic that gave her a yeast infection, while she is taking something for the yeast infection. She also states she has blue blobs on her bottom lip and not sure if its coming from the antibiotic. She send photos in my chart.    Chief Complaint: Asking if she should continue antibiotic with yeast infection, and also could antibiotic cause moth symptom. See picture in My Chart. States tick bite looks better. Symptoms: Above Frequency: Yesterday Pertinent Negatives: Patient denies  Disposition: [] ED /[] Urgent Care (no appt availability in office) / [] Appointment(In office/virtual)/ []  [place] Virtual Care/ [] Home Care/ [] Refused Recommended Disposition /[] [place] Mobile Bus/ [x]  Follow-up with PCP Additional Notes: Please  Answer Assessment - Initial Assessment Questions 1. NAME of MEDICINE: "What medicine(s) are you calling about?"     Augmentin 2. QUESTION: "What is your question?" (e.g., double dose of medicine, side effect)     Could antibiotic cause mouth "bubble"? Should she continue antibiotic with yeast infection? 3. PRESCRIBER: "Who prescribed the medicine?" Reason: if prescribed by specialist, call should be referred to that group.     [place] 4. SYMPTOMS: "Do you have any symptoms?" If Yes, ask: "What symptoms are you having?"  "How bad are the symptoms (e.g., mild, moderate, severe)     Bubble/blister to mouth 5. PREGNANCY:  "Is there any chance that you are pregnant?" "When was your last menstrual period?"     No  Protocols used: Medication Question Call-A-AH

## 2022-12-20 NOTE — Telephone Encounter (Signed)
Yes, she should finish all antibiotic. I don't think antibiotic has anything to do with blue lips.

## 2022-12-20 NOTE — Telephone Encounter (Signed)
Patient advised.

## 2023-01-15 ENCOUNTER — Telehealth: Payer: Self-pay | Admitting: Physician Assistant

## 2023-01-15 ENCOUNTER — Encounter: Payer: Self-pay | Admitting: Physician Assistant

## 2023-01-15 NOTE — Telephone Encounter (Signed)
Pt is calling in because she believes her medication atorvastatin (LIPITOR) 40 MG tablet [[id number]]  is causing her to have back pain. Pt wants to know if she could lower the dosage or if it's okay to stop it for a couple of weeks to see if her back gets better. Please advise.

## 2023-01-17 NOTE — Telephone Encounter (Signed)
Patient was advised she needed to make an appointment for evaluation. Appointment made for 01/22/23

## 2023-01-17 NOTE — Telephone Encounter (Signed)
Copied from CRM [id number]. Topic: General - Other >> Jan 17, 2023 11:40 AM Katelyn Bartlett wrote: Reason for CRM: Pt is calling back upset due to not getting Bartlett call back regarding her pt message that was sent and also Bartlett TE that was sent over on 01/15/23 regarding if pt need to stop taking medication or if she need to lower her dosage since it is causing back pain. Pt would like Bartlett call back today. Please advise. atorvastatin (LIPITOR) 40 MG tablet

## 2023-01-22 ENCOUNTER — Encounter: Payer: Self-pay | Admitting: Physician Assistant

## 2023-01-22 ENCOUNTER — Ambulatory Visit (INDEPENDENT_AMBULATORY_CARE_PROVIDER_SITE_OTHER): Payer: Medicare HMO | Admitting: Physician Assistant

## 2023-01-22 VITALS — BP 123/70 | HR 77 | Ht 66.0 in | Wt 166.4 lb

## 2023-01-22 DIAGNOSIS — F17210 Nicotine dependence, cigarettes, uncomplicated: Secondary | ICD-10-CM

## 2023-01-22 DIAGNOSIS — M5441 Lumbago with sciatica, right side: Secondary | ICD-10-CM

## 2023-01-22 DIAGNOSIS — E559 Vitamin D deficiency, unspecified: Secondary | ICD-10-CM | POA: Diagnosis not present

## 2023-01-22 DIAGNOSIS — G8929 Other chronic pain: Secondary | ICD-10-CM | POA: Diagnosis not present

## 2023-01-22 DIAGNOSIS — E782 Mixed hyperlipidemia: Secondary | ICD-10-CM

## 2023-01-22 DIAGNOSIS — M62838 Other muscle spasm: Secondary | ICD-10-CM | POA: Diagnosis not present

## 2023-01-22 NOTE — Progress Notes (Signed)
Established patient visit  Patient: Katelyn Bartlett   DOB: 05/13/1956   67 y.o. Female  MRN: [id number] Visit Date: 01/22/2023  Today's healthcare provider: Debera Lat, PA-C   Chief Complaint  Patient presents with   Medical Management of Chronic Issues    Patient is present to discuss further when statin medications changed due to wondering if that is causing back pain as that is something that was changed around the same time she started having back pain. Patient reports she would also like to discuss bump and bruise on her arm due to bumping door yesterday.   Subjective     Discussed the use of AI scribe software for clinical note transcription with the patient, who gave verbal consent to proceed.  History of Present Illness   The patient, with a history of high cholesterol and celiac disease, presents with concerns about the potential side effects of their statin medication, atorvastatin. She reports experiencing persistent back, hip, and leg pain, which she suspects might be related to the medication. The patient notes that her pain has gradually increased since she switched from pravastatin to atorvastatin due to the former's ineffectiveness in managing her cholesterol levels.  In addition to the musculoskeletal pain, the patient also reports having a lump on her arm following a minor injury. She expresses concerns about her skin's fragility and the rapid formation of the lump post-injury. The patient also mentions taking Excedrin for pain management and a multivitamin supplement daily. She expresses a desire to reduce her reliance on these medications.  The patient also discusses her bowel movements, which she describes as abnormal due to her celiac disease. She reports having multiple bowel movements in the morning, which she describes as a disruption to her workday.           01/22/2023    8:53 AM 11/29/2022    8:35 AM 11/29/2022    8:20 AM  Depression screen PHQ 2/9   Decreased Interest 0 1 1  Down, Depressed, Hopeless 0 0 0  PHQ - 2 Score 0 1 1  Altered sleeping  0 0  Tired, decreased energy  1 1  Change in appetite  1 1  Feeling bad or failure about yourself   0 0  Trouble concentrating   0  Moving slowly or fidgety/restless  0 0  Suicidal thoughts  0 0  PHQ-9 Score  3 3  Difficult doing work/chores   Not difficult at all    Medications: Outpatient Medications Prior to Visit  Medication Sig   amLODipine (NORVASC) 5 MG tablet TAKE 1 TABLET (5 MG TOTAL) BY MOUTH DAILY.   atorvastatin (LIPITOR) 40 MG tablet TAKE 1 TABLET BY MOUTH EVERY DAY   metoprolol tartrate (LOPRESSOR) 25 MG tablet TAKE 1 TABLET BY MOUTH TWICE A DAY   MULTIPLE VITAMIN PO Take 1 tablet by mouth daily.   omeprazole (PRILOSEC) 20 MG capsule TAKE 1 CAPSULE BY MOUTH EVERY DAY   amoxicillin-clavulanate (AUGMENTIN) 875-125 MG tablet Take 1 tablet by mouth 2 (two) times daily.   No facility-administered medications prior to visit.    Review of Systems  All other systems reviewed and are negative.  Except see HPI      Objective    BP 123/70 (BP Location: Right Arm, Patient Position: Sitting, Cuff Size: Normal)   Pulse 77   Ht 5\' 6"  (1.676 m)   Wt 166 lb 6.4 oz (75.5 kg)   SpO2 97%   BMI 26.86 kg/m  Physical Exam Vitals reviewed.  Constitutional:      General: She is not in acute distress.    Appearance: Normal appearance. She is well-developed. She is not diaphoretic.  HENT:     Head: Normocephalic and atraumatic.  Eyes:     General: No scleral icterus.    Conjunctiva/sclera: Conjunctivae normal.  Neck:     Thyroid: No thyromegaly.  Cardiovascular:     Rate and Rhythm: Normal rate and regular rhythm.     Pulses: Normal pulses.     Heart sounds: Normal heart sounds. No murmur heard. Pulmonary:     Effort: Pulmonary effort is normal. No respiratory distress.     Breath sounds: Normal breath sounds. No wheezing, rhonchi or rales.  Musculoskeletal:      Cervical back: Neck supple.     Right lower leg: No edema.     Left lower leg: No edema.  Lymphadenopathy:     Cervical: No cervical adenopathy.  Skin:    General: Skin is warm and dry.     Findings: No rash.  Neurological:     Mental Status: She is alert and oriented to person, place, and time. Mental status is at baseline.  Psychiatric:        Mood and Affect: Mood normal.        Behavior: Behavior normal.      No results found for any visits on 01/22/23.  Assessment & Plan     Mixed hyperlipidemia Chronic lower back pain Muscle spasms/New problem Patient switched from pravastatin to atorvastatin due to inadequate control of cholesterol levels. Patient reports potential side effects of muscle pain and cramps since the switch. Discussed the possibility of statin-induced myalgia/back pain.  -Order lipid panel to assess current cholesterol levels. -Order special test to assess if muscle pain is related to statin use. -Consider reducing atorvastatin dose or switching to another statin if muscle pain is confirmed to be related to current medication. - CK (Creatine Kinase) - Lipid panel - Magnesium Muscle Spasms: Patient reports history of leg cramps, which have improved but still occur occasionally. Discussed the potential role of magnesium in managing muscle spasms. Per lab review, K was WNL -Consider checking magnesium levels if spasms persist.  Vitamin D Deficiency: Patient has a history of low vitamin D levels. Currently taking multivitamin which includes vitamin D, but levels remain on the lower end of normal. -Increase vitamin D supplementation by taking an additional vitamin D pill daily.  Cigarette smoker Smoking cessation advised Pt is reluctant Will revisit at her next appt. Bruising: Patient presents with a large bruise on the arm, likely due to thin skin and fragility. Patient is currently managing with ice and elevation. -Continue current management with ice and  elevation. -Observe for changes in color and size, indicating resolution.  General Health Maintenance: -Continue taking omega 3 fish oil supplement for overall cardiovascular health. -Consider using a topical magnesium product for muscle spasms.      No follow-ups on file.     The patient was advised to call back or seek an in-person evaluation if the symptoms worsen or if the condition fails to improve as anticipated.  I discussed the assessment and treatment plan with the patient. The patient was provided an opportunity to ask questions and all were answered. The patient agreed with the plan and demonstrated an understanding of the instructions.  I, Debera Lat, PA-C have reviewed all documentation for this visit. The documentation on  01/22/23 for the exam, diagnosis,  procedures, and orders are all accurate and complete.  Debera Lat, [facility], MMS [facility] [phone] (phone) [phone] (fax)  [facility]

## 2023-01-23 LAB — LIPID PANEL
Chol/HDL Ratio: 3.4 ratio (ref 0.0–4.4)
Cholesterol, Total: 127 mg/dL (ref 100–199)
HDL: 37 mg/dL — ABNORMAL LOW (ref >39)
LDL Chol Calc (NIH): 62 mg/dL (ref 0–99)
Triglycerides: 165 mg/dL — ABNORMAL HIGH (ref 0–149)
VLDL Cholesterol Cal: 28 mg/dL (ref 5–40)

## 2023-01-23 LAB — CK: Total CK: 43 U/L (ref 32–182)

## 2023-01-23 LAB — MAGNESIUM: Magnesium: 2 mg/dL (ref 1.6–2.3)

## 2023-01-24 ENCOUNTER — Encounter: Payer: Self-pay | Admitting: Physician Assistant

## 2023-01-31 ENCOUNTER — Other Ambulatory Visit: Payer: Self-pay | Admitting: Physician Assistant

## 2023-01-31 DIAGNOSIS — K2100 Gastro-esophageal reflux disease with esophagitis, without bleeding: Secondary | ICD-10-CM

## 2023-01-31 NOTE — Telephone Encounter (Signed)
Requested Prescriptions  Pending Prescriptions Disp Refills   omeprazole (PRILOSEC) 20 MG capsule [Pharmacy Med Name: OMEPRAZOLE DR 20 MG CAPSULE] 90 capsule 3    Sig: TAKE 1 CAPSULE BY MOUTH EVERY DAY     Gastroenterology: Proton Pump Inhibitors Passed - 01/31/2023 11:57 AM      Passed - Valid encounter within last 12 months    Recent Outpatient Visits           1 week ago Cigarette smoker   [place] [facility] [place], [place], PA-C   1 month ago Rash   [place] [facility] [place], [place], PA-C   2 months ago Cigarette smoker   [place] [facility] [place], [place], PA-C   8 months ago Essential hypertension   [facility] [place], [place], PA-C   12 months ago Generalized abdominal pain   [facility] Jacky Kindle, [place]       Future Appointments             In 4 months Ostwalt, Edmon Crape, PA-C [facility], [facility]

## 2023-02-13 DIAGNOSIS — D2272 Melanocytic nevi of left lower limb, including hip: Secondary | ICD-10-CM | POA: Diagnosis not present

## 2023-02-13 DIAGNOSIS — S20461A Insect bite (nonvenomous) of right back wall of thorax, initial encounter: Secondary | ICD-10-CM | POA: Diagnosis not present

## 2023-02-13 DIAGNOSIS — D2261 Melanocytic nevi of right upper limb, including shoulder: Secondary | ICD-10-CM | POA: Diagnosis not present

## 2023-02-13 DIAGNOSIS — S20469A Insect bite (nonvenomous) of unspecified back wall of thorax, initial encounter: Secondary | ICD-10-CM | POA: Diagnosis not present

## 2023-02-13 DIAGNOSIS — Z85828 Personal history of other malignant neoplasm of skin: Secondary | ICD-10-CM | POA: Diagnosis not present

## 2023-02-13 DIAGNOSIS — Z08 Encounter for follow-up examination after completed treatment for malignant neoplasm: Secondary | ICD-10-CM | POA: Diagnosis not present

## 2023-02-13 DIAGNOSIS — D2262 Melanocytic nevi of left upper limb, including shoulder: Secondary | ICD-10-CM | POA: Diagnosis not present

## 2023-02-13 DIAGNOSIS — D225 Melanocytic nevi of trunk: Secondary | ICD-10-CM | POA: Diagnosis not present

## 2023-02-15 ENCOUNTER — Other Ambulatory Visit: Payer: Self-pay | Admitting: Physician Assistant

## 2023-02-15 DIAGNOSIS — E782 Mixed hyperlipidemia: Secondary | ICD-10-CM

## 2023-02-17 NOTE — Telephone Encounter (Signed)
Requested Prescriptions  Pending Prescriptions Disp Refills   atorvastatin (LIPITOR) 40 MG tablet [Pharmacy Med Name: ATORVASTATIN 40 MG TABLET] 90 tablet 0    Sig: TAKE 1 TABLET BY MOUTH EVERY DAY     Cardiovascular:  Antilipid - Statins Failed - 02/15/2023  9:27 AM      Failed - Lipid Panel in normal range within the last 12 months    Cholesterol, Total  Date Value Ref Range Status  01/22/2023 127 100 - 199 mg/dL Final   LDL Chol Calc (NIH)  Date Value Ref Range Status  01/22/2023 62 0 - 99 mg/dL Final   HDL  Date Value Ref Range Status  01/22/2023 37 (L) >39 mg/dL Final   Triglycerides  Date Value Ref Range Status  01/22/2023 165 (H) 0 - 149 mg/dL Final         Passed - Patient is not pregnant      Passed - Valid encounter within last 12 months    Recent Outpatient Visits           3 weeks ago Cigarette smoker   [place] [facility] [place], [place], PA-C   2 months ago Rash   [facility] [place], [place], PA-C   2 months ago Cigarette smoker   [place] [facility] [place], [place], PA-C   8 months ago Essential hypertension   [place] [facility] [place], [place], PA-C   1 year ago Generalized abdominal pain   [place] [facility] [place] Merita Norton T, FNP       Future Appointments             In 3 months Ostwalt, Edmon Crape, PA-C [facility], [facility]

## 2023-02-25 ENCOUNTER — Other Ambulatory Visit: Payer: Self-pay | Admitting: Physician Assistant

## 2023-02-25 DIAGNOSIS — I10 Essential (primary) hypertension: Secondary | ICD-10-CM

## 2023-02-25 DIAGNOSIS — I471 Supraventricular tachycardia, unspecified: Secondary | ICD-10-CM

## 2023-02-26 NOTE — Telephone Encounter (Signed)
Requested Prescriptions  Pending Prescriptions Disp Refills   metoprolol tartrate (LOPRESSOR) 25 MG tablet [Pharmacy Med Name: METOPROLOL TARTRATE 25 MG TAB] 180 tablet 1    Sig: TAKE 1 TABLET BY MOUTH TWICE A DAY     Cardiovascular:  Beta Blockers Passed - 02/25/2023  9:29 AM      Passed - Last BP in normal range    BP Readings from Last 1 Encounters:  01/22/23 123/70         Passed - Last Heart Rate in normal range    Pulse Readings from Last 1 Encounters:  01/22/23 77         Passed - Valid encounter within last 6 months    Recent Outpatient Visits           1 month ago Cigarette smoker   [place] [facility] [place], [place], PA-C   2 months ago Rash   [place] [facility] [place], [place], PA-C   2 months ago Cigarette smoker   [place] [place], [place], PA-C   9 months ago Essential hypertension   [place] [facility] [place], [place], PA-C   1 year ago Generalized abdominal pain   [place] [facility] Jacky Kindle, FNP       Future Appointments             In 3 months Ostwalt, [place], PA-C [facility], [facility]             amLODipine (NORVASC) 5 MG tablet [Pharmacy Med Name: AMLODIPINE BESYLATE 5 MG TAB] 90 tablet 1    Sig: TAKE 1 TABLET (5 MG TOTAL) BY MOUTH DAILY.     Cardiovascular: Calcium Channel Blockers 2 Passed - 02/25/2023  9:29 AM      Passed - Last BP in normal range    BP Readings from Last 1 Encounters:  01/22/23 123/70         Passed - Last Heart Rate in normal range    Pulse Readings from Last 1 Encounters:  01/22/23 77         Passed - Valid encounter within last 6 months    Recent Outpatient Visits           1 month ago Cigarette smoker   [place] [facility] [place], [place], PA-C   2 months ago Rash   [place] [facility] [place], [place], PA-C   2 months ago Cigarette smoker    [place] [facility] [facility] [place], [place], PA-C   9 months ago Essential hypertension   [place] [facility] [place], [place], PA-C   1 year ago Generalized abdominal pain   [place] [facility] Jacky Kindle, FNP       Future Appointments             In 3 months Ostwalt, Edmon Crape, PA-C [facility], [facility]

## 2023-03-19 DIAGNOSIS — M461 Sacroiliitis, not elsewhere classified: Secondary | ICD-10-CM | POA: Diagnosis not present

## 2023-03-19 DIAGNOSIS — F1120 Opioid dependence, uncomplicated: Secondary | ICD-10-CM | POA: Diagnosis not present

## 2023-03-19 DIAGNOSIS — M4310 Spondylolisthesis, site unspecified: Secondary | ICD-10-CM | POA: Diagnosis not present

## 2023-03-19 DIAGNOSIS — Z6826 Body mass index (BMI) 26.0-26.9, adult: Secondary | ICD-10-CM | POA: Diagnosis not present

## 2023-05-15 ENCOUNTER — Other Ambulatory Visit: Payer: Self-pay | Admitting: Physician Assistant

## 2023-05-15 DIAGNOSIS — E782 Mixed hyperlipidemia: Secondary | ICD-10-CM

## 2023-06-01 NOTE — Progress Notes (Unsigned)
Established patient visit  Patient: Katelyn Bartlett   DOB: January 18, 1956   67 y.o. Female  MRN: [id number] Visit Date: 06/03/2023  Today's healthcare provider: Debera Lat, PA-C   No chief complaint on file.  Subjective    HPI  *** Discussed the use of AI scribe software for clinical note transcription with the patient, who gave verbal consent to proceed.  History of Present Illness               01/22/2023    8:53 AM 11/29/2022    8:35 AM 11/29/2022    8:20 AM  Depression screen PHQ 2/9  Decreased Interest 0 1 1  Down, Depressed, Hopeless 0 0 0  PHQ - 2 Score 0 1 1  Altered sleeping  0 0  Tired, decreased energy  1 1  Change in appetite  1 1  Feeling bad or failure about yourself   0 0  Trouble concentrating   0  Moving slowly or fidgety/restless  0 0  Suicidal thoughts  0 0  PHQ-9 Score  3 3  Difficult doing work/chores   Not difficult at all       No data to display          Medications: Outpatient Medications Prior to Visit  Medication Sig   amLODipine (NORVASC) 5 MG tablet TAKE 1 TABLET (5 MG TOTAL) BY MOUTH DAILY.   atorvastatin (LIPITOR) 40 MG tablet TAKE 1 TABLET BY MOUTH EVERY DAY   metoprolol tartrate (LOPRESSOR) 25 MG tablet TAKE 1 TABLET BY MOUTH TWICE A DAY   MULTIPLE VITAMIN PO Take 1 tablet by mouth daily.   omeprazole (PRILOSEC) 20 MG capsule TAKE 1 CAPSULE BY MOUTH EVERY DAY   No facility-administered medications prior to visit.    Review of Systems  All other systems reviewed and are negative.  Except see HPI   {Insert previous labs (optional):23779} {See past labs  Heme  Chem  Endocrine  Serology  Results Review (optional):1}   Objective    There were no vitals taken for this visit. {Insert last BP/Wt (optional):23777}{See vitals history (optional):1}   Physical Exam Vitals reviewed.  Constitutional:      General: She is not in acute distress.    Appearance: Normal appearance. She is well-developed. She is not  diaphoretic.  HENT:     Head: Normocephalic and atraumatic.  Eyes:     General: No scleral icterus.    Conjunctiva/sclera: Conjunctivae normal.  Neck:     Thyroid: No thyromegaly.  Cardiovascular:     Rate and Rhythm: Normal rate and regular rhythm.     Pulses: Normal pulses.     Heart sounds: Normal heart sounds. No murmur heard. Pulmonary:     Effort: Pulmonary effort is normal. No respiratory distress.     Breath sounds: Normal breath sounds. No wheezing, rhonchi or rales.  Musculoskeletal:     Cervical back: Neck supple.     Right lower leg: No edema.     Left lower leg: No edema.  Lymphadenopathy:     Cervical: No cervical adenopathy.  Skin:    General: Skin is warm and dry.     Findings: No rash.  Neurological:     Mental Status: She is alert and oriented to person, place, and time. Mental status is at baseline.  Psychiatric:        Mood and Affect: Mood normal.        Behavior: Behavior normal.      No  results found for any visits on 06/03/23.  Assessment & Plan      No follow-ups on file.     The patient was advised to call back or seek an in-person evaluation if the symptoms worsen or if the condition fails to improve as anticipated.  I discussed the assessment and treatment plan with the patient. The patient was provided an opportunity to ask questions and all were answered. The patient agreed with the plan and demonstrated an understanding of the instructions.  I, Debera Lat, PA-C have reviewed all documentation for this visit. The documentation on  06/02/23  for the exam, diagnosis, procedures, and orders are all accurate and complete.  Debera Lat, [facility], MMS [facility] [phone] (phone) [phone] (fax)  [facility]

## 2023-06-03 ENCOUNTER — Encounter: Payer: Self-pay | Admitting: Physician Assistant

## 2023-06-03 ENCOUNTER — Ambulatory Visit (INDEPENDENT_AMBULATORY_CARE_PROVIDER_SITE_OTHER): Payer: Medicare HMO | Admitting: Physician Assistant

## 2023-06-03 VITALS — BP 121/71 | HR 75 | Resp 16 | Ht 66.0 in | Wt 163.0 lb

## 2023-06-03 DIAGNOSIS — G8929 Other chronic pain: Secondary | ICD-10-CM | POA: Diagnosis not present

## 2023-06-03 DIAGNOSIS — K2100 Gastro-esophageal reflux disease with esophagitis, without bleeding: Secondary | ICD-10-CM

## 2023-06-03 DIAGNOSIS — E559 Vitamin D deficiency, unspecified: Secondary | ICD-10-CM

## 2023-06-03 DIAGNOSIS — I10 Essential (primary) hypertension: Secondary | ICD-10-CM

## 2023-06-03 DIAGNOSIS — M62838 Other muscle spasm: Secondary | ICD-10-CM

## 2023-06-03 DIAGNOSIS — R7303 Prediabetes: Secondary | ICD-10-CM

## 2023-06-03 DIAGNOSIS — H93293 Other abnormal auditory perceptions, bilateral: Secondary | ICD-10-CM

## 2023-06-03 DIAGNOSIS — F17210 Nicotine dependence, cigarettes, uncomplicated: Secondary | ICD-10-CM | POA: Diagnosis not present

## 2023-06-03 DIAGNOSIS — R5383 Other fatigue: Secondary | ICD-10-CM | POA: Diagnosis not present

## 2023-06-03 DIAGNOSIS — E7849 Other hyperlipidemia: Secondary | ICD-10-CM | POA: Diagnosis not present

## 2023-06-04 DIAGNOSIS — R5383 Other fatigue: Secondary | ICD-10-CM | POA: Insufficient documentation

## 2023-06-04 DIAGNOSIS — E7849 Other hyperlipidemia: Secondary | ICD-10-CM | POA: Insufficient documentation

## 2023-06-04 LAB — CBC WITH DIFFERENTIAL/PLATELET
Basophils Absolute: 0.1 10*3/uL (ref 0.0–0.2)
Basos: 1 %
EOS (ABSOLUTE): 0.1 10*3/uL (ref 0.0–0.4)
Eos: 2 %
Hematocrit: 47.2 % — ABNORMAL HIGH (ref 34.0–46.6)
Hemoglobin: 16.1 g/dL — ABNORMAL HIGH (ref 11.1–15.9)
Immature Grans (Abs): 0 10*3/uL (ref 0.0–0.1)
Immature Granulocytes: 0 %
Lymphocytes Absolute: 2 10*3/uL (ref 0.7–3.1)
Lymphs: 25 %
MCH: 31.8 pg (ref 26.6–33.0)
MCHC: 34.1 g/dL (ref 31.5–35.7)
MCV: 93 fL (ref 79–97)
Monocytes Absolute: 0.8 10*3/uL (ref 0.1–0.9)
Monocytes: 10 %
Neutrophils Absolute: 5.2 10*3/uL (ref 1.4–7.0)
Neutrophils: 62 %
Platelets: 280 10*3/uL (ref 150–450)
RBC: 5.06 x10E6/uL (ref 3.77–5.28)
RDW: 12.9 % (ref 11.7–15.4)
WBC: 8.2 10*3/uL (ref 3.4–10.8)

## 2023-06-04 LAB — LIPID PANEL
Chol/HDL Ratio: 3.4 ratio (ref 0.0–4.4)
Cholesterol, Total: 124 mg/dL (ref 100–199)
HDL: 37 mg/dL — ABNORMAL LOW (ref >39)
LDL Chol Calc (NIH): 57 mg/dL (ref 0–99)
Triglycerides: 178 mg/dL — ABNORMAL HIGH (ref 0–149)
VLDL Cholesterol Cal: 30 mg/dL (ref 5–40)

## 2023-06-04 LAB — HEMOGLOBIN A1C
Est. average glucose Bld gHb Est-mCnc: 126 mg/dL
Hgb A1c MFr Bld: 6 % — ABNORMAL HIGH (ref 4.8–5.6)

## 2023-06-05 DIAGNOSIS — F1120 Opioid dependence, uncomplicated: Secondary | ICD-10-CM | POA: Diagnosis not present

## 2023-06-05 DIAGNOSIS — M461 Sacroiliitis, not elsewhere classified: Secondary | ICD-10-CM | POA: Diagnosis not present

## 2023-06-05 DIAGNOSIS — M4310 Spondylolisthesis, site unspecified: Secondary | ICD-10-CM | POA: Diagnosis not present

## 2023-06-11 DIAGNOSIS — H900 Conductive hearing loss, bilateral: Secondary | ICD-10-CM | POA: Diagnosis not present

## 2023-06-11 DIAGNOSIS — J018 Other acute sinusitis: Secondary | ICD-10-CM | POA: Diagnosis not present

## 2023-06-11 DIAGNOSIS — H66003 Acute suppurative otitis media without spontaneous rupture of ear drum, bilateral: Secondary | ICD-10-CM | POA: Diagnosis not present

## 2023-07-09 DIAGNOSIS — J3489 Other specified disorders of nose and nasal sinuses: Secondary | ICD-10-CM | POA: Diagnosis not present

## 2023-07-09 DIAGNOSIS — J018 Other acute sinusitis: Secondary | ICD-10-CM | POA: Diagnosis not present

## 2023-08-10 ENCOUNTER — Other Ambulatory Visit: Payer: Self-pay | Admitting: Physician Assistant

## 2023-08-10 DIAGNOSIS — E782 Mixed hyperlipidemia: Secondary | ICD-10-CM

## 2023-09-02 DIAGNOSIS — M4310 Spondylolisthesis, site unspecified: Secondary | ICD-10-CM | POA: Diagnosis not present

## 2023-09-02 DIAGNOSIS — F1120 Opioid dependence, uncomplicated: Secondary | ICD-10-CM | POA: Diagnosis not present

## 2023-10-14 DIAGNOSIS — Z1231 Encounter for screening mammogram for malignant neoplasm of breast: Secondary | ICD-10-CM | POA: Diagnosis not present

## 2023-10-14 LAB — HM MAMMOGRAPHY

## 2023-10-16 ENCOUNTER — Encounter: Payer: Self-pay | Admitting: Physician Assistant

## 2023-10-22 ENCOUNTER — Other Ambulatory Visit: Payer: Self-pay | Admitting: Physician Assistant

## 2023-10-22 DIAGNOSIS — I10 Essential (primary) hypertension: Secondary | ICD-10-CM

## 2023-10-22 DIAGNOSIS — I471 Supraventricular tachycardia, unspecified: Secondary | ICD-10-CM

## 2023-11-06 ENCOUNTER — Other Ambulatory Visit: Payer: Self-pay | Admitting: Physician Assistant

## 2023-11-06 DIAGNOSIS — E782 Mixed hyperlipidemia: Secondary | ICD-10-CM

## 2023-12-02 ENCOUNTER — Encounter: Payer: Self-pay | Admitting: Physician Assistant

## 2023-12-02 ENCOUNTER — Ambulatory Visit: Payer: Self-pay | Admitting: Physician Assistant

## 2023-12-02 VITALS — BP 121/72 | HR 69 | Resp 16 | Ht 66.0 in | Wt 166.8 lb

## 2023-12-02 DIAGNOSIS — F17210 Nicotine dependence, cigarettes, uncomplicated: Secondary | ICD-10-CM

## 2023-12-02 DIAGNOSIS — R011 Cardiac murmur, unspecified: Secondary | ICD-10-CM

## 2023-12-02 DIAGNOSIS — G8929 Other chronic pain: Secondary | ICD-10-CM

## 2023-12-02 DIAGNOSIS — R7303 Prediabetes: Secondary | ICD-10-CM | POA: Diagnosis not present

## 2023-12-02 DIAGNOSIS — I7300 Raynaud's syndrome without gangrene: Secondary | ICD-10-CM | POA: Diagnosis not present

## 2023-12-02 DIAGNOSIS — R252 Cramp and spasm: Secondary | ICD-10-CM

## 2023-12-02 DIAGNOSIS — I10 Essential (primary) hypertension: Secondary | ICD-10-CM

## 2023-12-02 DIAGNOSIS — I471 Supraventricular tachycardia, unspecified: Secondary | ICD-10-CM

## 2023-12-02 DIAGNOSIS — R5383 Other fatigue: Secondary | ICD-10-CM

## 2023-12-02 DIAGNOSIS — E7849 Other hyperlipidemia: Secondary | ICD-10-CM

## 2023-12-02 NOTE — Progress Notes (Signed)
 Established patient visit  Patient: Katelyn Bartlett   DOB: [birth year]   68 y.o. Female  MRN: [id number] Visit Date: 12/02/2023  Today's healthcare provider: Blane Bunting, PA-C   Chief Complaint  Patient presents with   Medical Management of Chronic Issues   Subjective       Discussed the use of AI scribe software for clinical note transcription with the patient, who gave verbal consent to proceed.  History of Present Illness Katelyn ANDREW "Stephenie Einstein" is a 68 year old female with hypertension and supraventricular tachycardia who presents for a routine follow-up and blood work.  She is compliant with her medication regimen, including 5 mg of Norvasc  and metoprolol , with well-controlled blood pressure. Neuropathy in her toes, previously diagnosed as Raynaud's syndrome, resolved after adjusting her medication. She takes atorvastatin  40 mg and omega-3 fish oil supplements twice daily, with improved cholesterol levels since switching from pravastatin. She suspects her high cholesterol may be genetic due to lack of family medical history.  She experiences increased fatigue, which does not interfere with work, and attributes it partly to back pain. She consults a neurosurgeon and pain management specialist and has received injections for back pain. 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 She experiences leg cramps at night, likely related to standing all day at work.  She has a history of supraventricular tachycardia and takes metoprolol . A previous echocardiogram showed slight leakage, described as a murmur. She denies current chest pain, shortness of breath, or palpitations but feels tired.  Her vision remains stable, using 'cheater glasses' for reading. She has a history of slight astigmatism and was previously nearsighted, now more farsighted with age. She has regular eye check-ups due to her husband's history of wet macular degeneration.  Socially, she continues to smoke at  the same rate and works full-time, standing throughout her workday.       12/02/2023    8:09 AM 06/03/2023    8:08 AM 01/22/2023    8:53 AM  Depression screen PHQ 2/9  Decreased Interest 0 0 0  Down, Depressed, Hopeless 0 0 0  PHQ - 2 Score 0 0 0  Altered sleeping  0   Tired, decreased energy  1   Change in appetite  0   Feeling bad or failure about yourself   0   Trouble concentrating  0   Moving slowly or fidgety/restless  0   Suicidal thoughts  0   PHQ-9 Score  1   Difficult doing work/chores  Not difficult at all       12/02/2023    8:09 AM 06/03/2023    8:08 AM  GAD 7 : Generalized Anxiety Score  Nervous, Anxious, on Edge  0  Control/stop worrying 0 0  Worry too much - different things 0 0  Trouble relaxing 0 0  Restless 0 0  Easily annoyed or irritable 0 0  Afraid - awful might happen 0 0  Total GAD 7 Score  0  Anxiety Difficulty Not difficult at all Not difficult at all    Medications: Outpatient Medications Prior to Visit  Medication Sig   amLODipine  (NORVASC ) 5 MG tablet TAKE 1 TABLET (5 MG TOTAL) BY MOUTH DAILY.   atorvastatin  (LIPITOR) 40 MG tablet TAKE 1 TABLET BY MOUTH EVERY DAY   HYDROcodone -acetaminophen  (NORCO/VICODIN) 5-325 MG tablet Take 1 tablet by mouth every 6 (six) hours as needed for severe pain (pain score 7-10).   metoprolol  tartrate (LOPRESSOR ) 25 MG tablet TAKE 1 TABLET  BY MOUTH TWICE A DAY   MULTIPLE VITAMIN PO Take 1 tablet by mouth daily.   omeprazole  (PRILOSEC) 20 MG capsule TAKE 1 CAPSULE BY MOUTH EVERY DAY   No facility-administered medications prior to visit.    Review of Systems All negative Except see HPI       Objective    BP 121/72 (BP Location: Left Arm, Patient Position: Sitting, Cuff Size: Normal)   Pulse 69   Resp 16   Ht 5\' 6"  (1.676 m)   Wt 166 lb 12.8 oz (75.7 kg)   SpO2 99%   BMI 26.92 kg/m     Physical Exam Vitals reviewed.  Constitutional:      General: She is not in acute distress.    Appearance:  Normal appearance. She is well-developed. She is not diaphoretic.  HENT:     Head: Normocephalic and atraumatic.  Eyes:     General: No scleral icterus.    Conjunctiva/sclera: Conjunctivae normal.  Neck:     Thyroid : No thyromegaly.  Cardiovascular:     Rate and Rhythm: Normal rate and regular rhythm.     Pulses: Normal pulses.     Heart sounds: Murmur heard.  Pulmonary:     Effort: Pulmonary effort is normal. No respiratory distress.     Breath sounds: Normal breath sounds. No wheezing, rhonchi or rales.  Musculoskeletal:     Cervical back: Neck supple.     Right lower leg: No edema.     Left lower leg: No edema.  Lymphadenopathy:     Cervical: No cervical adenopathy.  Skin:    General: Skin is warm and dry.     Findings: No rash.  Neurological:     Mental Status: She is alert and oriented to person, place, and time. Mental status is at baseline.  Psychiatric:        Mood and Affect: Mood normal.        Behavior: Behavior normal.      No results found for any visits on 12/02/23.      Assessment & Plan Supraventricular tachycardia Chronic and stable Managed with metoprolol . No symptoms of palpitations or chest pain. Slight heart murmur noted, possibly related to previous echocardiogram findings. - Perform EKG to evaluate heart murmur and assess cardiac status. - Continue metoprolol  as prescribed.  Heart murmur Possibly correlating with previous echocardiogram findings of "slight leakage." No new symptoms reported. EKG showed some st t wave abnormalities, nsr Will check troponin level  Raynaud's syndrome Symptoms resolved after medication adjustment. No current symptoms. Continue amlodipine  5 Will follow-up  Chronic back pain Managed with pain management and neurosurgical consultation. Surgery deferred. Reports increased fatigue, possibly related to chronic pain. - Continue current pain management regimen. - Follow up with pain management specialist as  scheduled.  Leg cramps Intermittent, primarily nocturnal, associated with prolonged standing. No significant change in frequency or severity. - Include additional blood work to evaluate potential causes of fatigue and leg cramps.  Hyperlipidemia Chronic and currently on atorvastatin  LDL was 57 but TGL 178 and hdl 37 Managed with atorvastatin  and omega-3 fish oil. Current regimen controls lipid levels. Genetic predisposition to hypercholesterolemia. - Continue atorvastatin  and omega-3 fish oil supplements.  Will follow-up  Essential hypertension (Primary) - Comprehensive metabolic panel with GFR - CBC with Differential/Platelet - Hemoglobin A1c   Other hyperlipidemia - Comprehensive metabolic panel with GFR - CBC with Differential/Platelet - Hemoglobin A1c - Lipid panel  Cigarette smoker Chronic, not planning to quit smoking Will reassess  at the next appointment Will follow-up  - Comprehensive metabolic panel with GFR - CBC with Differential/Platelet  Prediabetes - Hemoglobin A1c  Other chronic pain - Comprehensive metabolic panel with GFR - CBC with Differential/Platelet - Lipid panel   Heart murmur - EKG 12-Lead - Troponin T  Other fatigue - CK (Creatine Kinase) - B12 and Folate Panel - VITAMIN D 25 Hydroxy (Vit-D Deficiency, Fractures)   Orders Placed This Encounter  Procedures   Comprehensive metabolic panel with GFR    Has the patient fasted?:   Yes   CBC with Differential/Platelet   Hemoglobin A1c   Lipid panel    Has the patient fasted?:   Yes    No follow-ups on file.   The patient was advised to call back or seek an in-person evaluation if the symptoms worsen or if the condition fails to improve as anticipated.  I discussed the assessment and treatment plan with the patient. The patient was provided an opportunity to ask questions and all were answered. The patient agreed with the plan and demonstrated an understanding of the instructions.  I,  Yitty [place], PA-C have reviewed all documentation for this visit. The documentation on 12/02/2023  for the exam, diagnosis, procedures, and orders are all accurate and complete.  Blane Bunting, [facility], MMS [facility] [phone] (phone) [phone] (fax)  [facility]

## 2023-12-03 LAB — COMPREHENSIVE METABOLIC PANEL WITH GFR
ALT: 26 IU/L (ref 0–32)
AST: 24 IU/L (ref 0–40)
Albumin: 4.7 g/dL (ref 3.9–4.9)
Alkaline Phosphatase: 90 IU/L (ref 44–121)
BUN/Creatinine Ratio: 26 (ref 12–28)
BUN: 16 mg/dL (ref 8–27)
Bilirubin Total: 0.4 mg/dL (ref 0.0–1.2)
CO2: 21 mmol/L (ref 20–29)
Calcium: 9.5 mg/dL (ref 8.7–10.3)
Chloride: 103 mmol/L (ref 96–106)
Creatinine, Ser: 0.61 mg/dL (ref 0.57–1.00)
Globulin, Total: 2.1 g/dL (ref 1.5–4.5)
Glucose: 101 mg/dL — ABNORMAL HIGH (ref 70–99)
Potassium: 4.4 mmol/L (ref 3.5–5.2)
Sodium: 141 mmol/L (ref 134–144)
Total Protein: 6.8 g/dL (ref 6.0–8.5)
eGFR: 97 mL/min/{1.73_m2} (ref >59)

## 2023-12-03 LAB — HEMOGLOBIN A1C
Est. average glucose Bld gHb Est-mCnc: 123 mg/dL
Hgb A1c MFr Bld: 5.9 % — ABNORMAL HIGH (ref 4.8–5.6)

## 2023-12-03 LAB — CBC WITH DIFFERENTIAL/PLATELET
Basophils Absolute: 0.1 10*3/uL (ref 0.0–0.2)
Basos: 1 %
EOS (ABSOLUTE): 0.1 10*3/uL (ref 0.0–0.4)
Eos: 2 %
Hematocrit: 46.7 % — ABNORMAL HIGH (ref 34.0–46.6)
Hemoglobin: 15.8 g/dL (ref 11.1–15.9)
Immature Grans (Abs): 0 10*3/uL (ref 0.0–0.1)
Immature Granulocytes: 0 %
Lymphocytes Absolute: 2.3 10*3/uL (ref 0.7–3.1)
Lymphs: 31 %
MCH: 31.5 pg (ref 26.6–33.0)
MCHC: 33.8 g/dL (ref 31.5–35.7)
MCV: 93 fL (ref 79–97)
Monocytes Absolute: 0.7 10*3/uL (ref 0.1–0.9)
Monocytes: 9 %
Neutrophils Absolute: 4.2 10*3/uL (ref 1.4–7.0)
Neutrophils: 57 %
Platelets: 265 10*3/uL (ref 150–450)
RBC: 5.01 x10E6/uL (ref 3.77–5.28)
RDW: 12.9 % (ref 11.7–15.4)
WBC: 7.3 10*3/uL (ref 3.4–10.8)

## 2023-12-03 LAB — B12 AND FOLATE PANEL
Folate: 12.9 ng/mL (ref >3.0)
Vitamin B-12: >2000 pg/mL — ABNORMAL HIGH (ref 232–1245)

## 2023-12-03 LAB — VITAMIN D 25 HYDROXY (VIT D DEFICIENCY, FRACTURES): Vit D, 25-Hydroxy: 46.5 ng/mL (ref 30.0–100.0)

## 2023-12-03 LAB — LIPID PANEL
Chol/HDL Ratio: 3.2 ratio (ref 0.0–4.4)
Cholesterol, Total: 115 mg/dL (ref 100–199)
HDL: 36 mg/dL — ABNORMAL LOW (ref >39)
LDL Chol Calc (NIH): 53 mg/dL (ref 0–99)
Triglycerides: 150 mg/dL — ABNORMAL HIGH (ref 0–149)
VLDL Cholesterol Cal: 26 mg/dL (ref 5–40)

## 2023-12-03 LAB — CK: Total CK: 49 U/L (ref 32–182)

## 2023-12-03 LAB — TROPONIN T: Troponin T (Highly Sensitive): <6 ng/L (ref 0–14)

## 2023-12-04 DIAGNOSIS — M4310 Spondylolisthesis, site unspecified: Secondary | ICD-10-CM | POA: Diagnosis not present

## 2023-12-04 DIAGNOSIS — F1120 Opioid dependence, uncomplicated: Secondary | ICD-10-CM | POA: Diagnosis not present

## 2023-12-08 ENCOUNTER — Ambulatory Visit: Payer: Self-pay | Admitting: Physician Assistant

## 2023-12-16 NOTE — Telephone Encounter (Signed)
 FYI please see the pt message below

## 2023-12-30 ENCOUNTER — Encounter: Payer: Self-pay | Admitting: Cardiology

## 2023-12-30 ENCOUNTER — Ambulatory Visit: Attending: Cardiology | Admitting: Cardiology

## 2023-12-30 VITALS — BP 130/80 | HR 82 | Ht 66.0 in | Wt 164.4 lb

## 2023-12-30 DIAGNOSIS — R000 Tachycardia, unspecified: Secondary | ICD-10-CM | POA: Diagnosis not present

## 2023-12-30 DIAGNOSIS — F17200 Nicotine dependence, unspecified, uncomplicated: Secondary | ICD-10-CM

## 2023-12-30 DIAGNOSIS — E782 Mixed hyperlipidemia: Secondary | ICD-10-CM | POA: Diagnosis not present

## 2023-12-30 DIAGNOSIS — I38 Endocarditis, valve unspecified: Secondary | ICD-10-CM

## 2023-12-30 NOTE — Progress Notes (Signed)
 Cardiology Office Note:    Date:  12/30/2023   ID:  Katelyn Bartlett, DOB Jul 26, 1955, MRN [id number]  PCP:  Blane Bunting, PA-C   [facility] Providers Cardiologist:  None     Referring MD: Blane Bunting, PA-C   Chief Complaint  Patient presents with   New Patient (Initial Visit)    Ref by Blane Bunting, PA-C for evaluation of heart murmur, HTN & SVT. Patient c/o left ankle swelling most days & has racing heart beats at times but the Metoprolol  seems to relieve the rapid beats most days. Denies chest pain or shortness of breath.     History of Present Illness:    Katelyn Bartlett is a 68 y.o. female with a hx of hyperlipidemia, tachycardia, current smoker x 30+ years, Raynaud's syndrome who presents due to cardiac murmur.  Patient moved to the area from [place]  about 16 years ago.  Was diagnosed with baseline tachycardia while in [place] , started on metoprolol  with good effect.  Also diagnosed with Raynaud's phenomenon, started on amlodipine  5 mg daily with resolution of symptoms.  Had an echocardiogram 16 years ago, was told she has a leaky valve.  Follow-up with PCP last month, was told she may have a slight murmur.  She denies chest pain or shortness currently smokes, is working on quitting.  Past Medical History:  Diagnosis Date   Abnormal weight gain 01/16/2015   Hypercholesteremia    Tachycardia     Past Surgical History:  Procedure Laterality Date   ABDOMINAL HYSTERECTOMY  1990   CHOLECYSTECTOMY  1989   COLONOSCOPY WITH PROPOFOL  N/A 01/12/2020   Procedure: COLONOSCOPY WITH PROPOFOL ;  Surgeon: Selena Daily, MD;  Location: [facility] ENDOSCOPY;  Service: Gastroenterology;  Laterality: N/A;   ESOPHAGOGASTRODUODENOSCOPY (EGD) WITH PROPOFOL  N/A 01/12/2020   Procedure: ESOPHAGOGASTRODUODENOSCOPY (EGD) WITH PROPOFOL ;  Surgeon: Selena Daily, MD;  Location: [facility] ENDOSCOPY;  Service: Gastroenterology;  Laterality: N/A;   ROTATOR CUFF REPAIR Right 2007   SKIN  CANCER EXCISION  2011   ABOVE LIP    Current Medications: Current Meds  Medication Sig   amLODipine  (NORVASC ) 5 MG tablet TAKE 1 TABLET (5 MG TOTAL) BY MOUTH DAILY.   atorvastatin  (LIPITOR) 40 MG tablet TAKE 1 TABLET BY MOUTH EVERY DAY   HYDROcodone -acetaminophen  (NORCO/VICODIN) 5-325 MG tablet Take 1 tablet by mouth every 6 (six) hours as needed for severe pain (pain score 7-10).   metoprolol  tartrate (LOPRESSOR ) 25 MG tablet TAKE 1 TABLET BY MOUTH TWICE A DAY   MULTIPLE VITAMIN PO Take 1 tablet by mouth daily.   omeprazole  (PRILOSEC) 20 MG capsule TAKE 1 CAPSULE BY MOUTH EVERY DAY     Allergies:   Doxycycline hyclate and Morphine sulfate   Social History   Socioeconomic History   Marital status: Widowed    Spouse name: Not on file   Number of children: Not on file   Years of education: Not on file   Highest education level: 12th grade  Occupational History   Not on file  Tobacco Use   Smoking status: Every Day    Current packs/day: 0.50    Average packs/day: 0.5 packs/day for 30.0 years (15.0 ttl pk-yrs)    Types: Cigarettes   Smokeless tobacco: Never  Vaping Use   Vaping status: Never Used  Substance and Sexual Activity   Alcohol use: No    Alcohol/week: 0.0 standard drinks of alcohol   Drug use: No   Sexual activity: Not on file  Other Topics Concern  Not on file  Social History Narrative   Not on file   Social Drivers of Health   Financial Resource Strain: Low Risk  (11/28/2023)   Overall Financial Resource Strain (CARDIA)    Difficulty of Paying Living Expenses: Not hard at all  Food Insecurity: No Food Insecurity (11/28/2023)   Hunger Vital Sign    Worried About Running Out of Food in the Last Year: Never true    Ran Out of Food in the Last Year: Never true  Transportation Needs: No Transportation Needs (11/28/2023)   PRAPARE - Administrator, Civil Service (Medical): No    Lack of Transportation (Non-Medical): No  Physical Activity: Unknown  (11/28/2023)   Exercise Vital Sign    Days of Exercise per Week: 0 days    Minutes of Exercise per Session: Not on file  Stress: No Stress Concern Present (11/28/2023)   [employer] of Occupational Health - Occupational Stress Questionnaire    Feeling of Stress : Not at all  Social Connections: Socially Isolated (11/28/2023)   Social Connection and Isolation Panel [NHANES]    Frequency of Communication with Friends and Family: Once a week    Frequency of Social Gatherings with Friends and Family: Once a week    Attends Religious Services: Never    Database administrator or Organizations: No    Attends Engineer, structural: Not on file    Marital Status: Widowed     Family History: The patient's She was adopted. Family history is unknown by patient.  ROS:   Please see the history of present illness.     All other systems reviewed and are negative.  EKGs/Labs/Other Studies Reviewed:    The following studies were reviewed today:  EKG Interpretation Date/Time:  Tuesday December 30 2023 09:01:22 EDT Ventricular Rate:  82 PR Interval:  158 QRS Duration:  86 QT Interval:  380 QTC Calculation: 443 R Axis:   33  Text Interpretation: Normal sinus rhythm Possible Left atrial enlargement ST & T wave abnormality, consider inferior ischemia Confirmed by Constancia Delton ([id number]) on 12/30/2023 9:09:46 AM    Recent Labs: 01/22/2023: Magnesium 2.0 12/02/2023: ALT 26; BUN 16; Creatinine, Ser 0.61; Hemoglobin 15.8; Platelets 265; Potassium 4.4; Sodium 141  Recent Lipid Panel    Component Value Date/Time   CHOL 115 12/02/2023 0905   TRIG 150 (H) 12/02/2023 0905   HDL 36 (L) 12/02/2023 0905   CHOLHDL 3.2 12/02/2023 0905   LDLCALC 53 12/02/2023 0905     Risk Assessment/Calculations:               Physical Exam:    VS:  BP 130/80 (BP Location: Right Arm, Patient Position: Sitting, Cuff Size: Normal)   Pulse 82   Ht 5\' 6"  (1.676 m)   Wt 164 lb 6 oz (74.6 kg)   SpO2 98%    BMI 26.53 kg/m     Wt Readings from Last 3 Encounters:  12/30/23 164 lb 6 oz (74.6 kg)  12/02/23 166 lb 12.8 oz (75.7 kg)  06/03/23 163 lb (73.9 kg)     GEN:  Well nourished, well developed in no acute distress HEENT: Normal NECK: No JVD; No carotid bruits CARDIAC: RRR, no murmurs, rubs, gallops RESPIRATORY:  Clear to auscultation without rales, wheezing or rhonchi  ABDOMEN: Soft, non-tender, non-distended MUSCULOSKELETAL:  No edema; No deformity  SKIN: Warm and dry NEUROLOGIC:  Alert and oriented x 3 PSYCHIATRIC:  Normal affect   ASSESSMENT:  1. Valvular regurgitation   2. Tachycardia   3. Mixed hyperlipidemia   4. Current smoker    PLAN:    In order of problems listed above:  History of cardiac valve regurgitation, no significant murmur noted on exam.  Repeat echo to evaluate any significant structural or valvular abnormalities. History of tachycardia, possibly inappropriate sinus tach.  Heart rates controlled with metoprolol .  Continue Lopressor  25 mg twice daily. Hyperlipidemia, continue Lipitor 40 mg daily. Current smoker, smoking cessation advised.  Follow-up after echocardiogram.     Medication Adjustments/Labs and Tests Ordered: Current medicines are reviewed at length with the patient today.  Concerns regarding medicines are outlined above.  Orders Placed This Encounter  Procedures   EKG 12-Lead   ECHOCARDIOGRAM COMPLETE   No orders of the defined types were placed in this encounter.   Patient Instructions  Medication Instructions:  Your physician recommends that you continue on your current medications as directed. Please refer to the Current Medication list given to you today.   *If you need a refill on your cardiac medications before your next appointment, please call your pharmacy*  Lab Work: No labs ordered today  If you have labs (blood work) drawn today and your tests are completely normal, you will receive your results only by: MyChart  Message (if you have MyChart) OR A paper copy in the mail If you have any lab test that is abnormal or we need to change your treatment, we will call you to review the results.  Testing/Procedures: Your physician has requested that you have an echocardiogram. Echocardiography is a painless test that uses sound waves to create images of your heart. It provides your doctor with information about the size and shape of your heart and how well your heart's chambers and valves are working.   You may receive an ultrasound enhancing agent through an IV if needed to better visualize your heart during the echo. This procedure takes approximately one hour.  There are no restrictions for this procedure.  This will take place at [address] [facility] [address] [facility]) [address], [place] [address]  Please note: We ask at that you not bring children with you during ultrasound (echo/ vascular) testing. Due to room size and safety concerns, children are not allowed in the ultrasound rooms during exams. Our front office staff cannot provide observation of children in our lobby area while testing is being conducted. An adult accompanying a patient to their appointment will only be allowed in the ultrasound room at the discretion of the ultrasound technician under special circumstances. We apologize for any inconvenience.   Follow-Up: At [facility], you and your health needs are our priority.  As part of our continuing mission to provide you with exceptional heart care, our providers are all part of one team.  This team includes your primary Cardiologist (physician) and Advanced Practice Providers or APPs (Physician Assistants and Nurse Practitioners) who all work together to provide you with the care you need, when you need it.  Your next appointment:   2 month(s)  Provider:   You may see Dr. Junnie Olives or one of the following Advanced Practice Providers on your designated Care Team:   Laneta Pintos, NP Gildardo Labrador, PA-C Varney Gentleman, PA-C Cadence [place], PA-C Ronald Cockayne, NP Morey Ar, NP    We recommend signing up for the patient portal called "MyChart".  Sign up information is provided on this After Visit Summary.  MyChart is used to connect with patients  for Virtual Visits (Telemedicine).  Patients are able to view lab/test results, encounter notes, upcoming appointments, etc.  Non-urgent messages can be sent to your provider as well.   To learn more about what you can do with MyChart, go to [email or web address].         Signed, Constancia Delton, MD  12/30/2023 9:58 AM    [facility]

## 2023-12-30 NOTE — Patient Instructions (Signed)
 Medication Instructions:  Your physician recommends that you continue on your current medications as directed. Please refer to the Current Medication list given to you today.   *If you need a refill on your cardiac medications before your next appointment, please call your pharmacy*  Lab Work: No labs ordered today  If you have labs (blood work) drawn today and your tests are completely normal, you will receive your results only by: MyChart Message (if you have MyChart) OR A paper copy in the mail If you have any lab test that is abnormal or we need to change your treatment, we will call you to review the results.  Testing/Procedures: Your physician has requested that you have an echocardiogram. Echocardiography is a painless test that uses sound waves to create images of your heart. It provides your doctor with information about the size and shape of your heart and how well your heart's chambers and valves are working.   You may receive an ultrasound enhancing agent through an IV if needed to better visualize your heart during the echo. This procedure takes approximately one hour.  There are no restrictions for this procedure.  This will take place at [address] [facility] [address]) [address], [place] [address]  Please note: We ask at that you not bring children with you during ultrasound (echo/ vascular) testing. Due to room size and safety concerns, children are not allowed in the ultrasound rooms during exams. Our front office staff cannot provide observation of children in our lobby area while testing is being conducted. An adult accompanying a patient to their appointment will only be allowed in the ultrasound room at the discretion of the ultrasound technician under special circumstances. We apologize for any inconvenience.   Follow-Up: At [facility], you and your health needs are our priority.  As part of our continuing mission to provide you with exceptional heart  care, our providers are all part of one team.  This team includes your primary Cardiologist (physician) and Advanced Practice Providers or APPs (Physician Assistants and Nurse Practitioners) who all work together to provide you with the care you need, when you need it.  Your next appointment:   2 month(s)  Provider:   You may see Dr. Junnie Olives or one of the following Advanced Practice Providers on your designated Care Team:   Laneta Pintos, NP Gildardo Labrador, PA-C Varney Gentleman, PA-C Cadence Ettrick, PA-C Ronald Cockayne, NP Morey Ar, NP    We recommend signing up for the patient portal called "MyChart".  Sign up information is provided on this After Visit Summary.  MyChart is used to connect with patients for Virtual Visits (Telemedicine).  Patients are able to view lab/test results, encounter notes, upcoming appointments, etc.  Non-urgent messages can be sent to your provider as well.   To learn more about what you can do with MyChart, go to [email or web address].

## 2024-01-15 ENCOUNTER — Ambulatory Visit: Attending: Cardiology

## 2024-01-15 DIAGNOSIS — I38 Endocarditis, valve unspecified: Secondary | ICD-10-CM | POA: Diagnosis not present

## 2024-01-16 LAB — ECHOCARDIOGRAM COMPLETE
AR max vel: 3.15 cm2
AV Area VTI: 3.05 cm2
AV Area mean vel: 3.08 cm2
AV Mean grad: 2 mmHg
AV Peak grad: 3.9 mmHg
Ao pk vel: 0.99 m/s
Area-P 1/2: 3.72 cm2
S' Lateral: 2.97 cm

## 2024-01-18 ENCOUNTER — Ambulatory Visit: Payer: Self-pay | Admitting: Cardiology

## 2024-01-18 ENCOUNTER — Other Ambulatory Visit: Payer: Self-pay | Admitting: Physician Assistant

## 2024-01-18 DIAGNOSIS — K2100 Gastro-esophageal reflux disease with esophagitis, without bleeding: Secondary | ICD-10-CM

## 2024-02-02 ENCOUNTER — Other Ambulatory Visit: Payer: Self-pay | Admitting: Physician Assistant

## 2024-02-02 DIAGNOSIS — E782 Mixed hyperlipidemia: Secondary | ICD-10-CM

## 2024-02-26 DIAGNOSIS — D2271 Melanocytic nevi of right lower limb, including hip: Secondary | ICD-10-CM | POA: Diagnosis not present

## 2024-02-26 DIAGNOSIS — L821 Other seborrheic keratosis: Secondary | ICD-10-CM | POA: Diagnosis not present

## 2024-02-26 DIAGNOSIS — D225 Melanocytic nevi of trunk: Secondary | ICD-10-CM | POA: Diagnosis not present

## 2024-02-26 DIAGNOSIS — D2262 Melanocytic nevi of left upper limb, including shoulder: Secondary | ICD-10-CM | POA: Diagnosis not present

## 2024-02-26 DIAGNOSIS — Z08 Encounter for follow-up examination after completed treatment for malignant neoplasm: Secondary | ICD-10-CM | POA: Diagnosis not present

## 2024-02-26 DIAGNOSIS — L932 Other local lupus erythematosus: Secondary | ICD-10-CM | POA: Diagnosis not present

## 2024-02-26 DIAGNOSIS — D2261 Melanocytic nevi of right upper limb, including shoulder: Secondary | ICD-10-CM | POA: Diagnosis not present

## 2024-02-26 DIAGNOSIS — Z85828 Personal history of other malignant neoplasm of skin: Secondary | ICD-10-CM | POA: Diagnosis not present

## 2024-02-26 DIAGNOSIS — D2272 Melanocytic nevi of left lower limb, including hip: Secondary | ICD-10-CM | POA: Diagnosis not present

## 2024-02-26 DIAGNOSIS — D692 Other nonthrombocytopenic purpura: Secondary | ICD-10-CM | POA: Diagnosis not present

## 2024-03-01 ENCOUNTER — Other Ambulatory Visit: Payer: Self-pay | Admitting: Physician Assistant

## 2024-03-01 DIAGNOSIS — I10 Essential (primary) hypertension: Secondary | ICD-10-CM

## 2024-03-01 DIAGNOSIS — I471 Supraventricular tachycardia, unspecified: Secondary | ICD-10-CM

## 2024-03-02 DIAGNOSIS — M461 Sacroiliitis, not elsewhere classified: Secondary | ICD-10-CM | POA: Diagnosis not present

## 2024-03-02 DIAGNOSIS — M4310 Spondylolisthesis, site unspecified: Secondary | ICD-10-CM | POA: Diagnosis not present

## 2024-03-02 DIAGNOSIS — F1120 Opioid dependence, uncomplicated: Secondary | ICD-10-CM | POA: Diagnosis not present

## 2024-03-05 ENCOUNTER — Ambulatory Visit: Admitting: Cardiology

## 2024-06-01 ENCOUNTER — Other Ambulatory Visit: Payer: Self-pay

## 2024-06-01 ENCOUNTER — Telehealth: Payer: Self-pay | Admitting: Physician Assistant

## 2024-06-01 DIAGNOSIS — E782 Mixed hyperlipidemia: Secondary | ICD-10-CM

## 2024-06-01 MED ORDER — ATORVASTATIN CALCIUM 40 MG PO TABS: 40.0000 mg | ORAL_TABLET | Freq: Every day | ORAL | 0 refills | Status: DC

## 2024-06-01 NOTE — Telephone Encounter (Signed)
 Patient is visiting son in [place]  and needs Atorvastatin  40 mg. #90 sent to [facility] in [place] , [place]  Store # [phone]

## 2024-06-01 NOTE — Telephone Encounter (Signed)
 Medication has been sent in

## 2024-06-18 ENCOUNTER — Telehealth: Payer: Self-pay

## 2024-06-20 DIAGNOSIS — M25562 Pain in left knee: Secondary | ICD-10-CM | POA: Diagnosis not present

## 2024-06-20 DIAGNOSIS — Z885 Allergy status to narcotic agent status: Secondary | ICD-10-CM | POA: Diagnosis not present

## 2024-06-20 DIAGNOSIS — M25462 Effusion, left knee: Secondary | ICD-10-CM | POA: Diagnosis not present

## 2024-06-20 DIAGNOSIS — F17210 Nicotine dependence, cigarettes, uncomplicated: Secondary | ICD-10-CM | POA: Diagnosis not present

## 2024-06-20 DIAGNOSIS — I10 Essential (primary) hypertension: Secondary | ICD-10-CM | POA: Diagnosis not present

## 2024-06-20 DIAGNOSIS — Z881 Allergy status to other antibiotic agents status: Secondary | ICD-10-CM | POA: Diagnosis not present

## 2024-06-20 DIAGNOSIS — M1712 Unilateral primary osteoarthritis, left knee: Secondary | ICD-10-CM | POA: Diagnosis not present

## 2024-06-20 DIAGNOSIS — S8992XA Unspecified injury of left lower leg, initial encounter: Secondary | ICD-10-CM | POA: Diagnosis not present

## 2024-06-20 DIAGNOSIS — W010XXA Fall on same level from slipping, tripping and stumbling without subsequent striking against object, initial encounter: Secondary | ICD-10-CM | POA: Diagnosis not present

## 2024-06-22 NOTE — Telephone Encounter (Signed)
 Please disregard the previous message. All your chronic conditions were covered this year by your PCP.  Please plan to be seen as you were instructed at your last visit.  [facility]

## 2024-08-27 ENCOUNTER — Other Ambulatory Visit: Payer: Self-pay | Admitting: Physician Assistant

## 2024-08-27 DIAGNOSIS — E782 Mixed hyperlipidemia: Secondary | ICD-10-CM
